# Patient Record
Sex: Male | Born: 1960 | Race: Black or African American | Hispanic: No | Marital: Single | Smoking: Former smoker
Health system: Southern US, Community
[De-identification: ages and names within clinical notes are randomized; demographics above are authoritative.]

## PROBLEM LIST (undated history)

## (undated) DIAGNOSIS — F1110 Opioid abuse, uncomplicated: Secondary | ICD-10-CM

## (undated) DIAGNOSIS — M549 Dorsalgia, unspecified: Secondary | ICD-10-CM

## (undated) DIAGNOSIS — Z720 Tobacco use: Secondary | ICD-10-CM

## (undated) DIAGNOSIS — G8929 Other chronic pain: Secondary | ICD-10-CM

## (undated) DIAGNOSIS — I10 Essential (primary) hypertension: Secondary | ICD-10-CM

## (undated) DIAGNOSIS — F1120 Opioid dependence, uncomplicated: Secondary | ICD-10-CM

## (undated) DIAGNOSIS — F1210 Cannabis abuse, uncomplicated: Secondary | ICD-10-CM

## (undated) DIAGNOSIS — K469 Unspecified abdominal hernia without obstruction or gangrene: Secondary | ICD-10-CM

## (undated) HISTORY — PX: HERNIA REPAIR: SHX51

## (undated) HISTORY — DX: Essential (primary) hypertension: I10

---

## 2008-06-23 ENCOUNTER — Emergency Department (HOSPITAL_COMMUNITY): Admission: EM | Admit: 2008-06-23 | Discharge: 2008-06-24 | Payer: Self-pay | Admitting: Emergency Medicine

## 2010-04-14 ENCOUNTER — Emergency Department (HOSPITAL_COMMUNITY)
Admission: EM | Admit: 2010-04-14 | Discharge: 2010-04-14 | Disposition: A | Discharge status: Home / Self Care | Payer: Self-pay | Attending: Emergency Medicine | Admitting: Emergency Medicine

## 2010-04-14 DIAGNOSIS — M545 Low back pain, unspecified: Secondary | ICD-10-CM | POA: Insufficient documentation

## 2010-04-14 DIAGNOSIS — G8929 Other chronic pain: Secondary | ICD-10-CM | POA: Insufficient documentation

## 2010-04-14 DIAGNOSIS — M542 Cervicalgia: Secondary | ICD-10-CM | POA: Insufficient documentation

## 2010-04-14 DIAGNOSIS — M5430 Sciatica, unspecified side: Secondary | ICD-10-CM | POA: Insufficient documentation

## 2010-04-14 DIAGNOSIS — M25559 Pain in unspecified hip: Secondary | ICD-10-CM | POA: Insufficient documentation

## 2010-05-26 ENCOUNTER — Emergency Department (HOSPITAL_COMMUNITY)
Admission: EM | Admit: 2010-05-26 | Discharge: 2010-05-26 | Disposition: A | Discharge status: Home / Self Care | Payer: Self-pay | Attending: Emergency Medicine | Admitting: Emergency Medicine

## 2010-05-26 DIAGNOSIS — M5430 Sciatica, unspecified side: Secondary | ICD-10-CM | POA: Insufficient documentation

## 2010-05-26 DIAGNOSIS — G8929 Other chronic pain: Secondary | ICD-10-CM | POA: Insufficient documentation

## 2010-05-26 DIAGNOSIS — IMO0002 Reserved for concepts with insufficient information to code with codable children: Secondary | ICD-10-CM | POA: Insufficient documentation

## 2010-05-26 DIAGNOSIS — M546 Pain in thoracic spine: Secondary | ICD-10-CM | POA: Insufficient documentation

## 2010-05-26 DIAGNOSIS — M545 Low back pain, unspecified: Secondary | ICD-10-CM | POA: Insufficient documentation

## 2010-05-26 DIAGNOSIS — M2550 Pain in unspecified joint: Secondary | ICD-10-CM | POA: Insufficient documentation

## 2010-05-29 LAB — URINALYSIS, ROUTINE W REFLEX MICROSCOPIC
Glucose, UA: NEGATIVE mg/dL
Hgb urine dipstick: NEGATIVE
Ketones, ur: NEGATIVE mg/dL
Specific Gravity, Urine: 1.038 — ABNORMAL HIGH (ref 1.005–1.030)
pH: 6 (ref 5.0–8.0)

## 2010-05-29 LAB — DIFFERENTIAL
Eosinophils Absolute: 0.2 10*3/uL (ref 0.0–0.7)
Eosinophils Relative: 3 % (ref 0–5)
Lymphs Abs: 2 10*3/uL (ref 0.7–4.0)
Monocytes Absolute: 0.5 10*3/uL (ref 0.1–1.0)
Monocytes Relative: 6 % (ref 3–12)

## 2010-05-29 LAB — COMPREHENSIVE METABOLIC PANEL
ALT: 23 U/L (ref 0–53)
AST: 21 U/L (ref 0–37)
Albumin: 4.1 g/dL (ref 3.5–5.2)
CO2: 27 mEq/L (ref 19–32)
Calcium: 8.8 mg/dL (ref 8.4–10.5)
GFR calc Af Amer: >60 mL/min (ref >60)
GFR calc non Af Amer: >60 mL/min (ref >60)
Sodium: 140 mEq/L (ref 135–145)

## 2010-05-29 LAB — CBC
MCHC: 33.6 g/dL (ref 30.0–36.0)
Platelets: 269 10*3/uL (ref 150–400)
RBC: 5.01 MIL/uL (ref 4.22–5.81)
WBC: 7.6 10*3/uL (ref 4.0–10.5)

## 2010-05-29 LAB — LIPASE, BLOOD: Lipase: 18 U/L (ref 11–59)

## 2010-07-09 ENCOUNTER — Emergency Department (HOSPITAL_COMMUNITY)
Admission: EM | Admit: 2010-07-09 | Discharge: 2010-07-09 | Disposition: A | Discharge status: Home / Self Care | Payer: Self-pay | Attending: Emergency Medicine | Admitting: Emergency Medicine

## 2010-07-09 DIAGNOSIS — M79609 Pain in unspecified limb: Secondary | ICD-10-CM | POA: Insufficient documentation

## 2010-07-09 DIAGNOSIS — IMO0001 Reserved for inherently not codable concepts without codable children: Secondary | ICD-10-CM | POA: Insufficient documentation

## 2010-07-09 DIAGNOSIS — M549 Dorsalgia, unspecified: Secondary | ICD-10-CM | POA: Insufficient documentation

## 2010-11-28 ENCOUNTER — Emergency Department (HOSPITAL_COMMUNITY)
Admission: EM | Admit: 2010-11-28 | Discharge: 2010-11-28 | Disposition: A | Discharge status: Home / Self Care | Payer: Self-pay | Attending: Emergency Medicine | Admitting: Emergency Medicine

## 2010-11-28 DIAGNOSIS — M545 Low back pain, unspecified: Secondary | ICD-10-CM | POA: Insufficient documentation

## 2010-11-28 DIAGNOSIS — G8929 Other chronic pain: Secondary | ICD-10-CM | POA: Insufficient documentation

## 2010-11-28 DIAGNOSIS — R29898 Other symptoms and signs involving the musculoskeletal system: Secondary | ICD-10-CM | POA: Insufficient documentation

## 2010-11-28 DIAGNOSIS — M533 Sacrococcygeal disorders, not elsewhere classified: Secondary | ICD-10-CM | POA: Insufficient documentation

## 2010-11-28 DIAGNOSIS — M79609 Pain in unspecified limb: Secondary | ICD-10-CM | POA: Insufficient documentation

## 2011-03-01 ENCOUNTER — Encounter (HOSPITAL_COMMUNITY): Payer: Self-pay | Admitting: *Deleted

## 2011-03-01 ENCOUNTER — Emergency Department (HOSPITAL_COMMUNITY)
Admission: EM | Admit: 2011-03-01 | Discharge: 2011-03-01 | Disposition: A | Discharge status: Home / Self Care | Payer: Self-pay | Attending: Emergency Medicine | Admitting: Emergency Medicine

## 2011-03-01 DIAGNOSIS — F17200 Nicotine dependence, unspecified, uncomplicated: Secondary | ICD-10-CM | POA: Insufficient documentation

## 2011-03-01 DIAGNOSIS — M545 Low back pain, unspecified: Secondary | ICD-10-CM | POA: Insufficient documentation

## 2011-03-01 DIAGNOSIS — M79609 Pain in unspecified limb: Secondary | ICD-10-CM | POA: Insufficient documentation

## 2011-03-01 DIAGNOSIS — M549 Dorsalgia, unspecified: Secondary | ICD-10-CM

## 2011-03-01 HISTORY — DX: Other chronic pain: G89.29

## 2011-03-01 HISTORY — DX: Dorsalgia, unspecified: M54.9

## 2011-03-01 MED ORDER — DIAZEPAM 5 MG PO TABS: 5.0000 mg | ORAL_TABLET | Freq: Every day | ORAL | Status: AC

## 2011-03-01 MED ORDER — IBUPROFEN 800 MG PO TABS: 800.0000 mg | ORAL_TABLET | Freq: Three times a day (TID) | ORAL | Status: AC

## 2011-03-01 MED ORDER — IBUPROFEN 800 MG PO TABS
800.0000 mg | ORAL_TABLET | Freq: Once | ORAL | Status: AC
  Administered 2011-03-01: 800 mg via ORAL
  Filled 2011-03-01: qty 1

## 2011-03-01 MED ORDER — HYDROCODONE-ACETAMINOPHEN 5-325 MG PO TABS: 1.0000 | ORAL_TABLET | Freq: Four times a day (QID) | ORAL | Status: AC | PRN

## 2011-03-01 NOTE — ED Notes (Signed)
C/o lower back pain that he has had since 2007 when he fell off a truck

## 2011-03-01 NOTE — ED Provider Notes (Signed)
History     CSN: [id number]  Arrival date & time 03/01/11  2159   First MD Initiated Contact with Patient 03/01/11 2300      Chief Complaint  Patient presents with  . Back Pain    (Consider location/radiation/quality/duration/timing/severity/associated sxs/prior treatment) HPI Patient notes that he has back pain "always" and that today, ~6hr pta, the pain became insidiously worse w increasing distribution.  The pain is focally about the left lower back w radiation inferiorly down the posterior of the ipsilateral leg.  Today the radiation seemed to be more inferior than in the past, and the "burning, cramping" pain was worse.  No attempts at analgesia, and the pain was worse w motion.  No inability to ambulate, no bowel or bladder changes, no distal (new) dysesthesia, No f/c, no abd pain/n/v/d. Past Medical History  Diagnosis Date  . Back pain, chronic     History reviewed. No pertinent past surgical history.  History reviewed. No pertinent family history.  History  Substance Use Topics  . Smoking status: Current Everyday Smoker  . Smokeless tobacco: Not on file  . Alcohol Use: No      Review of Systems  All other systems reviewed and are negative.    Allergies  Review of patient's allergies indicates no known allergies.  Home Medications   Current Outpatient Rx  Name Route Sig Dispense Refill  . NAPROXEN SODIUM 220 MG PO TABS Oral Take 220 mg by mouth 2 (two) times daily with a meal.    . DIAZEPAM 5 MG PO TABS Oral Take 1 tablet (5 mg total) by mouth at bedtime. 5 tablet 0  . HYDROCODONE-ACETAMINOPHEN 5-325 MG PO TABS Oral Take 1 tablet by mouth every 6 (six) hours as needed for pain. 12 tablet 0  . IBUPROFEN 800 MG PO TABS Oral Take 1 tablet (800 mg total) by mouth every 8 (eight) hours. 12 tablet 0    BP 146/85  Pulse 63  Temp(Src) 97.8 F (36.6 C) (Oral)  Resp 20  Ht 6' (1.829 m)  Wt 237 lb (107.502 kg)  BMI 32.14 kg/m2  SpO2 98%  Physical Exam    Nursing note and vitals reviewed. Constitutional: He is oriented to person, place, and time. He appears well-developed. No distress.  HENT:  Head: Normocephalic and atraumatic.  Eyes: Conjunctivae and EOM are normal.  Cardiovascular: Normal rate and regular rhythm.   Pulmonary/Chest: Effort normal. No stridor. No respiratory distress.  Abdominal: He exhibits no distension.  Musculoskeletal: He exhibits no edema.       Back:       ROM and strength in each LE is sym and appropriate, though there is pain elicited w hip flex>ext on L.  Neurological: He is alert and oriented to person, place, and time.  Skin: Skin is warm and dry.  Psychiatric: He has a normal mood and affect.    ED Course  Procedures (including critical care time)  Labs Reviewed - No data to display No results found.   1. Back pain       MDM  This patient with chronic low back pain now p/w one day of worsening complaints.  On exam the patient is in no distress, w appropriate ROM and strength of his LE.  Given the denial of neuro complaints or findings, the patient's complaint is most likely acute on chronic musculoskeletal low back pain.  Long term options for relief were discussed at length with the patient (PT, ortho eval, exercises, analgesics) and  he was d/c w a short course of medications and PT referral.         Gerhard Munch, MD 03/01/11 [phone]

## 2011-03-01 NOTE — ED Notes (Signed)
Pt reports having chronic lower back pain "since 2000". Pt MAE x 4, INAD, states "pain was just worst today".

## 2011-06-02 ENCOUNTER — Encounter (HOSPITAL_COMMUNITY): Payer: Self-pay | Admitting: Emergency Medicine

## 2011-06-02 ENCOUNTER — Emergency Department (HOSPITAL_COMMUNITY)
Admission: EM | Admit: 2011-06-02 | Discharge: 2011-06-02 | Disposition: A | Discharge status: Home / Self Care | Payer: Medicaid Other | Attending: Emergency Medicine | Admitting: Emergency Medicine

## 2011-06-02 DIAGNOSIS — G8929 Other chronic pain: Secondary | ICD-10-CM | POA: Insufficient documentation

## 2011-06-02 DIAGNOSIS — M545 Low back pain, unspecified: Secondary | ICD-10-CM | POA: Insufficient documentation

## 2011-06-02 DIAGNOSIS — M549 Dorsalgia, unspecified: Secondary | ICD-10-CM

## 2011-06-02 DIAGNOSIS — M79609 Pain in unspecified limb: Secondary | ICD-10-CM | POA: Insufficient documentation

## 2011-06-02 DIAGNOSIS — F17200 Nicotine dependence, unspecified, uncomplicated: Secondary | ICD-10-CM | POA: Insufficient documentation

## 2011-06-02 MED ORDER — OXYCODONE-ACETAMINOPHEN 5-325 MG PO TABS: 1.0000 | ORAL_TABLET | Freq: Four times a day (QID) | ORAL | Status: AC | PRN

## 2011-06-02 MED ORDER — PREDNISONE 20 MG PO TABS: 40.0000 mg | ORAL_TABLET | Freq: Every day | ORAL | Status: AC

## 2011-06-02 MED ORDER — METHOCARBAMOL 500 MG PO TABS: 1000.0000 mg | ORAL_TABLET | Freq: Four times a day (QID) | ORAL | Status: AC

## 2011-06-02 NOTE — ED Notes (Signed)
C/o chronic lower back pain since 2007.  States pain radiates to posterior L leg. Pain worse over past couple of days.

## 2011-06-02 NOTE — ED Provider Notes (Signed)
Medical screening examination/treatment/procedure(s) were performed by non-physician practitioner and as supervising physician I was immediately available for consultation/collaboration.  Karan Inclan R. Aliciana Ricciardi, MD 06/02/11 1956 

## 2011-06-02 NOTE — ED Provider Notes (Signed)
History     CSN: [id number]  Arrival date & time 06/02/11  1735   First MD Initiated Contact with Patient 06/02/11 1814      Chief Complaint  Patient presents with  . Back Pain    (Consider location/radiation/quality/duration/timing/severity/associated sxs/prior treatment) HPI Comments: Patient with history of chronic back pain presents with flare of his typical symptoms with radiation into his left leg to left calf. Pain was worse since this morning. No new injuries. Patient denies red flag signs and symptoms of low back pain. Patient sometimes takes Aleve but this is not helping. No other treatments. Patient states he is looking for a primary care physician now that he has disability.   Patient is a 51 y.o. male presenting with back pain. The history is provided by the patient.  Back Pain  This is a chronic problem. The current episode started 6 to 12 hours ago. The problem occurs constantly. The problem has not changed since onset.The pain is associated with no known injury. The pain is present in the lumbar spine. The quality of the pain is described as shooting. The pain radiates to the left thigh and left knee. The symptoms are aggravated by certain positions and bending (palpation). Pertinent negatives include no fever, no numbness, no weight loss, no bowel incontinence, no perianal numbness, no bladder incontinence, no dysuria, no paresthesias and no weakness. He has tried NSAIDs for the symptoms.    Past Medical History  Diagnosis Date  . Back pain, chronic     History reviewed. No pertinent past surgical history.  No family history on file.  History  Substance Use Topics  . Smoking status: Current Everyday Smoker  . Smokeless tobacco: Not on file  . Alcohol Use: No      Review of Systems  Constitutional: Negative for fever, weight loss and unexpected weight change.  Gastrointestinal: Negative for constipation and bowel incontinence.       Neg for fecal  incontinence  Genitourinary: Negative for bladder incontinence, dysuria, hematuria, flank pain and difficulty urinating.       Negative for urinary incontinence or retention  Musculoskeletal: Positive for back pain.  Neurological: Negative for weakness, numbness and paresthesias.       Negative for saddle paresthesias     Allergies  Review of patient's allergies indicates no known allergies.  Home Medications   Current Outpatient Rx  Name Route Sig Dispense Refill  . NAPROXEN SODIUM 220 MG PO TABS Oral Take 220 mg by mouth 2 (two) times daily with a meal.      BP 167/82  Pulse 65  Temp(Src) 98.1 F (36.7 C) (Oral)  Resp 18  SpO2 99%  Physical Exam  Nursing note and vitals reviewed. Constitutional: He is oriented to person, place, and time. He appears well-developed and well-nourished.  HENT:  Head: Normocephalic and atraumatic.  Eyes: Conjunctivae are normal.  Neck: Normal range of motion.  Abdominal: Soft. There is no tenderness. There is no CVA tenderness.  Musculoskeletal: Normal range of motion. He exhibits no tenderness.       There is no tenderness to palpation over cervical/thoracic/lumbar/sacral spine. Tenderness to palpation over L lumbar paraspinal muscles. No step-off noted with palpation of spine.   Neurological: He is alert and oriented to person, place, and time. He has normal reflexes. No sensory deficit. He exhibits normal muscle tone.       5/5 strength in entire lower extremities bilaterally. No sensation deficit.   Skin: Skin is warm and  dry.  Psychiatric: He has a normal mood and affect.    ED Course  Procedures (including critical care time)  Labs Reviewed - No data to display No results found.   1. Back pain     7:06 PM Patient seen and examined. Patient refuses pain medication because he is driving home.  Vital signs reviewed and are as follows: Filed Vitals:   06/02/11 1738  BP: 167/82  Pulse: 65  Temp: 98.1 F (36.7 C)  Resp: 18    7:06 PM Patient was seen and examined. No red flag s/s of low back pain. Patient was counseled on back pain precautions and told to do activity as tolerated but do not lift, push, or pull heavy objects more than 10 pounds for the next week.  Patient counseled to use ice or heat on back for no longer than 15 minutes every hour.   Patient prescribed muscle relaxer and counseled on proper use of muscle relaxant medication.    Patient prescribed narcotic pain medicine and counseled on proper use of narcotic pain medications. Counseled not to combine this medication with others containing tylenol.   Urged patient not to drink alcohol, drive, or perform any other activities that requires focus while taking either of these medications.  Prescribed prednisone.   Patient urged to follow-up with PCP if pain does not improve with treatment and rest or if pain becomes recurrent. Urged to return with worsening severe pain, loss of bowel or bladder control, trouble walking.   Orthopedic and neurosurg referrals given.   The patient verbalizes understanding and agrees with the plan.   MDM  Patient with back pain. No neurological deficits. Patient is ambulatory. No warning symptoms of back pain including: loss of bowel or bladder control, night sweats, waking from sleep with back pain, unexplained fevers or weight loss, h/o cancer, IVDU, recent trauma. No concern for cauda equina, epidural abscess, or other serious cause of back pain. Conservative measures such as rest, ice/heat and pain medicine indicated with PCP follow-up if no improvement with conservative management.          Renne Crigler, [place] 06/02/11 1907

## 2011-06-02 NOTE — Discharge Instructions (Signed)
Please read and follow all provided instructions.  Your diagnoses today include:  1. Back pain     Tests performed today include:  Vital signs - see below for your results today  Medications prescribed:   Percocet (oxycodone/acetaminophen) - narcotic pain medication   You have been prescribed narcotic pain medication such as Vicodin or Percocet: DO NOT drive or perform any activities that require you to be awake and alert because this medicine can make you drowsy. BE VERY CAREFUL not to take multiple medicines containing Tylenol (also called acetaminophen). Doing so can lead to an overdose which can damage your liver and cause liver failure and possibly death.    Robaxin (methocarbamol) - muscle relaxer medication  You have been prescribed a muscle relaxer medication such as Robaxin, Flexeril, or Valium: DO NOT drive or perform any activities that require you to be awake and alert because this medicine can make you drowsy.    Prednisone - anti-inflammation medication   Naproxen - anti-inflammatory pain medication  Do not exceed 500mg  naproxen every 12 hours  You have been prescribed an anti-inflammatory medication or NSAID. Take with food. Take smallest effective dose for the shortest duration needed for your pain. Stop taking if you experience stomach pain or vomiting.   Take any prescribed medications only as directed.  Home care instructions:   Follow any educational materials contained in this packet  Please rest, use ice or heat on your back for the next several days  Do not lift, push, pull anything more than 10 pounds for the next week  Follow-up instructions: Please follow-up with your primary care provider in the next 1 week for further evaluation of your symptoms. If you do not have a primary care doctor -- see below for referral information.   Return instructions:  SEEK IMMEDIATE MEDICAL ATTENTION IF YOU HAVE:  New numbness, tingling, weakness, or problem  with the use of your arms or legs  Severe back pain not relieved with medications  Loss control of your bowels or bladder  Increasing pain in any areas of the body (such as chest or abdominal pain)  Shortness of breath, dizziness, or fainting.   Worsening nausea (feeling sick to your stomach), vomiting, fever, or sweats  Any other emergent concerns regarding your health   Additional Information:  Your vital signs today were: BP 167/82  Pulse 65  Temp(Src) 98.1 F (36.7 C) (Oral)  Resp 18  SpO2 99% If your blood pressure (BP) was elevated above 135/85 this visit, please have this repeated by your doctor within one month. -------------- No Primary Care Doctor Call [facility]  [phone] Other agencies that provide inexpensive medical care    [facility]  [phone]    [facility]  [phone]    [facility]  [phone]    [facility]  [phone]    [facility]  [phone]    [facility]  [phone] -------------- RESOURCE GUIDE:  Dental Problems  Patients with Medicaid: [facility] Dental [phone]                                            [phone] [address] [employer] [address] Phone:  [phone]  Phone:  [phone]  If unable to pay or uninsured, contact:  [facility] or [facility] to become qualified for the adult dental clinic.  Chronic Pain Problems Contact [facility]  [phone] Patients need to be referred by their primary care doctor.  Insufficient Money for Medicine Contact [employer]:  call "211" or [facility] [phone].  Psychological Services [facility] Behavioral Health  [phone] [facility]  [phone] [facility] Mental Health   [phone] (emergency services [phone])  Substance Abuse Resources [facility]  8575834357 [facility] [phone] [facility] [place] [phone] Floydene Flock [phone] Residential & Outpatient Substance Abuse Program  [phone]  Abuse/Neglect [facility] Child Abuse Hotline [phone] [facility] Child Abuse Hotline 484-631-9619 (After Hours)  Emergency Shelter [facility] [phone]  Maternity Homes Room at the [place] [facility] [phone] [place] [facility] [phone]  [facility] Resources  [facility]     [employer]                          [facility] Dept. [address] [place]                       [address]      [address]  Blondell Reveal Phone:  [phone]                                   Phone:  [phone]                 Phone:  [phone]  [facility] Mental Health Phone:  [phone]  [facility] Child Abuse Hotline 8207238909 [phone] (After Hours)

## 2011-07-02 ENCOUNTER — Emergency Department (HOSPITAL_COMMUNITY)
Admission: EM | Admit: 2011-07-02 | Discharge: 2011-07-02 | Disposition: A | Discharge status: Home / Self Care | Payer: Medicaid Other | Attending: Emergency Medicine | Admitting: Emergency Medicine

## 2011-07-02 ENCOUNTER — Encounter (HOSPITAL_COMMUNITY): Payer: Self-pay | Admitting: *Deleted

## 2011-07-02 DIAGNOSIS — R209 Unspecified disturbances of skin sensation: Secondary | ICD-10-CM | POA: Insufficient documentation

## 2011-07-02 DIAGNOSIS — M545 Low back pain, unspecified: Secondary | ICD-10-CM | POA: Insufficient documentation

## 2011-07-02 DIAGNOSIS — G8929 Other chronic pain: Secondary | ICD-10-CM | POA: Insufficient documentation

## 2011-07-02 DIAGNOSIS — Z79899 Other long term (current) drug therapy: Secondary | ICD-10-CM | POA: Insufficient documentation

## 2011-07-02 DIAGNOSIS — R29898 Other symptoms and signs involving the musculoskeletal system: Secondary | ICD-10-CM | POA: Insufficient documentation

## 2011-07-02 DIAGNOSIS — M549 Dorsalgia, unspecified: Secondary | ICD-10-CM

## 2011-07-02 DIAGNOSIS — M546 Pain in thoracic spine: Secondary | ICD-10-CM | POA: Insufficient documentation

## 2011-07-02 HISTORY — DX: Unspecified abdominal hernia without obstruction or gangrene: K46.9

## 2011-07-02 MED ORDER — PREDNISONE 20 MG PO TABS: 60.0000 mg | ORAL_TABLET | Freq: Every day | ORAL | Status: AC

## 2011-07-02 MED ORDER — MELOXICAM 7.5 MG PO TABS: 7.5000 mg | ORAL_TABLET | Freq: Two times a day (BID) | ORAL | Status: DC

## 2011-07-02 MED ORDER — TRAMADOL HCL 50 MG PO TABS
50.0000 mg | ORAL_TABLET | Freq: Once | ORAL | Status: AC
  Administered 2011-07-02: 50 mg via ORAL
  Filled 2011-07-02: qty 1

## 2011-07-02 MED ORDER — PREDNISONE 20 MG PO TABS
60.0000 mg | ORAL_TABLET | Freq: Once | ORAL | Status: AC
  Administered 2011-07-02: 60 mg via ORAL
  Filled 2011-07-02: qty 3

## 2011-07-02 NOTE — ED Provider Notes (Signed)
History     CSN: [id number]  Arrival date & time 07/02/11  1757   None     Chief Complaint  Patient presents with  . Back Pain    (Consider location/radiation/quality/duration/timing/severity/associated sxs/prior treatment) HPI Comments: Patient with a history of chronic back pains since fallen off the back of a truck in 2007 was initially followed by workers comp but was discharged from that plan.  He has since subsequently been seen by 2 local physicians, but currently does not have a physician at this time.  He's been seen 3 times since January in this emergency room.  Has not followed through with any of his referrals he states none of the medicine helps him.  He does take Neurontin at this time.  300 mg twice a day without any relief.  There is no change in the nature of his pain is good days and bad days.  It is in his low thoracic and lumbar region, radiating to his left leg.  He has not lost continence or ability foran erection  Patient is a 51 y.o. male presenting with back pain.  Back Pain  This is a chronic problem. The problem occurs constantly. The problem has not changed since onset.The pain is present in the lumbar spine and thoracic spine. The pain is at a severity of 7/10. The pain is moderate. The symptoms are aggravated by certain positions. Associated symptoms include numbness and weakness. Pertinent negatives include no fever.    Past Medical History  Diagnosis Date  . Back pain, chronic   . Hernia     Rt. Groin    History reviewed. No pertinent past surgical history.  No family history on file.  History  Substance Use Topics  . Smoking status: Current Everyday Smoker  . Smokeless tobacco: Not on file  . Alcohol Use: No      Review of Systems  Constitutional: Negative for fever.  Musculoskeletal: Positive for back pain. Negative for joint swelling.  Skin: Negative for rash.  Neurological: Positive for weakness and numbness. Negative for dizziness.     Allergies  Review of patient's allergies indicates no known allergies.  Home Medications   Current Outpatient Rx  Name Route Sig Dispense Refill  . GABAPENTIN 300 MG PO CAPS Oral Take 300 mg by mouth 2 (two) times daily.    . MELOXICAM 7.5 MG PO TABS Oral Take 1 tablet (7.5 mg total) by mouth 2 (two) times daily. 60 tablet 2  . PREDNISONE 20 MG PO TABS Oral Take 3 tablets (60 mg total) by mouth daily. 18 tablet 0    BP 154/73  Pulse 66  Temp(Src) 97.9 F (36.6 C) (Oral)  Resp 20  SpO2 100%  Physical Exam  Constitutional: He is oriented to person, place, and time. He appears well-developed and well-nourished.  HENT:  Head: Normocephalic.  Eyes: Pupils are equal, round, and reactive to light.  Neck: Normal range of motion.  Cardiovascular: Normal rate.   Pulmonary/Chest: Effort normal.  Musculoskeletal:       Patient is slightly weaker on his left leg, but is able to ambulate and drive a vehicle  Neurological: He is alert and oriented to person, place, and time.  Skin: Skin is warm.    ED Course  Procedures (including critical care time)  Labs Reviewed - No data to display No results found.   1. Chronic back pain greater than 3 months duration       MDM  Chronic back pain  Arman Filter, NP 07/02/11 4782  Arman Filter, NP 07/02/11 2049

## 2011-07-02 NOTE — ED Notes (Signed)
NURSE EXPLAINED DELAY / PROCESS.  

## 2011-07-02 NOTE — ED Notes (Signed)
Intermittent, chronic back pain. Pain radiating down rt. Leg.

## 2011-07-02 NOTE — Discharge Instructions (Signed)
As discussed, is very important that you followup with an orthopedic doctor, who can get you into a physical therapy program and further evaluate.  Your back pain is is being several years, since you've been seen by a specialist.  He been referred to Dr. Shon Baton, who is an orthopedic physician with a specialty in the back surgery

## 2011-07-03 NOTE — ED Provider Notes (Signed)
Medical screening examination/treatment/procedure(s) were performed by non-physician practitioner and as supervising physician I was immediately available for consultation/collaboration.   Forbes Cellar, MD 07/03/11 [phone]

## 2012-04-27 ENCOUNTER — Emergency Department (HOSPITAL_COMMUNITY)
Admission: EM | Admit: 2012-04-27 | Discharge: 2012-04-27 | Disposition: A | Discharge status: Home / Self Care | Payer: Self-pay | Attending: Emergency Medicine | Admitting: Emergency Medicine

## 2012-04-27 ENCOUNTER — Encounter (HOSPITAL_COMMUNITY): Payer: Self-pay

## 2012-04-27 DIAGNOSIS — M549 Dorsalgia, unspecified: Secondary | ICD-10-CM | POA: Insufficient documentation

## 2012-04-27 DIAGNOSIS — R51 Headache: Secondary | ICD-10-CM | POA: Insufficient documentation

## 2012-04-27 DIAGNOSIS — Z79899 Other long term (current) drug therapy: Secondary | ICD-10-CM | POA: Insufficient documentation

## 2012-04-27 DIAGNOSIS — Z8719 Personal history of other diseases of the digestive system: Secondary | ICD-10-CM | POA: Insufficient documentation

## 2012-04-27 DIAGNOSIS — G8929 Other chronic pain: Secondary | ICD-10-CM | POA: Insufficient documentation

## 2012-04-27 DIAGNOSIS — I10 Essential (primary) hypertension: Secondary | ICD-10-CM | POA: Insufficient documentation

## 2012-04-27 DIAGNOSIS — F17200 Nicotine dependence, unspecified, uncomplicated: Secondary | ICD-10-CM | POA: Insufficient documentation

## 2012-04-27 MED ORDER — HYDROCHLOROTHIAZIDE 25 MG PO TABS: 25.0000 mg | ORAL_TABLET | Freq: Every day | ORAL | Status: DC

## 2012-04-27 MED ORDER — LISINOPRIL 20 MG PO TABS: 20.0000 mg | ORAL_TABLET | Freq: Every day | ORAL | Status: DC

## 2012-04-27 NOTE — ED Provider Notes (Addendum)
History     CSN: [id number]  Arrival date & time 04/27/12  2841   None     Chief Complaint  Patient presents with  . Headache    (Consider location/radiation/quality/duration/timing/severity/associated sxs/prior treatment) Patient is a 52 y.o. male presenting with headaches.  Headache Associated symptoms: back pain    Type of left-sided headache gradual onset 2 days ago. Pain is nonradiating he's been treating himself with ibuprofen with relief. Presently discomfort is minimal. pain is intermittent lasting for one hour at a time. no other associated symptoms no fever no injury no visual changes. No chest pain no weakness Nothing makes symptoms better or worse. Patient also requests medication refill for his blood pressure medicine, lisinopril-HCTZ which he ran out of a week ago, stating that his physician will no longer write for it Past Medical History  Diagnosis Date  . Back pain, chronic   . Hernia     Rt. Groin    History reviewed. No pertinent past surgical history.  No family history on file.  History  Substance Use Topics  . Smoking status: Current Every Day Smoker  . Smokeless tobacco: Not on file  . Alcohol Use: No      Review of Systems  Constitutional: Negative.   Respiratory: Negative.   Cardiovascular: Negative.   Gastrointestinal: Negative.   Musculoskeletal: Positive for back pain and gait problem.       Walks with cane and chronic back pain for the past 7 years since injury  Skin: Negative.   Neurological: Positive for headaches.  Psychiatric/Behavioral: Negative.   All other systems reviewed and are negative.    Allergies  Review of patient's allergies indicates no known allergies.  Home Medications   Current Outpatient Rx  Name  Route  Sig  Dispense  Refill  . gabapentin (NEURONTIN) 300 MG capsule   Oral   Take 300 mg by mouth 2 (two) times daily.         . meloxicam (MOBIC) 7.5 MG tablet   Oral   Take 1 tablet (7.5 mg total) by  mouth 2 (two) times daily.   60 tablet   2     BP 192/89  Temp(Src) 97.4 F (36.3 C) (Oral)  Resp 16  SpO2 99%  Physical Exam  Nursing note and vitals reviewed. Constitutional: He appears well-developed and well-nourished.  HENT:  Head: Normocephalic and atraumatic.  Eyes: Conjunctivae are normal. Pupils are equal, round, and reactive to light.  Neck: Neck supple. No tracheal deviation present. No thyromegaly present.  Cardiovascular: Regular rhythm.   No murmur heard. Mildly bradycardic  Pulmonary/Chest: Effort normal and breath sounds normal.  Abdominal: Soft. Bowel sounds are normal. He exhibits no distension. There is no tenderness.  Musculoskeletal: Normal range of motion. He exhibits no edema and no tenderness.  Neurological: He is alert. Coordination normal.  Walks with slight limp favoring right leg. Romberg normal prior drift normal motor strength 5 over 5 overall  Skin: Skin is warm and dry. No rash noted.  Psychiatric: He has a normal mood and affect.    ED Course  Procedures (including critical care time)  Labs Reviewed - No data to display No results found.   No diagnosis found.   Declines pain medicine MDM  No signs of hypertensive encephalopathy. Bradycardia is asymptomatic. Strongly doubt subarachnoid hemorrhage given atypical symptoms. Plan Tylenol for pain patient advised to stop smoking. Spent 5 minutes counseling patient on smoking cessation Referral resource guide Prescription lisinopril, HCTZ Diagnosis #1  nonspecific headache #2 tobacco abuse #3 hypertension        Doug Sou, MD 04/27/12 1610  Doug Sou, MD 04/27/12 9604  Doug Sou, MD 04/27/12 [phone]

## 2012-04-27 NOTE — ED Notes (Signed)
Pt. Reports right sided HA x2 days. States hasn't taken BP medication in x1 week. Denies N/V, blurred vision. Reports slight dizziness. Alert and oriented x4.

## 2012-05-20 ENCOUNTER — Encounter: Payer: Self-pay | Admitting: Family Medicine

## 2012-05-20 ENCOUNTER — Ambulatory Visit (INDEPENDENT_AMBULATORY_CARE_PROVIDER_SITE_OTHER): Payer: Self-pay | Admitting: Family Medicine

## 2012-05-20 VITALS — BP 122/72 | HR 60 | Temp 98.2°F | Ht 72.0 in | Wt 231.0 lb

## 2012-05-20 DIAGNOSIS — Z7189 Other specified counseling: Secondary | ICD-10-CM

## 2012-05-20 DIAGNOSIS — Z9181 History of falling: Secondary | ICD-10-CM | POA: Insufficient documentation

## 2012-05-20 DIAGNOSIS — M549 Dorsalgia, unspecified: Secondary | ICD-10-CM

## 2012-05-20 DIAGNOSIS — G8929 Other chronic pain: Secondary | ICD-10-CM

## 2012-05-20 DIAGNOSIS — Z716 Tobacco abuse counseling: Secondary | ICD-10-CM | POA: Insufficient documentation

## 2012-05-20 DIAGNOSIS — F17200 Nicotine dependence, unspecified, uncomplicated: Secondary | ICD-10-CM

## 2012-05-20 DIAGNOSIS — I10 Essential (primary) hypertension: Secondary | ICD-10-CM

## 2012-05-20 MED ORDER — LISINOPRIL-HYDROCHLOROTHIAZIDE 20-25 MG PO TABS: 1.0000 | ORAL_TABLET | Freq: Every day | ORAL | Status: DC

## 2012-05-20 NOTE — Assessment & Plan Note (Signed)
Managed by pain clinic under pain contract. Patient is aware that we do not refill narcotic pain medication. Otherwise, doing well.

## 2012-05-20 NOTE — Assessment & Plan Note (Signed)
Given 1-800-QUITNOW number and discussed smoking with Arlys John, health coach. Pt appears to be ready to quit.

## 2012-05-20 NOTE — Patient Instructions (Signed)
It was nice to meet you today! Please let your name at the front desk to discuss the [employer] with Britta Mccreedy.   Come back to see me in 3 months to follow up on your blood pressure and check your labwork.  Let me know if you need anything before then!  Take care! Chinenye Katzenberger M. Belem Hintze, M.D.

## 2012-05-20 NOTE — Assessment & Plan Note (Signed)
BP recheck at goal. Will refill Lisinopril/HCTZ. Pt does not have health insurance at this time. WIll leave number for orange card, but anticipated to get Medicare in August. Will need Bmet and CBC soon for evaluation of chronic medication use.  Also, pt has bradycardia and states he had echo in the past at his previous clinic. Will sign ROI today to get these records.

## 2012-05-20 NOTE — Assessment & Plan Note (Signed)
Pt has had multiple falls due to leg weakness. Will have him assessed by health coach for [facility].

## 2012-05-20 NOTE — Progress Notes (Signed)
Patient ID: Stephen Lambert, male   DOB: [birth year], 52 y.o.   MRN: [id number]  [facility] Kamyiah Colantonio M. Cicily Bonano, MD Phone: [phone]   Subjective: HPI: Patient is a 52 y.o. male presenting to clinic today for new patient appointment. Concerns today include: medication refills  1. Hypertension Blood pressure at home: high at home when he checks due to symptoms Blood pressure today: 140/80 Taking Meds: Lisinopril and HCTZ daily Side effects: None, been on medications for one year ROS: Denies headache, visual changes, nausea, vomiting, chest pain, abdominal pain or shortness of breath.  2. Smoking Smokes 5 cigarettes per day. Smoked for total of 30 years, quit for a few years about 10 years ago. Interested in quitting, but never tried anything.  3. Chronic pain Lower and middle back pain, radiates to left hip and left leg. Managed by [facility]. Treated with Oxycodone 15mg  (under pain contract.) Doing home exercises and massaging. States he has good and bad days but overall has a pretty good control over pain. This pain causes weakness in legs and he has fallen 3 times in the last year due to this. He has fallen in the shower, down the stairs and an additional time at pain. Walks with a cane at times, but does not always use it at home.  History Reviewed: Current everyday smoker. Non drinker, no drug use.  Health Maintenance:  Flu shot- Always declines Labs- 2013 at [facility] Colonoscopy- Never had Pneumonia vaccine- Never had  ROS:  Denies HA, visual changes, congestion, chest pain, SOB, abd pain, dysuria, N/V/D, rash Endorses back and leg pain with numbness/tingling.  Objective: Office vital signs reviewed. There were no vitals taken for this visit.  Physical Examination:  General: Awake, alert. NAD. Wife at bedside. HEENT: Atraumatic, normocephalic. MMM. No pharyngeal edema or erythema. PERRA Neck: No masses palpated. No  LAD Pulm: CTAB, no wheezes. Good effort Cardio: RRR, no murmurs appreciated Abdomen:+BS, soft, nontender, nondistended.  Extremities: No edema. 5/5 strength upper extremities. 4/5 strength lower extremities. Unable to perform straight leg raise due to pain. Neuro: Grossly intact, but not formally tested.  Assessment: 52 y.o. male new patient appointment  Plan: See Problem List and After Visit Summary

## 2013-01-17 ENCOUNTER — Other Ambulatory Visit: Payer: Self-pay | Admitting: Family Medicine

## 2013-10-04 ENCOUNTER — Other Ambulatory Visit: Payer: Self-pay | Admitting: *Deleted

## 2013-10-05 MED ORDER — LISINOPRIL-HYDROCHLOROTHIAZIDE 20-25 MG PO TABS: ORAL_TABLET | ORAL | Status: DC

## 2013-11-10 ENCOUNTER — Other Ambulatory Visit: Payer: Self-pay | Admitting: Family Medicine

## 2013-11-15 ENCOUNTER — Encounter: Payer: Self-pay | Admitting: Family Medicine

## 2013-11-15 ENCOUNTER — Ambulatory Visit (INDEPENDENT_AMBULATORY_CARE_PROVIDER_SITE_OTHER): Payer: Medicare Other | Admitting: Family Medicine

## 2013-11-15 VITALS — BP 121/77 | HR 56 | Temp 98.3°F | Wt 228.0 lb

## 2013-11-15 DIAGNOSIS — Z7189 Other specified counseling: Secondary | ICD-10-CM

## 2013-11-15 DIAGNOSIS — Z716 Tobacco abuse counseling: Secondary | ICD-10-CM

## 2013-11-15 DIAGNOSIS — F17200 Nicotine dependence, unspecified, uncomplicated: Secondary | ICD-10-CM

## 2013-11-15 DIAGNOSIS — I10 Essential (primary) hypertension: Secondary | ICD-10-CM

## 2013-11-15 DIAGNOSIS — Z23 Encounter for immunization: Secondary | ICD-10-CM

## 2013-11-15 MED ORDER — LISINOPRIL-HYDROCHLOROTHIAZIDE 20-25 MG PO TABS: ORAL_TABLET | ORAL | Status: DC

## 2013-11-15 NOTE — Progress Notes (Signed)
Patient ID: Stephen Lambert, male   DOB: [birth year], 53 y.o.   MRN: [id number] Subjective:   CC: Med refill for BP  HPI:   This is a 53 y.o. male with h/o HTN on lisinopril-HCTZ. Checks BP at drug store twice monthly and getting 120s/70-80s. Taking lisinopril-HCTZ daily in the morning. No SE. Before started medication 3-4 years ago was having high BP but no symptoms. Denies chest pain, shortness of breath, leg swelling, dizziness, fainting, or blurred vision. Normal voiding. Tries to watch what he eats, vegetables (2 per day (salad, stir fry), fried chicken, mostly water, occasionally dr pepper 10 cal, lipton tea, sometimes). Does not get much exercise. Walks with cane. Walks up sidewalk and back down (120 feet, ~10 minutes a day, four times weekly.)   Review of Systems - Per HPI. Additionally, has occasional back pain if weather bad.  PMH: HTN, tobacco abuse, chronic back pain, high risk for falls SH: lives with girlfriend Smoking status: Smokes 5 cig/day. Smoked since 2001, stopped for about 3 years, altogether smoked 25 years total. Wants to quit.     Objective:  Physical Exam BP 121/77  Pulse 56  Temp(Src) 98.3 F (36.8 C) (Oral)  Wt 228 lb (103.42 kg)  SpO2 98% GEN: NAD CV: RRR, no m/r/g, 2+ bilateral radial pulses PULM: CTAB, normal effort EXTR: No LE edema HEENT: EOMI  Assessment:     Stephen Lambert is a 53 y.o. male with h/o HTN here for follow up and medication refill.    Plan:     # See problem list and after visit summary for problem-specific plans.   # Health Maintenance: Not discussed  Follow-up: Follow up in 6 months or sooner PRN for f/u of HTN.    Leona Singleton, MD [facility]

## 2013-11-15 NOTE — Patient Instructions (Signed)
Great to meet you!  Your BP looks great. Keep checking it a few times a month and remembering these values. Also check if you have dizziness or feel faint. Work on making sure your diet is healthy. Increase exercise by 1-2 more days weekly. Staying active helps with back pain and with your general health. If you have chest pain, trouble breathing, or any other concerns, seek immediate care. We are checking labs today and I will call if any are NOT normal. Follow up with me in 6 months or sooner if needed.  Make an appointment with pharmacy clinic when available to discuss smoking cessation.  DASH Eating Plan DASH stands for "Dietary Approaches to Stop Hypertension." The DASH eating plan is a healthy eating plan that has been shown to reduce high blood pressure (hypertension). Additional health benefits may include reducing the risk of type 2 diabetes mellitus, heart disease, and stroke. The DASH eating plan may also help with weight loss. WHAT DO I NEED TO KNOW ABOUT THE DASH EATING PLAN? For the DASH eating plan, you will follow these general guidelines:  Choose foods with a percent daily value for sodium of less than 5% (as listed on the food label).  Use salt-free seasonings or herbs instead of table salt or sea salt.  Check with your health care provider or pharmacist before using salt substitutes.  Eat lower-sodium products, often labeled as "lower sodium" or "no salt added."  Eat fresh foods.  Eat more vegetables, fruits, and low-fat dairy products.  Choose whole grains. Look for the word "whole" as the first word in the ingredient list.  Choose fish and skinless chicken or [place] more often than red meat. Limit fish, poultry, and meat to 6 oz (170 g) each day.  Limit sweets, desserts, sugars, and sugary drinks.  Choose heart-healthy fats.  Limit cheese to 1 oz (28 g) per day.  Eat more home-cooked food and less restaurant, buffet, and fast food.  Limit fried  foods.  Cook foods using methods other than frying.  Limit canned vegetables. If you do use them, rinse them well to decrease the sodium.  When eating at a restaurant, ask that your food be prepared with less salt, or no salt if possible. WHAT FOODS CAN I EAT? Seek help from a dietitian for individual calorie needs. Grains Whole grain or whole wheat bread. Brown rice. Whole grain or whole wheat pasta. Quinoa, bulgur, and whole grain cereals. Low-sodium cereals. Corn or whole wheat flour tortillas. Whole grain cornbread. Whole grain crackers. Low-sodium crackers. Vegetables Fresh or frozen vegetables (raw, steamed, roasted, or grilled). Low-sodium or reduced-sodium tomato and vegetable juices. Low-sodium or reduced-sodium tomato sauce and paste. Low-sodium or reduced-sodium canned vegetables.  Fruits All fresh, canned (in natural juice), or frozen fruits. Meat and Other Protein Products Ground beef (85% or leaner), grass-fed beef, or beef trimmed of fat. Skinless chicken or [place]. Ground chicken or [place]. Pork trimmed of fat. All fish and seafood. Eggs. Dried beans, peas, or lentils. Unsalted nuts and seeds. Unsalted canned beans. Dairy Low-fat dairy products, such as skim or 1% milk, 2% or reduced-fat cheeses, low-fat ricotta or cottage cheese, or plain low-fat yogurt. Low-sodium or reduced-sodium cheeses. Fats and Oils Tub margarines without trans fats. Light or reduced-fat mayonnaise and salad dressings (reduced sodium). Avocado. Safflower, olive, or canola oils. Natural peanut or almond butter. Other Unsalted popcorn and pretzels. The items listed above may not be a complete list of recommended foods or beverages. Contact your dietitian  for more options. WHAT FOODS ARE NOT RECOMMENDED? Grains White bread. White pasta. White rice. Refined cornbread. Bagels and croissants. Crackers that contain trans fat. Vegetables Creamed or fried vegetables. Vegetables in a cheese sauce. Regular  canned vegetables. Regular canned tomato sauce and paste. Regular tomato and vegetable juices. Fruits Dried fruits. Canned fruit in light or heavy syrup. Fruit juice. Meat and Other Protein Products Fatty cuts of meat. Ribs, chicken wings, bacon, sausage, bologna, salami, chitterlings, fatback, hot dogs, bratwurst, and packaged luncheon meats. Salted nuts and seeds. Canned beans with salt. Dairy Whole or 2% milk, cream, half-and-half, and cream cheese. Whole-fat or sweetened yogurt. Full-fat cheeses or blue cheese. Nondairy creamers and whipped toppings. Processed cheese, cheese spreads, or cheese curds. Condiments Onion and garlic salt, seasoned salt, table salt, and sea salt. Canned and packaged gravies. Worcestershire sauce. Tartar sauce. Barbecue sauce. Teriyaki sauce. Soy sauce, including reduced sodium. Steak sauce. Fish sauce. Oyster sauce. Cocktail sauce. Horseradish. Ketchup and mustard. Meat flavorings and tenderizers. Bouillon cubes. Hot sauce. Tabasco sauce. Marinades. Taco seasonings. Relishes. Fats and Oils Butter, stick margarine, lard, shortening, ghee, and bacon fat. Coconut, palm kernel, or palm oils. Regular salad dressings. Other Pickles and olives. Salted popcorn and pretzels. The items listed above may not be a complete list of foods and beverages to avoid. Contact your dietitian for more information. WHERE CAN I FIND MORE INFORMATION? National Heart, Lung, and Blood Institute: [employer] Document Released: 01/24/2011 Document Revised: 06/21/2013 Document Reviewed: 12/09/2012 [facility] Patient Information 2015 [place], [place]. This information is not intended to replace advice given to you by your health care provider. Make sure you discuss any questions you have with your health care provider.  Best,  Leona Singleton, MD

## 2013-11-16 LAB — BASIC METABOLIC PANEL
BUN: 13 mg/dL (ref 6–23)
CALCIUM: 9.6 mg/dL (ref 8.4–10.5)
CO2: 31 mEq/L (ref 19–32)
Chloride: 101 mEq/L (ref 96–112)
Creat: 1.09 mg/dL (ref 0.50–1.35)
Glucose, Bld: 87 mg/dL (ref 70–99)
Potassium: 3.8 mEq/L (ref 3.5–5.3)
SODIUM: 140 meq/L (ref 135–145)

## 2013-11-16 NOTE — Assessment & Plan Note (Signed)
Well-controlled. AFVSS with normal BP in clinic and at home. - BMET - Increase exercise, hard right now with back pain. Increase to 5-6 times weekly walking 10 minutes each time. - DASH diet printout - Quitline information provided. - Make appt with pharmacy clinic to help with smoking cessation. - refill lisin-HCTZ 6 mo and f/u 6 mo

## 2013-11-16 NOTE — Assessment & Plan Note (Signed)
5 cig/day, contemplational. Briefly discussed medication options including nicotine patch. - QUITline information provided. - Pt would like to set up appt with Pharmacy clinic to help come up with plan and present medication options prior to deciding.

## 2014-05-12 ENCOUNTER — Other Ambulatory Visit: Payer: Self-pay | Admitting: *Deleted

## 2014-05-12 DIAGNOSIS — I10 Essential (primary) hypertension: Secondary | ICD-10-CM

## 2014-05-12 MED ORDER — LISINOPRIL-HYDROCHLOROTHIAZIDE 20-25 MG PO TABS: ORAL_TABLET | ORAL | Status: DC

## 2014-06-11 ENCOUNTER — Other Ambulatory Visit: Payer: Self-pay | Admitting: Family Medicine

## 2014-08-04 ENCOUNTER — Other Ambulatory Visit: Payer: Self-pay | Admitting: Family Medicine

## 2014-08-09 ENCOUNTER — Encounter (HOSPITAL_COMMUNITY): Payer: Self-pay | Admitting: Nurse Practitioner

## 2014-08-09 ENCOUNTER — Emergency Department (HOSPITAL_COMMUNITY)
Admission: EM | Admit: 2014-08-09 | Discharge: 2014-08-09 | Disposition: A | Discharge status: Home / Self Care | Payer: Medicare Other | Attending: Emergency Medicine | Admitting: Emergency Medicine

## 2014-08-09 ENCOUNTER — Emergency Department (HOSPITAL_COMMUNITY): Payer: Medicare Other

## 2014-08-09 DIAGNOSIS — Z720 Tobacco use: Secondary | ICD-10-CM | POA: Insufficient documentation

## 2014-08-09 DIAGNOSIS — Z8719 Personal history of other diseases of the digestive system: Secondary | ICD-10-CM | POA: Diagnosis not present

## 2014-08-09 DIAGNOSIS — W01198A Fall on same level from slipping, tripping and stumbling with subsequent striking against other object, initial encounter: Secondary | ICD-10-CM | POA: Insufficient documentation

## 2014-08-09 DIAGNOSIS — R0602 Shortness of breath: Secondary | ICD-10-CM | POA: Diagnosis not present

## 2014-08-09 DIAGNOSIS — R0789 Other chest pain: Secondary | ICD-10-CM

## 2014-08-09 DIAGNOSIS — I10 Essential (primary) hypertension: Secondary | ICD-10-CM | POA: Diagnosis not present

## 2014-08-09 DIAGNOSIS — S2231XA Fracture of one rib, right side, initial encounter for closed fracture: Secondary | ICD-10-CM

## 2014-08-09 DIAGNOSIS — Y998 Other external cause status: Secondary | ICD-10-CM | POA: Diagnosis not present

## 2014-08-09 DIAGNOSIS — G8929 Other chronic pain: Secondary | ICD-10-CM | POA: Insufficient documentation

## 2014-08-09 DIAGNOSIS — Z79899 Other long term (current) drug therapy: Secondary | ICD-10-CM | POA: Insufficient documentation

## 2014-08-09 DIAGNOSIS — S2241XA Multiple fractures of ribs, right side, initial encounter for closed fracture: Secondary | ICD-10-CM | POA: Insufficient documentation

## 2014-08-09 DIAGNOSIS — Y9389 Activity, other specified: Secondary | ICD-10-CM | POA: Insufficient documentation

## 2014-08-09 DIAGNOSIS — Y9289 Other specified places as the place of occurrence of the external cause: Secondary | ICD-10-CM | POA: Diagnosis not present

## 2014-08-09 DIAGNOSIS — S299XXA Unspecified injury of thorax, initial encounter: Secondary | ICD-10-CM | POA: Diagnosis present

## 2014-08-09 NOTE — Discharge Instructions (Signed)
Chest Pain (Nonspecific) °It is often hard to give a specific diagnosis for the cause of chest pain. There is always a chance that your pain could be related to something serious, such as a heart attack or a blood clot in the lungs. You need to follow up with your health care provider for further evaluation. °CAUSES  °· Heartburn. °· Pneumonia or bronchitis. °· Anxiety or stress. °· Inflammation around your heart (pericarditis) or lung (pleuritis or pleurisy). °· A blood clot in the lung. °· A collapsed lung (pneumothorax). It can develop suddenly on its own (spontaneous pneumothorax) or from trauma to the chest. °· Shingles infection (herpes zoster virus). °The chest wall is composed of bones, muscles, and cartilage. Any of these can be the source of the pain. °· The bones can be bruised by injury. °· The muscles or cartilage can be strained by coughing or overwork. °· The cartilage can be affected by inflammation and become sore (costochondritis). °DIAGNOSIS  °Lab tests or other studies may be needed to find the cause of your pain. Your health care provider may have you take a test called an ambulatory electrocardiogram (ECG). An ECG records your heartbeat patterns over a 24-hour period. You may also have other tests, such as: °· Transthoracic echocardiogram (TTE). During echocardiography, sound waves are used to evaluate how blood flows through your heart. °· Transesophageal echocardiogram (TEE). °· Cardiac monitoring. This allows your health care provider to monitor your heart rate and rhythm in real time. °· Holter monitor. This is a portable device that records your heartbeat and can help diagnose heart arrhythmias. It allows your health care provider to track your heart activity for several days, if needed. °· Stress tests by exercise or by giving medicine that makes the heart beat faster. °TREATMENT  °· Treatment depends on what may be causing your chest pain. Treatment may include: °· Acid blockers for  heartburn. °· Anti-inflammatory medicine. °· Pain medicine for inflammatory conditions. °· Antibiotics if an infection is present. °· You may be advised to change lifestyle habits. This includes stopping smoking and avoiding alcohol, caffeine, and chocolate. °· You may be advised to keep your head raised (elevated) when sleeping. This reduces the chance of acid going backward from your stomach into your esophagus. °Most of the time, nonspecific chest pain will improve within 2-3 days with rest and mild pain medicine.  °HOME CARE INSTRUCTIONS  °· If antibiotics were prescribed, take them as directed. Finish them even if you start to feel better. °· For the next few days, avoid physical activities that bring on chest pain. Continue physical activities as directed. °· Do not use any tobacco products, including cigarettes, chewing tobacco, or electronic cigarettes. °· Avoid drinking alcohol. °· Only take medicine as directed by your health care provider. °· Follow your health care provider's suggestions for further testing if your chest pain does not go away. °· Keep any follow-up appointments you made. If you do not go to an appointment, you could develop lasting (chronic) problems with pain. If there is any problem keeping an appointment, call to reschedule. °SEEK MEDICAL CARE IF:  °· Your chest pain does not go away, even after treatment. °· You have a rash with blisters on your chest. °· You have a fever. °SEEK IMMEDIATE MEDICAL CARE IF:  °· You have increased chest pain or pain that spreads to your arm, neck, jaw, back, or abdomen. °· You have shortness of breath. °· You have an increasing cough, or you cough   up blood.  You have severe back or abdominal pain.  You feel nauseous or vomit.  You have severe weakness.  You faint.  You have chills. This is an emergency. Do not wait to see if the pain will go away. Get medical help at once. Call your local emergency services (911 in [place] Do not drive  yourself to the hospital. MAKE SURE YOU:   Understand these instructions.  Will watch your condition.  Will get help right away if you are not doing well or get worse. Document Released: 11/14/2004 Document Revised: 02/09/2013 Document Reviewed: 09/10/2007 [facility] Patient Information 2015 [place], [place]. This information is not intended to replace advice given to you by your health care provider. Make sure you discuss any questions you have with your health care provider.  Rib Fracture A rib fracture is a break or crack in one of the bones of the ribs. The ribs are a group of long, curved bones that wrap around your chest and attach to your spine. They protect your lungs and other organs in the chest cavity. A broken or cracked rib is often painful, but most do not cause other problems. Most rib fractures heal on their own over time. However, rib fractures can be more serious if multiple ribs are broken or if broken ribs move out of place and push against other structures. CAUSES   A direct blow to the chest. For example, this could happen during contact sports, a car accident, or a fall against a hard object.  Repetitive movements with high force, such as pitching a baseball or having severe coughing spells. SYMPTOMS   Pain when you breathe in or cough.  Pain when someone presses on the injured area. DIAGNOSIS  Your caregiver will perform a physical exam. Various imaging tests may be ordered to confirm the diagnosis and to look for related injuries. These tests may include a chest X-ray, computed tomography (CT), magnetic resonance imaging (MRI), or a bone scan. TREATMENT  Rib fractures usually heal on their own in 1-3 months. The longer healing period is often associated with a continued cough or other aggravating activities. During the healing period, pain control is very important. Medication is usually given to control pain. Hospitalization or surgery may be needed for more severe  injuries, such as those in which multiple ribs are broken or the ribs have moved out of place.  HOME CARE INSTRUCTIONS   Avoid strenuous activity and any activities or movements that cause pain. Be careful during activities and avoid bumping the injured rib.  Gradually increase activity as directed by your caregiver.  Only take over-the-counter or prescription medications as directed by your caregiver. Do not take other medications without asking your caregiver first.  Apply ice to the injured area for the first 1-2 days after you have been treated or as directed by your caregiver. Applying ice helps to reduce inflammation and pain.  Put ice in a plastic bag.  Place a towel between your skin and the bag.   Leave the ice on for 15-20 minutes at a time, every 2 hours while you are awake.  Perform deep breathing as directed by your caregiver. This will help prevent pneumonia, which is a common complication of a broken rib. Your caregiver may instruct you to:  Take deep breaths several times a day.  Try to cough several times a day, holding a pillow against the injured area.  Use a device called an incentive spirometer to practice deep breathing several  times a day.  Drink enough fluids to keep your urine clear or pale yellow. This will help you avoid constipation.   Do not wear a rib belt or binder. These restrict breathing, which can lead to pneumonia.  SEEK IMMEDIATE MEDICAL CARE IF:   You have a fever.   You have difficulty breathing or shortness of breath.   You develop a continual cough, or you cough up thick or bloody sputum.  You feel sick to your stomach (nausea), throw up (vomit), or have abdominal pain.   You have worsening pain not controlled with medications.  MAKE SURE YOU:  Understand these instructions.  Will watch your condition.  Will get help right away if you are not doing well or get worse. Document Released: 02/04/2005 Document Revised:  10/07/2012 Document Reviewed: 04/08/2012 [facility] Patient Information 2015 [place], [place]. This information is not intended to replace advice given to you by your health care provider. Make sure you discuss any questions you have with your health care provider.

## 2014-08-09 NOTE — ED Notes (Signed)
He woke this am and got out bed without his cane, lost his balance and fell onto his R ribcage. Hes had pain in the R ribcage since that hurts worse when breathing. He is A&Ox4, breathing easily, denies other complaints

## 2014-08-09 NOTE — ED Provider Notes (Signed)
CSN: [id number]     Arrival date & time 08/09/14  1146 History   First MD Initiated Contact with Patient 08/09/14 1358     Chief Complaint  Patient presents with  . Rib Injury     (Consider location/radiation/quality/duration/timing/severity/associated sxs/prior Treatment) Patient is a 54 y.o. male presenting with chest pain. The history is provided by the patient.  Chest Pain Pain location:  R lateral chest Pain quality: sharp   Pain radiates to:  Does not radiate Pain radiates to the back: no   Pain severity:  Moderate Onset quality:  Sudden Timing:  Constant Progression:  Unchanged Chronicity:  New Context: trauma (R side of chest hit on bed frame after he fell)   Associated symptoms: shortness of breath   Associated symptoms: no abdominal pain, no cough, no fever and not vomiting     Past Medical History  Diagnosis Date  . Back pain, chronic     2007 fell off truck  . Hernia     Rt. Groin - repaired 1997  . HTN (hypertension)    Past Surgical History  Procedure Laterality Date  . Hernia repair      1997   Family History  Problem Relation Age of Onset  . Cancer Mother     lung cancer 86  . Hypertension Father   . Diabetes Father    History  Substance Use Topics  . Smoking status: Current Every Day Smoker -- 0.25 packs/day for 30 years    Types: Cigarettes  . Smokeless tobacco: Not on file     Comment: quit from 2002-2007  . Alcohol Use: No    Review of Systems  Constitutional: Negative for fever.  Respiratory: Positive for shortness of breath. Negative for cough.   Cardiovascular: Positive for chest pain (with deep inspiration).  Gastrointestinal: Negative for vomiting and abdominal pain.  All other systems reviewed and are negative.     Allergies  Review of patient's allergies indicates no known allergies.  Home Medications   Prior to Admission medications   Medication Sig Start Date End Date Taking? Authorizing Provider   lisinopril-hydrochlorothiazide (PRINZIDE,ZESTORETIC) 20-25 MG per tablet TAKE 1 TABLET BY MOUTH DAILY. DUE FOR FOLLOW UP WITH PRIMARY MD 08/06/14   Leona Singleton, MD  oxyCODONE (ROXICODONE) 15 MG immediate release tablet Take 15 mg by mouth every 4 (four) hours as needed for pain. Rx by [facility] pain clinic    Historical Provider, MD   BP 126/70 mmHg  Pulse 47  Temp(Src) 98.4 F (36.9 C) (Oral)  Resp 20  Ht 6' (1.829 m)  Wt 222 lb (100.699 kg)  BMI 30.10 kg/m2  SpO2 99% Physical Exam  Constitutional: He is oriented to person, place, and time. He appears well-developed and well-nourished. No distress.  HENT:  Head: Normocephalic and atraumatic.  Mouth/Throat: No oropharyngeal exudate.  Eyes: EOM are normal. Pupils are equal, round, and reactive to light.  Neck: Normal range of motion. Neck supple.  Cardiovascular: Normal rate and regular rhythm.  Exam reveals no friction rub.   No murmur heard. Pulmonary/Chest: Effort normal and breath sounds normal. No respiratory distress. He has no wheezes. He has no rales. He exhibits tenderness (R lateral, around 5th-6th ribs, R anterior below nipple line).  Abdominal: Soft. He exhibits no distension. There is no tenderness. There is no rebound.  Musculoskeletal: Normal range of motion. He exhibits no edema.  Neurological: He is alert and oriented to person, place, and time.  Skin: He is not diaphoretic.  Nursing note and vitals reviewed.   ED Course  Procedures (including critical care time) Labs Review Labs Reviewed - No data to display  Imaging Review Dg Ribs Unilateral W/chest Right  08/09/2014   CLINICAL DATA:  54 year old male who fell this morning on his right side. Right lower anterior rib pain. Initial encounter.  EXAM: RIGHT RIBS AND CHEST - 3+ VIEW  COMPARISON:  CT Abdomen and Pelvis 06/24/2008.  FINDINGS: Normal lung volumes. Normal cardiac size and mediastinal contours. Visualized tracheal air column is within normal limits.  The lungs are clear. No pneumothorax or effusion.  Bone mineralization is within normal limits. Rib marker is at the anterior right tenth rib level. No displaced right rib fracture identified. No acute osseous abnormality identified.  IMPRESSION: 1.  No displaced right rib fracture identified. 2.  No acute cardiopulmonary abnormality.   Electronically Signed   By: Odessa Fleming M.D.   On: 08/09/2014 12:47     EKG Interpretation None      MDM   Final diagnoses:  Chest wall pain  Rib fracture, right, closed, initial encounter    55 year old male here with right-sided chest pain after follows morning. Fell onto a bed frame. Went back to sleep but then woke up with pain. Is having pain with deep inspiration. Lungs are clear. He is in right lateral and anterior chest tenderness around the fifth and sixth ribs. No rib fracture on x-ray. Counseled him that x-rays are not always great at picking up rib fractures and that he likely has one. Will treat with pain medicine, but patient refuses he is under pain contract with the pain management doctor. He refused pain meds here as he is driving home. Lungs are clear, no concern for pneumothorax. Stable for discharge.    Elwin Mocha, MD 08/09/14 [phone]

## 2014-09-02 ENCOUNTER — Other Ambulatory Visit: Payer: Self-pay | Admitting: Family Medicine

## 2014-09-06 NOTE — Telephone Encounter (Signed)
Pt checking status of lisinopril refill, scheduled an appt for 8/3 but will be out before then, pt goes to [facility]

## 2014-09-06 NOTE — Telephone Encounter (Signed)
Pt informed of medication refill. Appt 09/21/14.  Clovis PuMartin, Kailin Leu L, RN

## 2014-09-06 NOTE — Telephone Encounter (Signed)
Please have patient schedule an appointment. He has not been seen in a while. Limited amount of BP medication given to get him until next office visit. Thanks

## 2014-09-21 ENCOUNTER — Ambulatory Visit (INDEPENDENT_AMBULATORY_CARE_PROVIDER_SITE_OTHER): Payer: Medicare Other | Admitting: Obstetrics and Gynecology

## 2014-09-21 ENCOUNTER — Encounter: Payer: Self-pay | Admitting: Obstetrics and Gynecology

## 2014-09-21 VITALS — BP 126/67 | HR 62 | Ht 72.0 in | Wt 220.0 lb

## 2014-09-21 DIAGNOSIS — Z716 Tobacco abuse counseling: Secondary | ICD-10-CM

## 2014-09-21 DIAGNOSIS — I10 Essential (primary) hypertension: Secondary | ICD-10-CM

## 2014-09-21 LAB — LIPID PANEL
CHOLESTEROL: 184 mg/dL (ref 125–200)
HDL: 58 mg/dL (ref >=40)
LDL Cholesterol: 106 mg/dL (ref <130)
Total CHOL/HDL Ratio: 3.2 Ratio (ref <=5.0)
Triglycerides: 102 mg/dL (ref <150)
VLDL: 20 mg/dL (ref <30)

## 2014-09-21 LAB — BASIC METABOLIC PANEL
BUN: 18 mg/dL (ref 7–25)
CALCIUM: 9.7 mg/dL (ref 8.6–10.3)
CHLORIDE: 103 mmol/L (ref 98–110)
CO2: 27 mmol/L (ref 20–31)
CREATININE: 1.14 mg/dL (ref 0.70–1.33)
GLUCOSE: 95 mg/dL (ref 65–99)
POTASSIUM: 3.9 mmol/L (ref 3.5–5.3)
Sodium: 142 mmol/L (ref 135–146)

## 2014-09-21 MED ORDER — NICOTINE 7 MG/24HR TD PT24: 7.0000 mg | MEDICATED_PATCH | Freq: Every day | TRANSDERMAL | Status: DC

## 2014-09-21 MED ORDER — LISINOPRIL-HYDROCHLOROTHIAZIDE 20-25 MG PO TABS: 1.0000 | ORAL_TABLET | Freq: Every day | ORAL | Status: DC

## 2014-09-21 NOTE — Progress Notes (Signed)
  Subjective:    Patient here for follow-up of elevated blood pressure.  He is not exercising and is adherent to a low-salt diet.  Blood pressure is well controlled at home. Cardiac symptoms: none. Patient denies: chest pain, dyspnea and palpitations. Cardiovascular risk factors: hypertension, male gender, sedentary lifestyle and smoking/ tobacco exposure. Use of agents associated with hypertension: none. History of target organ damage: none. Needs refills on BP medications.   The following portions of the patient's history were reviewed and updated as appropriate: allergies, current medications, past family history, past medical history, past social history, past surgical history and problem list.  Current every day smoker. Wants to quit smoking. Desires a nicotine patch. States that one pack of cigs last about 3 days; has been weaning down. He has quit before back in 2002.   Review of Systems Pertinent items are noted in HPI.   Objective:  BP 126/67 mmHg  Pulse 62  Ht 6' (1.829 m)  Wt 220 lb (99.791 kg)  BMI 29.83 kg/m2    Physical Exam  Constitutional: He appears well-developed and well-nourished. No distress.  HENT:  Head: Normocephalic and atraumatic.  Eyes: EOM are normal.  Cardiovascular: Normal rate, regular rhythm, normal heart sounds and intact distal pulses.   Pulmonary/Chest: Effort normal and breath sounds normal.  Skin: Skin is warm and dry.    Assessment:    Hypertension, normal blood pressure. Well-controlled on medications. Evidence of target organ damage: none.    Plan:    Medication: no change and refill given. Screening labs for initial evaluation: basic metabolic panel and lipid panel. Screening for causes of secondary hypertension: none indicated. Dietary sodium restriction. Regular aerobic exercise. Check blood pressures daily and record. Follow up: 6 months and as needed.    Caryl Ada, DO 09/21/2014, 10:32 AM PGY-2, [facility]

## 2014-09-21 NOTE — Assessment & Plan Note (Addendum)
Patient desires to quit smoking. One pack lasts about 3 days.  -Discussed need for quit date -Nicotine patches prescribed -QUITline number given

## 2014-09-21 NOTE — Assessment & Plan Note (Signed)
Refill given today. Continue current therapy no changes. See office note for additional details.

## 2014-09-21 NOTE — Patient Instructions (Signed)
Managing Your High Blood Pressure Blood pressure is a measurement of how forceful your blood is pressing against the walls of the arteries. Arteries are muscular tubes within the circulatory system. Blood pressure does not stay the same. Blood pressure rises when you are active, excited, or nervous; and it lowers during sleep and relaxation. If the numbers measuring your blood pressure stay above normal most of the time, you are at risk for health problems. High blood pressure (hypertension) is a long-term (chronic) condition in which blood pressure is elevated. A blood pressure reading is recorded as two numbers, such as 120 over 80 (or 120/80). The first, higher number is called the systolic pressure. It is a measure of the pressure in your arteries as the heart beats. The second, lower number is called the diastolic pressure. It is a measure of the pressure in your arteries as the heart relaxes between beats.  Keeping your blood pressure in a normal range is important to your overall health and prevention of health problems, such as heart disease and stroke. When your blood pressure is uncontrolled, your heart has to work harder than normal. High blood pressure is a very common condition in adults because blood pressure tends to rise with age. Men and women are equally likely to have hypertension but at different times in life. Before age 45, men are more likely to have hypertension. After 54 years of age, women are more likely to have it. Hypertension is especially common in African Americans. This condition often has no signs or symptoms. The cause of the condition is usually not known. Your caregiver can help you come up with a plan to keep your blood pressure in a normal, healthy range. BLOOD PRESSURE STAGES Blood pressure is classified into four stages: normal, prehypertension, stage 1, and stage 2. Your blood pressure reading will be used to determine what type of treatment, if any, is necessary.  Appropriate treatment options are tied to these four stages:  Normal  Systolic pressure (mm Hg): below 120.  Diastolic pressure (mm Hg): below 80. Prehypertension  Systolic pressure (mm Hg): 120 to 139.  Diastolic pressure (mm Hg): 80 to 89. Stage1  Systolic pressure (mm Hg): 140 to 159.  Diastolic pressure (mm Hg): 90 to 99. Stage2  Systolic pressure (mm Hg): 160 or above.  Diastolic pressure (mm Hg): 100 or above. RISKS RELATED TO HIGH BLOOD PRESSURE Managing your blood pressure is an important responsibility. Uncontrolled high blood pressure can lead to:  A heart attack.  A stroke.  A weakened blood vessel (aneurysm).  Heart failure.  Kidney damage.  Eye damage.  Metabolic syndrome.  Memory and concentration problems. HOW TO MANAGE YOUR BLOOD PRESSURE Blood pressure can be managed effectively with lifestyle changes and medicines (if needed). Your caregiver will help you come up with a plan to bring your blood pressure within a normal range. Your plan should include the following: Education  Read all information provided by your caregivers about how to control blood pressure.  Educate yourself on the latest guidelines and treatment recommendations. New research is always being done to further define the risks and treatments for high blood pressure. Lifestylechanges  Control your weight.  Avoid smoking.  Stay physically active.  Reduce the amount of salt in your diet.  Reduce stress.  Control any chronic conditions, such as high cholesterol or diabetes.  Reduce your alcohol intake. Medicines  Several medicines (antihypertensive medicines) are available, if needed, to bring blood pressure within a normal range.   Communication  Review all the medicines you take with your caregiver because there may be side effects or interactions.  Talk with your caregiver about your diet, exercise habits, and other lifestyle factors that may be contributing to  high blood pressure.  See your caregiver regularly. Your caregiver can help you create and adjust your plan for managing high blood pressure. RECOMMENDATIONS FOR TREATMENT AND FOLLOW-UP  The following recommendations are based on current guidelines for managing high blood pressure in nonpregnant adults. Use these recommendations to identify the proper follow-up period or treatment option based on your blood pressure reading. You can discuss these options with your caregiver.  Systolic pressure of 120 to 139 or diastolic pressure of 80 to 89: Follow up with your caregiver as directed.  Systolic pressure of 140 to 160 or diastolic pressure of 90 to 100: Follow up with your caregiver within 2 months.  Systolic pressure above 160 or diastolic pressure above 100: Follow up with your caregiver within 1 month.  Systolic pressure above 180 or diastolic pressure above 110: Consider antihypertensive therapy; follow up with your caregiver within 1 week.  Systolic pressure above 200 or diastolic pressure above 120: Begin antihypertensive therapy; follow up with your caregiver within 1 week. Document Released: 10/30/2011 Document Reviewed: 10/30/2011 [employer] Patient Information 2015 [employer]. This information is not intended to replace advice given to you by your health care provider. Make sure you discuss any questions you have with your health care provider.  

## 2014-09-22 ENCOUNTER — Encounter: Payer: Self-pay | Admitting: Obstetrics and Gynecology

## 2014-10-08 ENCOUNTER — Other Ambulatory Visit: Payer: Self-pay | Admitting: Obstetrics and Gynecology

## 2014-11-01 ENCOUNTER — Emergency Department (HOSPITAL_COMMUNITY): Payer: Medicare Other

## 2014-11-01 ENCOUNTER — Emergency Department (HOSPITAL_COMMUNITY)
Admission: EM | Admit: 2014-11-01 | Discharge: 2014-11-01 | Disposition: A | Discharge status: Home / Self Care | Payer: Medicare Other | Attending: Emergency Medicine | Admitting: Emergency Medicine

## 2014-11-01 ENCOUNTER — Encounter (HOSPITAL_COMMUNITY): Payer: Self-pay | Admitting: Emergency Medicine

## 2014-11-01 DIAGNOSIS — S299XXA Unspecified injury of thorax, initial encounter: Secondary | ICD-10-CM | POA: Diagnosis present

## 2014-11-01 DIAGNOSIS — G8929 Other chronic pain: Secondary | ICD-10-CM | POA: Insufficient documentation

## 2014-11-01 DIAGNOSIS — W1839XA Other fall on same level, initial encounter: Secondary | ICD-10-CM | POA: Insufficient documentation

## 2014-11-01 DIAGNOSIS — I10 Essential (primary) hypertension: Secondary | ICD-10-CM | POA: Insufficient documentation

## 2014-11-01 DIAGNOSIS — Y9289 Other specified places as the place of occurrence of the external cause: Secondary | ICD-10-CM | POA: Diagnosis not present

## 2014-11-01 DIAGNOSIS — Y9389 Activity, other specified: Secondary | ICD-10-CM | POA: Diagnosis not present

## 2014-11-01 DIAGNOSIS — Z79899 Other long term (current) drug therapy: Secondary | ICD-10-CM | POA: Diagnosis not present

## 2014-11-01 DIAGNOSIS — Z720 Tobacco use: Secondary | ICD-10-CM | POA: Diagnosis not present

## 2014-11-01 DIAGNOSIS — S20212A Contusion of left front wall of thorax, initial encounter: Secondary | ICD-10-CM | POA: Diagnosis not present

## 2014-11-01 DIAGNOSIS — Y998 Other external cause status: Secondary | ICD-10-CM | POA: Insufficient documentation

## 2014-11-01 DIAGNOSIS — W19XXXA Unspecified fall, initial encounter: Secondary | ICD-10-CM

## 2014-11-01 NOTE — ED Provider Notes (Signed)
CSN: [id number]     Arrival date & time 11/01/14  4098 History  This chart was scribed for non-physician practitioner, Allen Derry, PA-C working with Donnetta Hutching, MD, by Jarvis Morgan, ED Scribe. This patient was seen in room TR10C/TR10C and the patient's care was started at 10:55 AM.     Chief Complaint  Patient presents with  . Fall    Patient is a 54 y.o. male presenting with fall. The history is provided by the patient. No language interpreter was used.  Fall This is a new problem. The current episode started 1 to 2 hours ago. The problem occurs rarely. The problem has not changed since onset.Pertinent negatives include no chest pain, no abdominal pain, no headaches and no shortness of breath. Exacerbated by: movement and deep breathing. Nothing relieves the symptoms. Treatments tried: narcotics. The treatment provided no relief.    HPI Comments: Stephen Lambert is a 54 y.o. male who presents to the Emergency Department complaining of constant, 9/10, aching and sharp, left sided rib pain s/p fall onset 2 hours ago. He states he fell onto slick floor from a standing position. He states the pain is exacerbated with deep inspiration and movement. He has not had any meds PTA but takes chronic pain meds. Pt endorses he hit his head in the fall but denies any HA or pain in head. He denies any LOC. Pt is ambulatory with a cane. Pt takes chronic pain medicine on a regular basis and his last dose was 4 hours ago, pt takes his medication every 8 hours. He denies any blurred vision, fever, chills, other chest pain, SOB, abdominal pain, nausea or vomiting, diarrhea, constipation, numbness, tingling, or weakness. No bruising or abrasions.   Past Medical History  Diagnosis Date  . Back pain, chronic     2007 fell off truck  . Hernia     Rt. Groin - repaired 1997  . HTN (hypertension)    Past Surgical History  Procedure Laterality Date  . Hernia repair      1997   Family History   Problem Relation Age of Onset  . Cancer Mother     lung cancer 36  . Hypertension Father   . Diabetes Father    Social History  Substance Use Topics  . Smoking status: Current Every Day Smoker -- 0.25 packs/day for 30 years    Types: Cigarettes  . Smokeless tobacco: None     Comment: quit from 2002-2007  . Alcohol Use: No    Review of Systems  Constitutional: Negative for fever and chills.  Eyes: Negative for visual disturbance.  Respiratory: Negative for shortness of breath.   Cardiovascular: Negative for chest pain.  Gastrointestinal: Negative for nausea, vomiting, abdominal pain, diarrhea and constipation.  Genitourinary: Negative for dysuria, hematuria and difficulty urinating.  Musculoskeletal: Positive for arthralgias (left rib pain). Negative for myalgias.  Skin: Negative for color change.  Allergic/Immunologic: Negative for immunocompromised state.  Neurological: Negative for syncope, weakness, numbness and headaches.  Psychiatric/Behavioral: Negative for confusion.  10 Systems reviewed and all are negative for acute change except as noted in the HPI.      Allergies  Review of patient's allergies indicates no known allergies.  Home Medications   Prior to Admission medications   Medication Sig Start Date End Date Taking? Authorizing Provider  lisinopril-hydrochlorothiazide (PRINZIDE,ZESTORETIC) 20-25 MG per tablet Take 1 tablet by mouth daily. 09/21/14   Pincus Large, DO  lisinopril-hydrochlorothiazide (PRINZIDE,ZESTORETIC) 20-25 MG per tablet TAKE 1  TABLET BY MOUTH DAILY. DUE FOR FOLLOW UP WITH PRIMARY MD 10/10/14   Pincus Large, DO  nicotine (NICODERM CQ - DOSED IN MG/24 HR) 7 mg/24hr patch Place 1 patch (7 mg total) onto the skin daily. 09/21/14   Pincus Large, DO  oxyCODONE (ROXICODONE) 15 MG immediate release tablet Take 15 mg by mouth every 4 (four) hours as needed for pain. Rx by [facility] pain clinic    Historical Provider, MD   Triage Vitals: BP 126/76  mmHg  Pulse 57  Temp(Src) 98 F (36.7 C) (Oral)  Resp 16  SpO2 97%  Physical Exam  Constitutional: He is oriented to person, place, and time. Vital signs are normal. He appears well-developed and well-nourished.  Non-toxic appearance. No distress.  Afebrile, nontoxic, NAD  HENT:  Head: Normocephalic and atraumatic. Head is without abrasion and without contusion.  Mouth/Throat: Mucous membranes are normal.  [place]/AT, with no scalp crepitus or tenderness, no depression. No abrasions or contusions  Eyes: Conjunctivae and EOM are normal. Right eye exhibits no discharge. Left eye exhibits no discharge.  Neck: Normal range of motion. Neck supple. No spinous process tenderness and no muscular tenderness present. No rigidity. Normal range of motion present.  Cardiovascular: Normal rate.   Pulmonary/Chest: Effort normal. No respiratory distress. He exhibits tenderness. He exhibits no crepitus, no deformity and no retraction.    Mild left lateral chest wall TTP with no crepitus, retraction, or deformity. No bruising  Abdominal: Normal appearance. He exhibits no distension.  Musculoskeletal: Normal range of motion.  Neurological: He is alert and oriented to person, place, and time. He has normal strength. No sensory deficit.  Skin: Skin is warm, dry and intact. No rash noted.  Psychiatric: He has a normal mood and affect.  Nursing note and vitals reviewed.   ED Course  Procedures (including critical care time)  DIAGNOSTIC STUDIES: Oxygen Saturation is 97% on RA, normal by my interpretation.    COORDINATION OF CARE:    Labs Review Labs Reviewed - No data to display  Imaging Review Dg Ribs Unilateral W/chest Left  11/01/2014   CLINICAL DATA:  Anterior lower LEFT rib pain for 3 hours after falling, walks with a cane, cane slipped on a slippery floor resulting in fall, hypertension  EXAM: LEFT RIBS AND CHEST - 3+ VIEW  COMPARISON:  08/09/2014  FINDINGS: Normal heart size, mediastinal  contours, and pulmonary vascularity.  Lungs clear.  No pleural effusion or pneumothorax.  BB placed at site of symptoms lower LEFT chest.  Osseous mineralization normal.  No rib fracture or bone destruction identified.  IMPRESSION: No radiographic evidence acute injury.   Electronically Signed   By: Ulyses Southward M.D.   On: 11/01/2014 11:04   I have personally reviewed and evaluated these images as part of my medical decision-making.   EKG Interpretation None      MDM   Final diagnoses:  Rib contusion, left, initial encounter  Fall, initial encounter    54 y.o. male here with mechanical fall and L rib contusion. +head impact but no focal neuro deficits and no HA or LOC. No scalp tenderness, doubt need for imaging. L ribs with tenderness. Will obtain xray. Pt takes chronic pain meds, declines meds here. Will reassess shortly.   11:31 AM Xray neg. Discussed keeping lungs open with turning/coughing q2hrs, and splinting when performing this. Discussed home pain meds, and ice for pain. F/up with PCP in 1 wk. I explained the diagnosis and have given explicit precautions  to return to the ER including for any other new or worsening symptoms. The patient understands and accepts the medical plan as it's been dictated and I have answered their questions. Discharge instructions concerning home care and prescriptions have been given. The patient is STABLE and is discharged to home in good condition.   I personally performed the services described in this documentation, which was scribed in my presence. The recorded information has been reviewed and is accurate.  BP 126/76 mmHg  Pulse 57  Temp(Src) 98 F (36.7 C) (Oral)  Resp 16  SpO2 97%  No orders of the defined types were placed in this encounter.       France Ravens Camprubi-Soms, PA-C 11/01/14 1133  Donnetta Hutching, MD 11/01/14 1239

## 2014-11-01 NOTE — ED Notes (Signed)
Pt fell onto slick floor from standing position at [employer] at 0830 this morning. Left rib pain. Tender to touch and deep breath. Did not pass out or hit head.

## 2014-11-01 NOTE — Discharge Instructions (Signed)
Use your home pain medications as directed as needed for pain. Use ice to the affected area to help with additional pain relief. Follow up with your regular doctor in 1 week for recheck of symptoms. Return to the ER for changes or worsening symptoms.   Chest Contusion A chest contusion is a deep bruise on your chest area. Contusions are the result of an injury that caused bleeding under the skin. A chest contusion may involve bruising of the skin, muscles, or ribs. The contusion may turn blue, purple, or yellow. Minor injuries will give you a painless contusion, but more severe contusions may stay painful and swollen for a few weeks. CAUSES  A contusion is usually caused by a blow, trauma, or direct force to an area of the body. SYMPTOMS   Swelling and redness of the injured area.  Discoloration of the injured area.  Tenderness and soreness of the injured area.  Pain. DIAGNOSIS  The diagnosis can be made by taking a history and performing a physical exam. An X-ray, CT scan, or MRI may be needed to determine if there were any associated injuries, such as broken bones (fractures) or internal injuries. TREATMENT  Often, the best treatment for a chest contusion is resting, icing, and applying cold compresses to the injured area. Deep breathing exercises may be recommended to reduce the risk of pneumonia. Over-the-counter medicines may also be recommended for pain control. HOME CARE INSTRUCTIONS   Put ice on the injured area.  Put ice in a plastic bag.  Place a towel between your skin and the bag.  Leave the ice on for 15-20 minutes, 03-04 times a day.  Only take over-the-counter or prescription medicines as directed by your caregiver. Your caregiver may recommend avoiding anti-inflammatory medicines (aspirin, ibuprofen, and naproxen) for 48 hours because these medicines may increase bruising.  Rest the injured area.  Perform deep-breathing exercises as directed by your  caregiver.  Stop smoking if you smoke.  Do not lift objects over 5 pounds (2.3 kg) for 3 days or longer if recommended by your caregiver. SEEK IMMEDIATE MEDICAL CARE IF:   You have increased bruising or swelling.  You have pain that is getting worse.  You have difficulty breathing.  You have dizziness, weakness, or fainting.  You have blood in your urine or stool.  You cough up or vomit blood.  Your swelling or pain is not relieved with medicines. MAKE SURE YOU:   Understand these instructions.  Will watch your condition.  Will get help right away if you are not doing well or get worse. Document Released: 10/30/2000 Document Revised: 10/30/2011 Document Reviewed: 07/29/2011 [facility] Patient Information 2015 [place], [place]. This information is not intended to replace advice given to you by your health care provider. Make sure you discuss any questions you have with your health care provider.  Cryotherapy Cryotherapy means treatment with cold. Ice or gel packs can be used to reduce both pain and swelling. Ice is the most helpful within the first 24 to 48 hours after an injury or flare-up from overusing a muscle or joint. Sprains, strains, spasms, burning pain, shooting pain, and aches can all be eased with ice. Ice can also be used when recovering from surgery. Ice is effective, has very few side effects, and is safe for most people to use. PRECAUTIONS  Ice is not a safe treatment option for people with:  Raynaud phenomenon. This is a condition affecting small blood vessels in the extremities. Exposure to cold may  cause your problems to return.  Cold hypersensitivity. There are many forms of cold hypersensitivity, including:  Cold urticaria. Red, itchy hives appear on the skin when the tissues begin to warm after being iced.  Cold erythema. This is a red, itchy rash caused by exposure to cold.  Cold hemoglobinuria. Red blood cells break down when the tissues begin to warm  after being iced. The hemoglobin that carry oxygen are passed into the urine because they cannot combine with blood proteins fast enough.  Numbness or altered sensitivity in the area being iced. If you have any of the following conditions, do not use ice until you have discussed cryotherapy with your caregiver:  Heart conditions, such as arrhythmia, angina, or chronic heart disease.  High blood pressure.  Healing wounds or open skin in the area being iced.  Current infections.  Rheumatoid arthritis.  Poor circulation.  Diabetes. Ice slows the blood flow in the region it is applied. This is beneficial when trying to stop inflamed tissues from spreading irritating chemicals to surrounding tissues. However, if you expose your skin to cold temperatures for too long or without the proper protection, you can damage your skin or nerves. Watch for signs of skin damage due to cold. HOME CARE INSTRUCTIONS Follow these tips to use ice and cold packs safely.  Place a dry or damp towel between the ice and skin. A damp towel will cool the skin more quickly, so you may need to shorten the time that the ice is used.  For a more rapid response, add gentle compression to the ice.  Ice for no more than 10 to 20 minutes at a time. The bonier the area you are icing, the less time it will take to get the benefits of ice.  Check your skin after 5 minutes to make sure there are no signs of a poor response to cold or skin damage.  Rest 20 minutes or more between uses.  Once your skin is numb, you can end your treatment. You can test numbness by very lightly touching your skin. The touch should be so light that you do not see the skin dimple from the pressure of your fingertip. When using ice, most people will feel these normal sensations in this order: cold, burning, aching, and numbness.  Do not use ice on someone who cannot communicate their responses to pain, such as small children or people with  dementia. HOW TO MAKE AN ICE PACK Ice packs are the most common way to use ice therapy. Other methods include ice massage, ice baths, and cryosprays. Muscle creams that cause a cold, tingly feeling do not offer the same benefits that ice offers and should not be used as a substitute unless recommended by your caregiver. To make an ice pack, do one of the following:  Place crushed ice or a bag of frozen vegetables in a sealable plastic bag. Squeeze out the excess air. Place this bag inside another plastic bag. Slide the bag into a pillowcase or place a damp towel between your skin and the bag.  Mix 3 parts water with 1 part rubbing alcohol. Freeze the mixture in a sealable plastic bag. When you remove the mixture from the freezer, it will be slushy. Squeeze out the excess air. Place this bag inside another plastic bag. Slide the bag into a pillowcase or place a damp towel between your skin and the bag. SEEK MEDICAL CARE IF:  You develop white spots on your skin. This may  give the skin a blotchy (mottled) appearance.  Your skin turns blue or pale.  Your skin becomes waxy or hard.  Your swelling gets worse. MAKE SURE YOU:   Understand these instructions.  Will watch your condition.  Will get help right away if you are not doing well or get worse. Document Released: 10/01/2010 Document Revised: 06/21/2013 Document Reviewed: 10/01/2010 [facility] Patient Information 2015 [place], [place]. This information is not intended to replace advice given to you by your health care provider. Make sure you discuss any questions you have with your health care provider.

## 2015-03-10 ENCOUNTER — Ambulatory Visit (INDEPENDENT_AMBULATORY_CARE_PROVIDER_SITE_OTHER): Payer: Medicare Other | Admitting: Family Medicine

## 2015-03-10 ENCOUNTER — Encounter: Payer: Self-pay | Admitting: Family Medicine

## 2015-03-10 ENCOUNTER — Ambulatory Visit: Payer: Medicare Other | Admitting: Obstetrics and Gynecology

## 2015-03-10 VITALS — BP 128/59 | HR 55 | Temp 98.5°F | Resp 18 | Ht 72.0 in | Wt 238.2 lb

## 2015-03-10 DIAGNOSIS — I10 Essential (primary) hypertension: Secondary | ICD-10-CM | POA: Diagnosis present

## 2015-03-10 MED ORDER — LISINOPRIL-HYDROCHLOROTHIAZIDE 20-25 MG PO TABS: 1.0000 | ORAL_TABLET | Freq: Every day | ORAL | Status: DC

## 2015-03-10 NOTE — Progress Notes (Signed)
Patient ID: DARCY CORDNER, male   DOB: 11/16/1960, 55 y.o.   MRN: [id number]    Subjective: CC: f/u BPs HPI: Patient is a 55 y.o. male with a past medical history of chronic pain and HTN presenting to clinic today for a f/u HTN.  Hypertension Blood pressure at home: hasn't taken in a while Blood pressure today: 129/53  Meds: Compliant with lisinopril-HCTZ Side effects: None ROS: Denies headache, dizziness, visual changes, nausea, vomiting, chest pain, abdominal pain or shortness of breath.  Tobacco use: Patient didn't want to buy  Social History:  Cutting back, down to 5 cigs/day.   Health Maintenance: Patient would like to wait until next appt in 6 months to get hep C, HIV, TDaP done so he can mentally prepare. Would also like to continue to think about colonoscopy. Has had flu vaccine at [facility] in Oct, will bring those records.  ROS: All other systems reviewed and are negative.  Past Medical History Patient Active Problem List   Diagnosis Date Noted  . HTN (hypertension) 05/20/2012  . Tobacco abuse counseling 05/20/2012  . Chronic back pain 05/20/2012  . At high risk for falls 05/20/2012    Medications- reviewed and updated Current Outpatient Prescriptions  Medication Sig Dispense Refill  . lisinopril-hydrochlorothiazide (PRINZIDE,ZESTORETIC) 20-25 MG tablet Take 1 tablet by mouth daily. 90 tablet 3  . oxyCODONE (ROXICODONE) 15 MG immediate release tablet Take 15 mg by mouth every 4 (four) hours as needed for pain. Rx by [facility] pain clinic    . nicotine (NICODERM CQ - DOSED IN MG/24 HR) 7 mg/24hr patch Place 1 patch (7 mg total) onto the skin daily. (Patient not taking: Reported on 03/10/2015) 28 patch 0   No current facility-administered medications for this visit.    Objective: Office vital signs reviewed. BP 128/59 mmHg  Pulse 55  Temp(Src) 98.5 F (36.9 C) (Oral)  Resp 18  Ht 6' (1.829 m)  Wt 238 lb 3.2 oz (108.047 kg)  BMI 32.30 kg/m2  SpO2 98%   Physical  Examination:  General: Awake, alert, well- nourished, NAD Eyes: Conjunctiva non-injected. PERRL.  Cardio: RRR, no m/r/g noted.  No LE edema. PMI is not displaced.  Pulm: No increased WOB.  CTAB, without wheezes, rhonchi or crackles noted.  MSK: Ambulates with a cane.  Skin: dry, intact, no rashes or lesions  Assessment/Plan: HTN (hypertension) BPs currently at goal. No side effects with medications.  Medication: no change and refill given. Basic metabolic panel at next visit given he's been on this medication for 3.5 years.  Dietary sodium restriction discussed.  Regular aerobic exercise stressed.  Follow up: 6 months and as needed.       No orders of the defined types were placed in this encounter.    Meds ordered this encounter  Medications  . lisinopril-hydrochlorothiazide (PRINZIDE,ZESTORETIC) 20-25 MG tablet    Sig: Take 1 tablet by mouth daily.    Dispense:  90 tablet    Refill:  3    Joanna Puff PGY-2, [facility]

## 2015-03-10 NOTE — Patient Instructions (Signed)
It was nice to meet you. I will write in my note to Dr Doroteo Glassman that next time you'll consider blood work, vaccines, and a colonoscopy. Your blood pressure looks great. Continue your current medication- I have refilled it. Try to get some form of aerobic exercise at least 4 times a week. Let us know if you need help quitting smoking.

## 2015-03-10 NOTE — Assessment & Plan Note (Addendum)
BPs currently at goal. No side effects with medications.  Medication: no change and refill given. Basic metabolic panel at next visit given he's been on this medication for 3.5 years.  Dietary sodium restriction discussed.  Regular aerobic exercise stressed.  Follow up: 6 months and as needed.

## 2015-03-10 NOTE — Progress Notes (Signed)
Patient here for office visit.  Patient complains of chronic pain in mid-lower back scaled currently at a 6. Described as a constant aching and throbbing. Intermittent sharp pains when doing house work.  Patient had flu shot [facility] October 2016

## 2015-11-29 ENCOUNTER — Emergency Department (HOSPITAL_COMMUNITY): Payer: Medicare Other

## 2015-11-29 ENCOUNTER — Emergency Department (HOSPITAL_COMMUNITY): Payer: Medicare Other | Admitting: Certified Registered"

## 2015-11-29 ENCOUNTER — Observation Stay (HOSPITAL_COMMUNITY)
Admission: EM | Admit: 2015-11-29 | Discharge: 2015-11-30 | Disposition: A | Discharge status: Home / Self Care | Payer: Medicare Other | Attending: General Surgery | Admitting: General Surgery

## 2015-11-29 ENCOUNTER — Encounter (HOSPITAL_COMMUNITY): Payer: Self-pay | Admitting: *Deleted

## 2015-11-29 ENCOUNTER — Encounter (HOSPITAL_COMMUNITY)
Admission: EM | Disposition: A | Discharge status: Home / Self Care | Payer: Self-pay | Source: Home / Self Care | Attending: Emergency Medicine

## 2015-11-29 DIAGNOSIS — G8929 Other chronic pain: Secondary | ICD-10-CM | POA: Insufficient documentation

## 2015-11-29 DIAGNOSIS — Z801 Family history of malignant neoplasm of trachea, bronchus and lung: Secondary | ICD-10-CM | POA: Diagnosis not present

## 2015-11-29 DIAGNOSIS — K353 Acute appendicitis with localized peritonitis, without perforation or gangrene: Secondary | ICD-10-CM

## 2015-11-29 DIAGNOSIS — E669 Obesity, unspecified: Secondary | ICD-10-CM | POA: Diagnosis not present

## 2015-11-29 DIAGNOSIS — M549 Dorsalgia, unspecified: Secondary | ICD-10-CM | POA: Insufficient documentation

## 2015-11-29 DIAGNOSIS — Z6830 Body mass index (BMI) 30.0-30.9, adult: Secondary | ICD-10-CM | POA: Insufficient documentation

## 2015-11-29 DIAGNOSIS — F17210 Nicotine dependence, cigarettes, uncomplicated: Secondary | ICD-10-CM | POA: Insufficient documentation

## 2015-11-29 DIAGNOSIS — I10 Essential (primary) hypertension: Secondary | ICD-10-CM | POA: Insufficient documentation

## 2015-11-29 DIAGNOSIS — Z8249 Family history of ischemic heart disease and other diseases of the circulatory system: Secondary | ICD-10-CM | POA: Insufficient documentation

## 2015-11-29 DIAGNOSIS — K352 Acute appendicitis with generalized peritonitis: Secondary | ICD-10-CM | POA: Diagnosis not present

## 2015-11-29 DIAGNOSIS — Z833 Family history of diabetes mellitus: Secondary | ICD-10-CM | POA: Diagnosis not present

## 2015-11-29 DIAGNOSIS — K3580 Unspecified acute appendicitis: Secondary | ICD-10-CM

## 2015-11-29 HISTORY — PX: LAPAROSCOPIC APPENDECTOMY: SHX408

## 2015-11-29 LAB — CBC
HCT: 39.7 % (ref 39.0–52.0)
HEMOGLOBIN: 13.2 g/dL (ref 13.0–17.0)
MCH: 26.7 pg (ref 26.0–34.0)
MCHC: 33.2 g/dL (ref 30.0–36.0)
MCV: 80.2 fL (ref 78.0–100.0)
Platelets: 251 10*3/uL (ref 150–400)
RBC: 4.95 MIL/uL (ref 4.22–5.81)
RDW: 13.8 % (ref 11.5–15.5)
WBC: 16.2 10*3/uL — AB (ref 4.0–10.5)

## 2015-11-29 LAB — BASIC METABOLIC PANEL
ANION GAP: 10 (ref 5–15)
BUN: 16 mg/dL (ref 6–20)
CALCIUM: 9.8 mg/dL (ref 8.9–10.3)
CHLORIDE: 100 mmol/L — AB (ref 101–111)
CO2: 30 mmol/L (ref 22–32)
Creatinine, Ser: 1.22 mg/dL (ref 0.61–1.24)
GFR calc non Af Amer: >60 mL/min (ref >60)
Glucose, Bld: 133 mg/dL — ABNORMAL HIGH (ref 65–99)
Potassium: 3.7 mmol/L (ref 3.5–5.1)
SODIUM: 140 mmol/L (ref 135–145)

## 2015-11-29 LAB — LIPASE, BLOOD: LIPASE: 15 U/L (ref 11–51)

## 2015-11-29 LAB — URINALYSIS, ROUTINE W REFLEX MICROSCOPIC
BILIRUBIN URINE: NEGATIVE
Glucose, UA: NEGATIVE mg/dL
Hgb urine dipstick: NEGATIVE
KETONES UR: NEGATIVE mg/dL
Leukocytes, UA: NEGATIVE
NITRITE: NEGATIVE
PROTEIN: NEGATIVE mg/dL
SPECIFIC GRAVITY, URINE: 1.024 (ref 1.005–1.030)
pH: 6 (ref 5.0–8.0)

## 2015-11-29 LAB — I-STAT TROPONIN, ED: TROPONIN I, POC: 0 ng/mL (ref 0.00–0.08)

## 2015-11-29 SURGERY — APPENDECTOMY, LAPAROSCOPIC
Anesthesia: General | Site: Abdomen

## 2015-11-29 MED ORDER — IOPAMIDOL (ISOVUE-300) INJECTION 61%
INTRAVENOUS | Status: AC
  Filled 2015-11-29: qty 100

## 2015-11-29 MED ORDER — SODIUM CHLORIDE 0.9 % IR SOLN
Status: DC | PRN
  Administered 2015-11-29: 1000 mL

## 2015-11-29 MED ORDER — FENTANYL CITRATE (PF) 100 MCG/2ML IJ SOLN
INTRAMUSCULAR | Status: AC
  Filled 2015-11-29: qty 2

## 2015-11-29 MED ORDER — OXYCODONE HCL 5 MG PO TABS
5.0000 mg | ORAL_TABLET | ORAL | Status: DC | PRN
  Administered 2015-11-29: 5 mg via ORAL
  Administered 2015-11-29 – 2015-11-30 (×3): 10 mg via ORAL
  Filled 2015-11-29: qty 2
  Filled 2015-11-29: qty 1
  Filled 2015-11-29 (×2): qty 2

## 2015-11-29 MED ORDER — ONDANSETRON HCL 4 MG/2ML IJ SOLN
INTRAMUSCULAR | Status: DC | PRN
  Administered 2015-11-29: 4 mg via INTRAVENOUS

## 2015-11-29 MED ORDER — SUGAMMADEX SODIUM 200 MG/2ML IV SOLN
INTRAVENOUS | Status: DC | PRN
  Administered 2015-11-29: 210 mg via INTRAVENOUS

## 2015-11-29 MED ORDER — MIDAZOLAM HCL 2 MG/2ML IJ SOLN
INTRAMUSCULAR | Status: AC
  Filled 2015-11-29: qty 2

## 2015-11-29 MED ORDER — LIDOCAINE HCL (CARDIAC) 20 MG/ML IV SOLN
INTRAVENOUS | Status: DC | PRN
  Administered 2015-11-29: 100 mg via INTRAVENOUS

## 2015-11-29 MED ORDER — DIPHENHYDRAMINE HCL 12.5 MG/5ML PO ELIX: 12.5000 mg | ORAL_SOLUTION | Freq: Four times a day (QID) | ORAL | Status: DC | PRN

## 2015-11-29 MED ORDER — ONDANSETRON HCL 4 MG/2ML IJ SOLN
4.0000 mg | Freq: Once | INTRAMUSCULAR | Status: AC
  Administered 2015-11-29: 4 mg via INTRAVENOUS
  Filled 2015-11-29: qty 2

## 2015-11-29 MED ORDER — EPHEDRINE SULFATE 50 MG/ML IJ SOLN
INTRAMUSCULAR | Status: DC | PRN
  Administered 2015-11-29 (×2): 10 mg via INTRAVENOUS

## 2015-11-29 MED ORDER — FENTANYL CITRATE (PF) 100 MCG/2ML IJ SOLN
100.0000 ug | Freq: Once | INTRAMUSCULAR | Status: AC
  Administered 2015-11-29: 100 ug via INTRAVENOUS
  Filled 2015-11-29: qty 2

## 2015-11-29 MED ORDER — HYDROMORPHONE HCL 1 MG/ML IJ SOLN
1.0000 mg | INTRAMUSCULAR | Status: DC | PRN
  Administered 2015-11-29 – 2015-11-30 (×2): 1 mg via INTRAVENOUS
  Filled 2015-11-29 (×2): qty 1

## 2015-11-29 MED ORDER — ONDANSETRON 4 MG PO TBDP
ORAL_TABLET | ORAL | Status: AC
  Filled 2015-11-29: qty 1

## 2015-11-29 MED ORDER — FENTANYL CITRATE (PF) 100 MCG/2ML IJ SOLN
25.0000 ug | INTRAMUSCULAR | Status: DC | PRN
  Administered 2015-11-29 (×2): 50 ug via INTRAVENOUS

## 2015-11-29 MED ORDER — FENTANYL CITRATE (PF) 100 MCG/2ML IJ SOLN
INTRAMUSCULAR | Status: AC
  Filled 2015-11-29: qty 4

## 2015-11-29 MED ORDER — LIDOCAINE 2% (20 MG/ML) 5 ML SYRINGE
INTRAMUSCULAR | Status: AC
  Filled 2015-11-29: qty 5

## 2015-11-29 MED ORDER — IOPAMIDOL (ISOVUE-300) INJECTION 61%
100.0000 mL | Freq: Once | INTRAVENOUS | Status: AC | PRN
  Administered 2015-11-29: 100 mL via INTRAVENOUS

## 2015-11-29 MED ORDER — BUPIVACAINE-EPINEPHRINE 0.25% -1:200000 IJ SOLN
INTRAMUSCULAR | Status: AC
  Filled 2015-11-29: qty 1

## 2015-11-29 MED ORDER — HYDROMORPHONE HCL 1 MG/ML IJ SOLN
1.0000 mg | Freq: Once | INTRAMUSCULAR | Status: AC
  Administered 2015-11-29: 1 mg via INTRAVENOUS
  Filled 2015-11-29: qty 1

## 2015-11-29 MED ORDER — SODIUM CHLORIDE 0.9 % IV SOLN
Freq: Once | INTRAVENOUS | Status: AC
Start: 2015-11-29 — End: 2015-11-29
  Administered 2015-11-29: 23:00:00 via INTRAVENOUS

## 2015-11-29 MED ORDER — PROPOFOL 10 MG/ML IV BOLUS
INTRAVENOUS | Status: AC
  Filled 2015-11-29: qty 20

## 2015-11-29 MED ORDER — PROPOFOL 10 MG/ML IV BOLUS
INTRAVENOUS | Status: DC | PRN
  Administered 2015-11-29: 170 mg via INTRAVENOUS

## 2015-11-29 MED ORDER — SUCCINYLCHOLINE CHLORIDE 20 MG/ML IJ SOLN
INTRAMUSCULAR | Status: DC | PRN
  Administered 2015-11-29: 140 mg via INTRAVENOUS

## 2015-11-29 MED ORDER — PANTOPRAZOLE SODIUM 40 MG PO TBEC
40.0000 mg | DELAYED_RELEASE_TABLET | Freq: Every day | ORAL | Status: DC
  Administered 2015-11-29 – 2015-11-30 (×2): 40 mg via ORAL
  Filled 2015-11-29 (×2): qty 1

## 2015-11-29 MED ORDER — PROMETHAZINE HCL 25 MG/ML IJ SOLN: 6.2500 mg | INTRAMUSCULAR | Status: DC | PRN

## 2015-11-29 MED ORDER — PIPERACILLIN-TAZOBACTAM 3.375 G IVPB
3.3750 g | Freq: Three times a day (TID) | INTRAVENOUS | Status: AC
  Administered 2015-11-29 – 2015-11-30 (×3): 3.375 g via INTRAVENOUS
  Filled 2015-11-29 (×3): qty 50

## 2015-11-29 MED ORDER — ACETAMINOPHEN 500 MG PO TABS
ORAL_TABLET | ORAL | Status: AC
Start: 2015-11-29 — End: 2015-11-29
  Filled 2015-11-29: qty 2

## 2015-11-29 MED ORDER — ONDANSETRON HCL 4 MG/2ML IJ SOLN: 4.0000 mg | Freq: Four times a day (QID) | INTRAMUSCULAR | Status: DC | PRN

## 2015-11-29 MED ORDER — ROCURONIUM BROMIDE 10 MG/ML (PF) SYRINGE
PREFILLED_SYRINGE | INTRAVENOUS | Status: AC
  Filled 2015-11-29: qty 10

## 2015-11-29 MED ORDER — 0.9 % SODIUM CHLORIDE (POUR BTL) OPTIME
TOPICAL | Status: DC | PRN
  Administered 2015-11-29: 1000 mL

## 2015-11-29 MED ORDER — SUGAMMADEX SODIUM 200 MG/2ML IV SOLN
INTRAVENOUS | Status: AC
  Filled 2015-11-29: qty 2

## 2015-11-29 MED ORDER — ACETAMINOPHEN 500 MG PO TABS
1000.0000 mg | ORAL_TABLET | Freq: Four times a day (QID) | ORAL | Status: DC
  Administered 2015-11-29 – 2015-11-30 (×4): 1000 mg via ORAL
  Filled 2015-11-29 (×5): qty 2

## 2015-11-29 MED ORDER — ACETAMINOPHEN 325 MG PO TABS
ORAL_TABLET | ORAL | Status: DC | PRN
  Administered 2015-11-29: 1000 mg via ORAL

## 2015-11-29 MED ORDER — ONDANSETRON 4 MG PO TBDP: 4.0000 mg | ORAL_TABLET | Freq: Four times a day (QID) | ORAL | Status: DC | PRN

## 2015-11-29 MED ORDER — OXYCODONE HCL 5 MG PO TABS
ORAL_TABLET | ORAL | Status: AC
  Filled 2015-11-29: qty 1

## 2015-11-29 MED ORDER — HYDROMORPHONE HCL 1 MG/ML IJ SOLN
1.0000 mg | INTRAMUSCULAR | Status: DC | PRN
  Administered 2015-11-29: 1 mg via INTRAVENOUS
  Filled 2015-11-29: qty 1

## 2015-11-29 MED ORDER — SODIUM CHLORIDE 0.9 % IV BOLUS (SEPSIS)
1000.0000 mL | Freq: Once | INTRAVENOUS | Status: AC
Start: 2015-11-29 — End: 2015-11-29
  Administered 2015-11-29: 1000 mL via INTRAVENOUS

## 2015-11-29 MED ORDER — DIPHENHYDRAMINE HCL 50 MG/ML IJ SOLN: 12.5000 mg | Freq: Four times a day (QID) | INTRAMUSCULAR | Status: DC | PRN

## 2015-11-29 MED ORDER — BUPIVACAINE-EPINEPHRINE 0.25% -1:200000 IJ SOLN
INTRAMUSCULAR | Status: DC | PRN
  Administered 2015-11-29: 19 mL

## 2015-11-29 MED ORDER — ENOXAPARIN SODIUM 40 MG/0.4ML ~~LOC~~ SOLN
40.0000 mg | SUBCUTANEOUS | Status: DC
  Administered 2015-11-30: 40 mg via SUBCUTANEOUS
  Filled 2015-11-29: qty 0.4

## 2015-11-29 MED ORDER — ONDANSETRON 4 MG PO TBDP
4.0000 mg | ORAL_TABLET | Freq: Once | ORAL | Status: AC | PRN
  Administered 2015-11-29: 4 mg via ORAL

## 2015-11-29 MED ORDER — LACTATED RINGERS IV SOLN
INTRAVENOUS | Status: DC
  Administered 2015-11-29: 12:00:00 via INTRAVENOUS
  Administered 2015-11-29: 1000 mL via INTRAVENOUS

## 2015-11-29 MED ORDER — ONDANSETRON HCL 4 MG/2ML IJ SOLN
INTRAMUSCULAR | Status: AC
  Filled 2015-11-29: qty 2

## 2015-11-29 MED ORDER — FENTANYL CITRATE (PF) 100 MCG/2ML IJ SOLN
INTRAMUSCULAR | Status: DC | PRN
  Administered 2015-11-29: 100 ug via INTRAVENOUS
  Administered 2015-11-29 (×2): 50 ug via INTRAVENOUS

## 2015-11-29 MED ORDER — PROMETHAZINE HCL 25 MG/ML IJ SOLN
12.5000 mg | Freq: Four times a day (QID) | INTRAMUSCULAR | Status: DC | PRN
Start: 2015-11-29 — End: 2015-11-30

## 2015-11-29 MED ORDER — MIDAZOLAM HCL 5 MG/5ML IJ SOLN
INTRAMUSCULAR | Status: DC | PRN
  Administered 2015-11-29: 2 mg via INTRAVENOUS

## 2015-11-29 MED ORDER — PIPERACILLIN-TAZOBACTAM 3.375 G IVPB
3.3750 g | Freq: Three times a day (TID) | INTRAVENOUS | Status: DC
  Administered 2015-11-29: 3.375 g via INTRAVENOUS
  Filled 2015-11-29: qty 50

## 2015-11-29 MED ORDER — ROCURONIUM BROMIDE 100 MG/10ML IV SOLN
INTRAVENOUS | Status: DC | PRN
  Administered 2015-11-29: 50 mg via INTRAVENOUS

## 2015-11-29 SURGICAL SUPPLY — 60 items
ADH SKN CLS APL DERMABOND .7 (GAUZE/BANDAGES/DRESSINGS)
APL SKNCLS STERI-STRIP NONHPOA (GAUZE/BANDAGES/DRESSINGS)
APPLIER CLIP ROT 10 11.4 M/L (STAPLE)
APR CLP MED LRG 11.4X10 (STAPLE)
BAG SPEC RTRVL 10 TROC 200 (ENDOMECHANICALS) ×1
BANDAGE ADH SHEER 1  50/CT (GAUZE/BANDAGES/DRESSINGS) IMPLANT
BENZOIN TINCTURE PRP APPL 2/3 (GAUZE/BANDAGES/DRESSINGS) IMPLANT
BLADE SURG ROTATE 9660 (MISCELLANEOUS) IMPLANT
CANISTER SUCTION 2500CC (MISCELLANEOUS) ×3 IMPLANT
CHLORAPREP W/TINT 26ML (MISCELLANEOUS) ×3 IMPLANT
CLIP APPLIE ROT 10 11.4 M/L (STAPLE) IMPLANT
CLOSURE WOUND 1/2 X4 (GAUZE/BANDAGES/DRESSINGS)
COVER SURGICAL LIGHT HANDLE (MISCELLANEOUS) ×3 IMPLANT
CUTTER FLEX LINEAR 45M (STAPLE) ×3 IMPLANT
DERMABOND ADVANCED (GAUZE/BANDAGES/DRESSINGS)
DERMABOND ADVANCED .7 DNX12 (GAUZE/BANDAGES/DRESSINGS) IMPLANT
DRSG TEGADERM 4X4.75 (GAUZE/BANDAGES/DRESSINGS) IMPLANT
ELECT REM PT RETURN 9FT ADLT (ELECTROSURGICAL) ×3
ELECTRODE REM PT RTRN 9FT ADLT (ELECTROSURGICAL) ×1 IMPLANT
ENDOLOOP SUT PDS II  0 18 (SUTURE)
ENDOLOOP SUT PDS II 0 18 (SUTURE) IMPLANT
GAUZE SPONGE 2X2 8PLY STRL LF (GAUZE/BANDAGES/DRESSINGS) IMPLANT
GLOVE BIO SURGEON STRL SZ8 (GLOVE) ×2 IMPLANT
GLOVE BIOGEL M STRL SZ7.5 (GLOVE) ×3 IMPLANT
GLOVE BIOGEL PI IND STRL 7.0 (GLOVE) IMPLANT
GLOVE BIOGEL PI IND STRL 8 (GLOVE) ×1 IMPLANT
GLOVE BIOGEL PI IND STRL 8.5 (GLOVE) IMPLANT
GLOVE BIOGEL PI INDICATOR 7.0 (GLOVE) ×2
GLOVE BIOGEL PI INDICATOR 8 (GLOVE) ×2
GLOVE BIOGEL PI INDICATOR 8.5 (GLOVE) ×2
GLOVE SURG SS PI 7.0 STRL IVOR (GLOVE) ×2 IMPLANT
GOWN STRL REUS W/ TWL LRG LVL3 (GOWN DISPOSABLE) ×2 IMPLANT
GOWN STRL REUS W/TWL 2XL LVL3 (GOWN DISPOSABLE) ×3 IMPLANT
GOWN STRL REUS W/TWL LRG LVL3 (GOWN DISPOSABLE) ×6
GRASPER SUT TROCAR 14GX15 (MISCELLANEOUS) ×2 IMPLANT
KIT BASIN OR (CUSTOM PROCEDURE TRAY) ×3 IMPLANT
KIT ROOM TURNOVER OR (KITS) ×3 IMPLANT
NS IRRIG 1000ML POUR BTL (IV SOLUTION) ×3 IMPLANT
PAD ARMBOARD 7.5X6 YLW CONV (MISCELLANEOUS) ×6 IMPLANT
POUCH RETRIEVAL ECOSAC 10 (ENDOMECHANICALS) ×1 IMPLANT
POUCH RETRIEVAL ECOSAC 10MM (ENDOMECHANICALS) ×2
RELOAD 45 VASCULAR/THIN (ENDOMECHANICALS) IMPLANT
RELOAD STAPLE 45 2.5 WHT GRN (ENDOMECHANICALS) IMPLANT
RELOAD STAPLE 45 3.5 BLU ETS (ENDOMECHANICALS) IMPLANT
RELOAD STAPLE TA45 3.5 REG BLU (ENDOMECHANICALS) IMPLANT
SCALPEL HARMONIC ACE (MISCELLANEOUS) ×3 IMPLANT
SCISSORS LAP 5X35 DISP (ENDOMECHANICALS) IMPLANT
SET IRRIG TUBING LAPAROSCOPIC (IRRIGATION / IRRIGATOR) ×3 IMPLANT
SPECIMEN JAR SMALL (MISCELLANEOUS) ×3 IMPLANT
SPONGE GAUZE 2X2 STER 10/PKG (GAUZE/BANDAGES/DRESSINGS)
STRIP CLOSURE SKIN 1/2X4 (GAUZE/BANDAGES/DRESSINGS) IMPLANT
SUT MNCRL AB 4-0 PS2 18 (SUTURE) ×3 IMPLANT
SUT VICRYL 0 UR6 27IN ABS (SUTURE) ×2 IMPLANT
TOWEL OR 17X24 6PK STRL BLUE (TOWEL DISPOSABLE) ×3 IMPLANT
TOWEL OR 17X26 10 PK STRL BLUE (TOWEL DISPOSABLE) ×3 IMPLANT
TRAY FOLEY CATH 16FR SILVER (SET/KITS/TRAYS/PACK) ×3 IMPLANT
TRAY LAPAROSCOPIC MC (CUSTOM PROCEDURE TRAY) ×3 IMPLANT
TROCAR XCEL BLADELESS 5X75MML (TROCAR) ×6 IMPLANT
TROCAR XCEL BLUNT TIP 100MML (ENDOMECHANICALS) ×3 IMPLANT
TUBING INSUFFLATION (TUBING) ×3 IMPLANT

## 2015-11-29 NOTE — Anesthesia Postprocedure Evaluation (Signed)
Anesthesia Post Note  Patient: Stephen Lambert  Procedure(s) Performed: Procedure(s) (LRB): APPENDECTOMY LAPAROSCOPIC (N/A)  Patient location during evaluation: PACU Anesthesia Type: General Level of consciousness: awake and alert Pain management: pain level controlled Vital Signs Assessment: post-procedure vital signs reviewed and stable Respiratory status: spontaneous breathing, nonlabored ventilation, respiratory function stable and patient connected to nasal cannula oxygen Cardiovascular status: blood pressure returned to baseline and stable Postop Assessment: no signs of nausea or vomiting Anesthetic complications: no    Last Vitals:  Vitals:   11/29/15 1430 11/29/15 1445  BP: (!) 111/50   Pulse: 62 62  Resp: 14 13  Temp:      Last Pain:  Vitals:   11/29/15 1445  TempSrc:   PainSc: Asleep                 Aibhlinn Kalmar J

## 2015-11-29 NOTE — Op Note (Signed)
Stephen Lambert [id number] 10/31/1960 11/29/2015  Appendectomy, Lap, Procedure Note  Indications: The patient presented with a history of right-sided abdominal pain. A CT revealed findings consistent with acute appendicitis.  Pre-operative Diagnosis: acute appendicitis  Post-operative Diagnosis: suppurative appendicitis  Surgeon: Atilano InaWILSON,Jamone Garrido M   Assistants: none  Anesthesia: General endotracheal anesthesia  Procedure Details  The patient was seen again in the Holding Room. The risks, benefits, complications, treatment options, and expected outcomes were discussed with the patient and/or family. The possibilities of perforation of viscus, bleeding, recurrent infection, the need for additional procedures, failure to diagnose a condition, and creating a complication requiring transfusion or operation were discussed. There was concurrence with the proposed plan and informed consent was obtained. The site of surgery was properly noted. The patient was taken to Operating Room, identified as Stephen Lambert and the procedure verified as Appendectomy. A Time Out was held and the above information confirmed.  The patient was placed in the supine position and general anesthesia was induced, along with placement of orogastric tube, SCDs, and a Foley catheter. The abdomen was prepped and draped in a sterile fashion. A 1.5 centimeter infraumbilical incision was made.  The umbilical stalk was elevated, and the midline fascia was incised with a #11 blade.  A Kelly clamp was used to confirm entrance into the peritoneal cavity.  A pursestring suture was passed around the incision with a 0 Vicryl.  A 12mm Hasson was introduced into the abdomen and the tails of the suture were used to hold the Hasson in place.   The pneumoperitoneum was then established to steady pressure of 15 mmHg.  Additional 5 mm cannulas then placed in the left lower quadrant of the abdomen and the suprapubic region under direct  visualization. A careful evaluation of the entire abdomen was carried out. The patient was placed in Trendelenburg and left lateral decubitus position. The small intestines were retracted in the cephalad and left lateral direction away from the pelvis and right lower quadrant. The patient was found to have an inflammed appendix that was extending into the pelvis. There was no evidence of perforation however there was green tinged fluid in the pelvis  The appendix was carefully dissected. The appendix was was skeletonized with the harmonic scalpel.   The appendix was divided at its base using an endo-GIA stapler with a white load. No appendiceal stump was left in place. The appendix was removed from the abdomen with an Ecco bag through the umbilical port.  There was no evidence of bleeding, leakage, or complication after division of the appendix. Irrigation was also performed and irrigate suctioned from the abdomen as well.  The umbilical port site was closed with the purse string suture. The closure was viewed laparoscopically. There was a small air leak so an additional interrupted 0 vicryl suture was placed using the PMI suture passer with laparoscopic assistance. There was no residual palpable fascial defect.  The trocar site skin wounds were closed with 4-0 Monocryl. Benzoin, steri-strips, and bandages were applied to the skin incisions.  Instrument, sponge, and needle counts were correct at the conclusion of the case.   Findings: The appendix was found to be inflamed. There were not signs of necrosis.  There was not perforation. There was not abscess formation. There was green fluid in the pelvis  Estimated Blood Loss:  Minimal         Drains: none         Specimens: appendix  Complications:  None; patient tolerated the procedure well.         Disposition: PACU - hemodynamically stable.         Condition: stable  Leighton Ruff. Redmond Pulling, MD, FACS General, Bariatric, & Minimally Invasive  Surgery [facility], [place]

## 2015-11-29 NOTE — Anesthesia Preprocedure Evaluation (Signed)
Anesthesia Evaluation  Patient identified by MRN, date of birth, ID band Patient awake    Reviewed: Allergy & Precautions, NPO status , Patient's Chart, lab work & pertinent test results  Airway Mallampati: II  TM Distance: >3 FB Neck ROM: Full    Dental no notable dental hx.    Pulmonary Current Smoker,    Pulmonary exam normal breath sounds clear to auscultation       Cardiovascular hypertension, Normal cardiovascular exam Rhythm:Regular Rate:Normal     Neuro/Psych negative neurological ROS  negative psych ROS   GI/Hepatic negative GI ROS, Neg liver ROS,   Endo/Other  negative endocrine ROS  Renal/GU negative Renal ROS  negative genitourinary   Musculoskeletal negative musculoskeletal ROS (+)   Abdominal (+) + obese,   Peds negative pediatric ROS (+)  Hematology negative hematology ROS (+)   Anesthesia Other Findings   Reproductive/Obstetrics negative OB ROS                             Anesthesia Physical Anesthesia Plan  ASA: II  Anesthesia Plan: General   Post-op Pain Management:    Induction: Intravenous  Airway Management Planned: Oral ETT  Additional Equipment:   Intra-op Plan:   Post-operative Plan: Extubation in OR  Informed Consent: I have reviewed the patients History and Physical, chart, labs and discussed the procedure including the risks, benefits and alternatives for the proposed anesthesia with the patient or authorized representative who has indicated his/her understanding and acceptance.   Dental advisory given  Plan Discussed with: CRNA  Anesthesia Plan Comments:         Anesthesia Quick Evaluation

## 2015-11-29 NOTE — ED Triage Notes (Signed)
Patient with reported onset of upper abd pain and now lower abd pain.  States he has pain that shoots up into his chest and at times into his rectum.  He reports belching and gas.  Patient with no vomitting but he has had nausea.  Patient is alert.  Skin warm and dry.  He reports normal bm yesterday.  Denies any blood in stools.  Patient denies trauma.  He thinks he may have eaten something bad.  He denies any back pain or sob

## 2015-11-29 NOTE — ED Provider Notes (Signed)
[facility]-EMERGENCY DEPT Provider Note   CSN: [id number] Arrival date & time: 11/29/15  0413     History   Chief Complaint Chief Complaint  Patient presents with  . Abdominal Pain  . Chest Pain  . Nausea    HPI Stephen Lambert is a 55 y.o. male.  The history is provided by the patient.  Abdominal Pain   This is a new problem. The current episode started 3 to 5 hours ago. The problem occurs constantly. The problem has been gradually worsening. The pain is located in the generalized abdominal region. The pain is severe. Associated symptoms include nausea. Pertinent negatives include fever, diarrhea, hematochezia, melena and dysuria. The symptoms are aggravated by palpation. Nothing relieves the symptoms.  Patient reports acute onset of diffuse abdominal pain about 5 hrs ago that radiates throughout abdomen and into rectum It is worsening It was worse while driving in the car and with movement in the bed No fever but reports chills He reports pain will radiate throughout abdomen and into chest  He does not recall having this pain previously  Surg hx - previous hernia repair Past Medical History:  Diagnosis Date  . Back pain, chronic    2007 fell off truck  . Hernia    Rt. Groin - repaired 1997  . HTN (hypertension)     Patient Active Problem List   Diagnosis Date Noted  . HTN (hypertension) 05/20/2012  . Tobacco abuse counseling 05/20/2012  . Chronic back pain 05/20/2012  . At high risk for falls 05/20/2012    Past Surgical History:  Procedure Laterality Date  . HERNIA REPAIR     1997       Home Medications    Prior to Admission medications   Medication Sig Start Date End Date Taking? Authorizing Provider  lisinopril-hydrochlorothiazide (PRINZIDE,ZESTORETIC) 20-25 MG tablet Take 1 tablet by mouth daily. 03/10/15  Yes Joanna Puff, MD  oxyCODONE (OXY IR/ROXICODONE) 5 MG immediate release tablet Take 5 mg by mouth 3 (three) times daily. 11/06/15  Yes Historical  Provider, MD  nicotine (NICODERM CQ - DOSED IN MG/24 HR) 7 mg/24hr patch Place 1 patch (7 mg total) onto the skin daily. Patient not taking: Reported on 11/29/2015 09/21/14   Pincus Large, DO    Family History Family History  Problem Relation Age of Onset  . Cancer Mother     lung cancer 76  . Hypertension Father   . Diabetes Father     Social History Social History  Substance Use Topics  . Smoking status: Current Every Day Smoker    Packs/day: 0.25    Years: 30.00    Types: Cigarettes  . Smokeless tobacco: Never Used     Comment: quit from 2002-2007  . Alcohol use No     Allergies   Review of patient's allergies indicates no known allergies.   Review of Systems Review of Systems  Constitutional: Positive for chills. Negative for fever.  Respiratory: Negative for shortness of breath.   Gastrointestinal: Positive for nausea. Negative for blood in stool, diarrhea, hematochezia and melena.  Genitourinary: Negative for dysuria.  All other systems reviewed and are negative.    Physical Exam Updated Vital Signs BP 130/64   Pulse 64   Temp 98.2 F (36.8 C) (Oral)   Resp 24   Wt 103 kg   SpO2 97%   BMI 30.80 kg/m   Physical Exam CONSTITUTIONAL: Well developed/well nourished HEAD: Normocephalic/atraumatic EYES: EOMI/PERRL, no icterus ENMT: Mucous membranes moist  NECK: supple no meningeal signs CV: S1/S2 noted, no murmurs/rubs/gallops noted LUNGS: Lungs are clear to auscultation bilaterally, no apparent distress ABDOMEN: soft, significant/severe tenderness to LLQ and RLQ with guarding noted.   GU:no cva tenderness, no hernia noted, no scrotal tenderness, significant other at bedside per patient request NEURO: Pt is awake/alert/appropriate, moves all extremitiesx4.  No facial droop.   EXTREMITIES: pulses normal/equal, full ROM SKIN: warm, color normal PSYCH: no abnormalities of mood noted, alert and oriented to situation   ED Treatments / Results   Labs (all labs ordered are listed, but only abnormal results are displayed) Labs Reviewed  BASIC METABOLIC PANEL - Abnormal; Notable for the following:       Result Value   Chloride 100 (*)    Glucose, Bld 133 (*)    All other components within normal limits  CBC - Abnormal; Notable for the following:    WBC 16.2 (*)    All other components within normal limits  LIPASE, BLOOD  URINALYSIS, ROUTINE W REFLEX MICROSCOPIC (NOT AT [facility])  Rosezena SensorI-STAT TROPOININ, ED    EKG  EKG Interpretation  Date/Time:  Wednesday November 29 2015 04:21:25 EDT Ventricular Rate:  59 PR Interval:  166 QRS Duration: 96 QT Interval:  384 QTC Calculation: 380 R Axis:   79 Text Interpretation:  Sinus bradycardia Otherwise normal ECG No previous ECGs available Confirmed by Bebe ShaggyWICKLINE  MD, Zina Pitzer ([id number]) on 11/29/2015 4:33:54 AM       Radiology Dg Chest 2 View  Result Date: 11/29/2015 CLINICAL DATA:  55 y/o  M; eft and abdomen pain. EXAM: CHEST  2 VIEW COMPARISON:  08/09/2014 chest radiograph. FINDINGS: The heart size and mediastinal contours are within normal limits and stable. Both lungs are clear. The visualized skeletal structures are unremarkable. IMPRESSION: No active cardiopulmonary disease. Electronically Signed   By: Mitzi HansenLance  Furusawa-Stratton M.D.   On: 11/29/2015 05:32    Procedures Procedures (including critical care time)  Medications Ordered in ED Medications  ondansetron (ZOFRAN-ODT) 4 MG disintegrating tablet (not administered)  iopamidol (ISOVUE-300) 61 % injection (not administered)  HYDROmorphone (DILAUDID) injection 1 mg (not administered)  ondansetron (ZOFRAN-ODT) disintegrating tablet 4 mg (4 mg Oral Given 11/29/15 0429)  fentaNYL (SUBLIMAZE) injection 100 mcg (100 mcg Intravenous Given 11/29/15 0559)  ondansetron (ZOFRAN) injection 4 mg (4 mg Intravenous Given 11/29/15 0559)  sodium chloride 0.9 % bolus 1,000 mL (1,000 mLs Intravenous New Bag/Given 11/29/15 0559)  iopamidol  (ISOVUE-300) 61 % injection 100 mL (100 mLs Intravenous Contrast Given 11/29/15 0640)     Initial Impression / Assessment and Plan / ED Course  I have reviewed the triage vital signs and the nursing notes.  Pertinent labs & imaging results that were available during my care of the patient were reviewed by me and considered in my medical decision making (see chart for details).  Clinical Course    Pt with significant LLQ/RLQ that was sudden in onset I am concern for potential acute abdominal pathology Will proceed with CT imaging 7:08 AM At signout to dr Erma Heritageisaacs, f/u on Ct imaging Pt still reporting abd pain   Final Clinical Impressions(s) / ED Diagnoses   Final diagnoses:  None    New Prescriptions New Prescriptions   No medications on file     Zadie Rhineonald Keston Seever, MD 11/29/15 0710

## 2015-11-29 NOTE — Progress Notes (Signed)
Patient's temp 101.2 on arrival to short stay.  Notified Dr. Council Mechanicenenny.

## 2015-11-29 NOTE — ED Provider Notes (Signed)
Assumed care from Dr. Bebe ShaggyWickline at 7 Am. Briefly, the patient is a 55 yo M with no significant PMHx here with generalized abdominal pain radiating to rectum. HDS but with leukocytosis fo 16.2k. CT pending. Plan to f/u CT, dispo accordingly. Pain meds, IVF given..   Labs Reviewed  BASIC METABOLIC PANEL - Abnormal; Notable for the following:       Result Value   Chloride 100 (*)    Glucose, Bld 133 (*)    All other components within normal limits  CBC - Abnormal; Notable for the following:    WBC 16.2 (*)    All other components within normal limits  LIPASE, BLOOD  URINALYSIS, ROUTINE W REFLEX MICROSCOPIC (NOT AT [facility])  I-STAT TROPOININ, ED    Course of Care: -CT scan shows acute uncomplicated appendicitis. Discussed with Dr. Andrey CampanileWilson of surgery and will take to the OR for appendectomy. Patient nothing by mouth. He is in agreement with this plan.  Clinical Impression: 1. Acute appendicitis with localized peritonitis     Disposition: OR      Shaune Pollackameron Ameliana Brashear, MD 11/29/15 [phone]

## 2015-11-29 NOTE — Anesthesia Procedure Notes (Signed)
Procedure Name: Intubation Date/Time: 11/29/2015 11:39 AM Performed by: Glo HerringLEE, Rafay Dahan B Pre-anesthesia Checklist: Patient identified, Emergency Drugs available, Suction available, Patient being monitored and Timeout performed Patient Re-evaluated:Patient Re-evaluated prior to inductionOxygen Delivery Method: Circle system utilized Preoxygenation: Pre-oxygenation with 100% oxygen Intubation Type: IV induction, Rapid sequence and Cricoid Pressure applied Laryngoscope Size: Mac and 4 Grade View: Grade II Tube type: Oral Tube size: 7.5 mm Number of attempts: 1 Airway Equipment and Method: Stylet Placement Confirmation: ETT inserted through vocal cords under direct vision,  positive ETCO2,  CO2 detector and breath sounds checked- equal and bilateral Secured at: 24 cm Tube secured with: Tape Dental Injury: Teeth and Oropharynx as per pre-operative assessment

## 2015-11-29 NOTE — H&P (Signed)
[facility] Admission Note  Stephen Lambert 1960-03-16  [id number].    Requesting MD: Ellender Hose, MD Chief Complaint/Reason for Consult: acute appendicitis  HPI:  55 year old male with a PMH of hypertension, tobacco abuse, and chronic back pain who presented to [facility] with abdominal pain that started last night at 11 PM. Pain described as sharp, lower abdominal pain, previously radiating to the rectum, but now limited to bilateral lower quadrants. Rates pain 8/10. Associated symptoms include nausea. Denies any aggravating or alleviating factors. Denies sick contacts, fever, chills, CP, SOB, vomiting, diarrhea, melena, hematochezia, and urinary symptoms. Past surgeries include right inguinal hernia repair (1997). Denies use of blood thinning medications. NKDA. Smokes 1/2 ppd. Denies alcohol use.  ED workup significant for WBC 16.2, fever 102, and CT abd/pelvis with a dilated (14 cm) appendix without perforation.  ROS: All systems reviewed and otherwise negative except for as above  Family History  Problem Relation Age of Onset  . Cancer Mother     lung cancer 51  . Hypertension Father   . Diabetes Father     Past Medical History:  Diagnosis Date  . Back pain, chronic    2007 fell off truck  . Hernia    Rt. Groin - repaired 1997  . HTN (hypertension)     Past Surgical History:  Procedure Laterality Date  . HERNIA REPAIR     1997    Social History:  reports that he has been smoking Cigarettes.  He has a 7.50 pack-year smoking history. He has never used smokeless tobacco. He reports that he does not drink alcohol or use drugs.  Allergies: No Known Allergies   (Not in a hospital admission)  Blood pressure 151/73, pulse 78, temperature (S) 102.3 F (39.1 C), temperature source Oral, resp. rate 16, weight 227 lb 1 oz (103 kg), SpO2 97 %. Physical Exam: General: pleasant, AA male who is laying in bed in NAD HEENT: head is normocephalic, atraumatic.  Mouth is pink  and moist Heart: regular, rate, and rhythm.  No obvious murmurs, gallops, or rubs noted.  Palpable pedal pulses bilaterally Lungs: CTAB, no wheezes, rhonchi, or rales noted.  Respiratory effort nonlabored Abd: soft, TTP RLQ, LLQ, and suprapubic region, ND, +BS, no masses, hernias, or organomegaly; no guarding or peritonitis MS: all 4 extremities are symmetrical with no cyanosis, clubbing, or edema. Skin: warm and dry with no masses, lesions, or rashes Psych: appropriate affect. Neuro: A&ox3, normal speech  Results for orders placed or performed during the hospital encounter of 11/29/15 (from the past 48 hour(s))  Basic metabolic panel     Status: Abnormal   Collection Time: 11/29/15  4:31 AM  Result Value Ref Range   Sodium 140 135 - 145 mmol/L   Potassium 3.7 3.5 - 5.1 mmol/L   Chloride 100 (L) 101 - 111 mmol/L   CO2 30 22 - 32 mmol/L   Glucose, Bld 133 (H) 65 - 99 mg/dL   BUN 16 6 - 20 mg/dL   Creatinine, Ser 1.22 0.61 - 1.24 mg/dL   Calcium 9.8 8.9 - 10.3 mg/dL   GFR calc non Af Amer >60 >60 mL/min   GFR calc Af Amer >60 >60 mL/min    Comment: (NOTE) The eGFR has been calculated using the CKD EPI equation. This calculation has not been validated in all clinical situations. eGFR's persistently <60 mL/min signify possible Chronic Kidney Disease.    Anion gap 10 5 - 15  CBC  Status: Abnormal   Collection Time: 11/29/15  4:31 AM  Result Value Ref Range   WBC 16.2 (H) 4.0 - 10.5 K/uL   RBC 4.95 4.22 - 5.81 MIL/uL   Hemoglobin 13.2 13.0 - 17.0 g/dL   HCT 39.7 39.0 - 52.0 %   MCV 80.2 78.0 - 100.0 fL   MCH 26.7 26.0 - 34.0 pg   MCHC 33.2 30.0 - 36.0 g/dL   RDW 13.8 11.5 - 15.5 %   Platelets 251 150 - 400 K/uL  Lipase, blood     Status: None   Collection Time: 11/29/15  4:31 AM  Result Value Ref Range   Lipase 15 11 - 51 U/L  Urinalysis, Routine w reflex microscopic     Status: None   Collection Time: 11/29/15  4:33 AM  Result Value Ref Range   Color, Urine YELLOW  YELLOW   APPearance CLEAR CLEAR   Specific Gravity, Urine 1.024 1.005 - 1.030   pH 6.0 5.0 - 8.0   Glucose, UA NEGATIVE NEGATIVE mg/dL   Hgb urine dipstick NEGATIVE NEGATIVE   Bilirubin Urine NEGATIVE NEGATIVE   Ketones, ur NEGATIVE NEGATIVE mg/dL   Protein, ur NEGATIVE NEGATIVE mg/dL   Nitrite NEGATIVE NEGATIVE   Leukocytes, UA NEGATIVE NEGATIVE    Comment: MICROSCOPIC NOT DONE ON URINES WITH NEGATIVE PROTEIN, BLOOD, LEUKOCYTES, NITRITE, OR GLUCOSE <1000 mg/dL.  I-stat troponin, ED     Status: None   Collection Time: 11/29/15  4:40 AM  Result Value Ref Range   Troponin i, poc 0.00 0.00 - 0.08 ng/mL   Comment 3            Comment: Due to the release kinetics of cTnI, a negative result within the first hours of the onset of symptoms does not rule out myocardial infarction with certainty. If myocardial infarction is still suspected, repeat the test at appropriate intervals.    Dg Chest 2 View  Result Date: 11/29/2015 CLINICAL DATA:  55 y/o  M; eft and abdomen pain. EXAM: CHEST  2 VIEW COMPARISON:  08/09/2014 chest radiograph. FINDINGS: The heart size and mediastinal contours are within normal limits and stable. Both lungs are clear. The visualized skeletal structures are unremarkable. IMPRESSION: No active cardiopulmonary disease. Electronically Signed   By: Kristine Garbe M.D.   On: 11/29/2015 05:32   Ct Abdomen Pelvis W Contrast  Result Date: 11/29/2015 CLINICAL DATA:  55 year old male with diffuse abdominal pain since yesterday evening, worsening today. Nausea. EXAM: CT ABDOMEN AND PELVIS WITH CONTRAST TECHNIQUE: Multidetector CT imaging of the abdomen and pelvis was performed using the standard protocol following bolus administration of intravenous contrast. CONTRAST:  163m ISOVUE-300 IOPAMIDOL (ISOVUE-300) INJECTION 61% COMPARISON:  CT the abdomen pelvis 06/24/2008. FINDINGS: Lower chest: Unremarkable. Hepatobiliary: No cystic or solid hepatic lesions. No intra or  extrahepatic biliary ductal dilatation. Gallbladder is normal in appearance. Pancreas: No pancreatic mass. No pancreatic ductal dilatation. No pancreatic or peripancreatic fluid or inflammatory changes. Spleen: Unremarkable. Adrenals/Urinary Tract: There are several well-defined low-attenuation lesions which do not enhance in the kidneys bilaterally, compatible with simple cysts, measuring up to 3.6 cm in the posterior aspect of the interpolar region of the right kidney. Other sub cm low-attenuation lesions in the kidneys bilaterally too small to definitively characterize, but are favored to represent cysts. No hydroureteronephrosis. Urinary bladder is normal in appearance. Right adrenal gland is normal in appearance. There are small nodules in the left adrenal gland measuring up to 1.4 cm in the medial limb, similar to  prior study 06/24/2008; although these are incompletely characterized on today's contrast enhanced examination, these are statistically likely to represent small adenomas. Stomach/Bowel: The appearance of the stomach is normal. There is no pathologic dilatation of small bowel or colon. The appendix is dilated measuring up to 14 mm in diameter, and is surrounded by inflammatory changes. The appendix is located in the central aspect of the anatomic pelvis, best appreciated on axial image 61 of series 201 and coronal image 70 of series 203. Trace amount of adjacent free fluid is presumably reactive. No overt signs to suggest frank perforation are noted at this time. 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 No aneurysm or dissection identified in the abdominal or pelvic vasculature. No lymphadenopathy noted in the abdomen or pelvis. Reproductive: Prostate gland and seminal vesicles are unremarkable in appearance. Other: Trace volume of free fluid in the low anatomic pelvis, presumably reactive to appendiceal inflammation. No pneumoperitoneum. Musculoskeletal: There are no aggressive  appearing lytic or blastic lesions noted in the visualized portions of the skeleton. IMPRESSION: 1. 1. Findings are compatible with an acute appendicitis, as discussed above. Small amount of adjacent reactive free fluid and surrounding inflammatory changes are noted associated with the inflamed appendix. No overt signs to suggest frank perforation are noted at this time. 2. Additional incidental findings, as above. Critical Value/emergent results were called by telephone at the time of interpretation on 11/29/2015 at 7:26 am to Dr. Ellender Hose, who verbally acknowledged these results. Electronically Signed   By: Vinnie Langton M.D.   On: 11/29/2015 07:27   Assessment/Plan Acute appendicitis without perforation Leukocytosis - 16.2 - NPO, IVF  - IV Zosyn - antiemetics, analgesics - OR for laparoscopic appendectomy by Dr. Greer Pickerel   Hypertension Chronic back pain  Jill Alexanders, [facility] 11/29/2015, 8:22 AM Pager: [phone] Consults: [phone] Mon-Fri 7:00 am-4:30 pm Sat-Sun 7:00 am-11:30 am

## 2015-11-29 NOTE — Transfer of Care (Signed)
Immediate Anesthesia Transfer of Care Note  Patient: Leota Jacobsenerry J Nicole  Procedure(s) Performed: Procedure(s): APPENDECTOMY LAPAROSCOPIC (N/A)  Patient Location: PACU  Anesthesia Type:General  Level of Consciousness: patient cooperative  Airway & Oxygen Therapy: Patient Spontanous Breathing and Patient connected to face mask oxygen  Post-op Assessment: Report given to RN and Post -op Vital signs reviewed and stable  Post vital signs: Reviewed and stable  Last Vitals:  Vitals:   11/29/15 1000 11/29/15 1027  BP: 124/77   Pulse: 75   Resp: 17   Temp:  (!) 38.4 C    Last Pain:  Vitals:   11/29/15 1027  TempSrc: Oral  PainSc:          Complications: No apparent anesthesia complications

## 2015-11-30 ENCOUNTER — Encounter (HOSPITAL_COMMUNITY): Payer: Self-pay | Admitting: General Surgery

## 2015-11-30 DIAGNOSIS — K3580 Unspecified acute appendicitis: Secondary | ICD-10-CM

## 2015-11-30 LAB — CBC
HEMATOCRIT: 33 % — AB (ref 39.0–52.0)
HEMOGLOBIN: 10.7 g/dL — AB (ref 13.0–17.0)
MCH: 26.2 pg (ref 26.0–34.0)
MCHC: 32.4 g/dL (ref 30.0–36.0)
MCV: 80.7 fL (ref 78.0–100.0)
Platelets: 209 10*3/uL (ref 150–400)
RBC: 4.09 MIL/uL — ABNORMAL LOW (ref 4.22–5.81)
RDW: 14.2 % (ref 11.5–15.5)
WBC: 12.4 10*3/uL — AB (ref 4.0–10.5)

## 2015-11-30 MED ORDER — PNEUMOCOCCAL VAC POLYVALENT 25 MCG/0.5ML IJ INJ: 0.5000 mL | INJECTION | INTRAMUSCULAR | Status: DC

## 2015-11-30 MED ORDER — OXYCODONE HCL 5 MG PO TABS: 5.0000 mg | ORAL_TABLET | ORAL | 0 refills | Status: DC | PRN

## 2015-11-30 MED ORDER — AMOXICILLIN-POT CLAVULANATE 875-125 MG PO TABS
1.0000 | ORAL_TABLET | Freq: Two times a day (BID) | ORAL | 1 refills | Status: DC
Start: 2015-11-30 — End: 2017-07-02

## 2015-11-30 NOTE — Progress Notes (Signed)
Pt discharged home with wife in stable condition. Discharge education provided with no concerns expressed. AVS and discharge scripts given before leaving unit

## 2015-11-30 NOTE — Discharge Summary (Signed)
[facility] Discharge Summary   Patient ID: Stephen Lambert MRN: [id number] DOB/AGE: 09/22/1960 55 y.o.  Admit date: 11/29/2015 Discharge date: 11/30/2015  Admitting Diagnosis: Acute appendicitis   Discharge Diagnosis Patient Active Problem List   Diagnosis Date Noted  . Acute appendicitis 11/30/2015  . HTN (hypertension) 05/20/2012  . Tobacco abuse counseling 05/20/2012  . Chronic back pain 05/20/2012  . At high risk for falls 05/20/2012    Consultants None   Imaging: Dg Chest 2 View  Result Date: 11/29/2015 CLINICAL DATA:  55 y/o  M; eft and abdomen pain. EXAM: CHEST  2 VIEW COMPARISON:  08/09/2014 chest radiograph. FINDINGS: The heart size and mediastinal contours are within normal limits and stable. Both lungs are clear. The visualized skeletal structures are unremarkable. IMPRESSION: No active cardiopulmonary disease. Electronically Signed   By: Mitzi Hansen M.D.   On: 11/29/2015 05:32   Ct Abdomen Pelvis W Contrast  Result Date: 11/29/2015 CLINICAL DATA:  55 year old male with diffuse abdominal pain since yesterday evening, worsening today. Nausea. EXAM: CT ABDOMEN AND PELVIS WITH CONTRAST TECHNIQUE: Multidetector CT imaging of the abdomen and pelvis was performed using the standard protocol following bolus administration of intravenous contrast. CONTRAST:  ISOVUE-300 IOPAMIDOL (ISOVUE-300) INJECTION 61% COMPARISON:  CT the abdomen pelvis 06/24/2008. FINDINGS: Lower chest: Unremarkable. Hepatobiliary: No cystic or solid hepatic lesions. No intra or extrahepatic biliary ductal dilatation. Gallbladder is normal in appearance. Pancreas: No pancreatic mass. No pancreatic ductal dilatation. No pancreatic or peripancreatic fluid or inflammatory changes. Spleen: Unremarkable. Adrenals/Urinary Tract: There are several well-defined low-attenuation lesions which do not enhance in the kidneys bilaterally, compatible with simple cysts, measuring up to 3.6 cm  in the posterior aspect of the interpolar region of the right kidney. Other sub cm low-attenuation lesions in the kidneys bilaterally too small to definitively characterize, but are favored to represent cysts. No hydroureteronephrosis. Urinary bladder is normal in appearance. Right adrenal gland is normal in appearance. There are small nodules in the left adrenal gland measuring up to 1.4 cm in the medial limb, similar to prior study 06/24/2008; although these are incompletely characterized on today's contrast enhanced examination, these are statistically likely to represent small adenomas. Stomach/Bowel: The appearance of the stomach is normal. There is no pathologic dilatation of small bowel or colon. The appendix is dilated measuring up to 14 mm in diameter, and is surrounded by inflammatory changes. The appendix is located in the central aspect of the anatomic pelvis, best appreciated on axial image 61 of series 201 and coronal image 70 of series 203. Trace amount of adjacent free fluid is presumably reactive. No overt signs to suggest frank perforation are noted at this time. 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 No aneurysm or dissection identified in the abdominal or pelvic vasculature. No lymphadenopathy noted in the abdomen or pelvis. Reproductive: Prostate gland and seminal vesicles are unremarkable in appearance. Other: Trace volume of free fluid in the low anatomic pelvis, presumably reactive to appendiceal inflammation. No pneumoperitoneum. Musculoskeletal: There are no aggressive appearing lytic or blastic lesions noted in the visualized portions of the skeleton. IMPRESSION: 1. 1. Findings are compatible with an acute appendicitis, as discussed above. Small amount of adjacent reactive free fluid and surrounding inflammatory changes are noted associated with the inflamed appendix. No overt signs to suggest frank perforation are noted at this time. 2. Additional incidental  findings, as above. Critical Value/emergent results were called by telephone at the time of interpretation on 11/29/2015 at 7:26 am to Dr.  Isaacs, who verbally acknowledged these results. Electronically Signed   By: Trudie Reedaniel  Entrikin M.D.   On: 11/29/2015 07:27    Procedures Dr. Gaynelle AduEric Wilson (11/29/15) - Laparoscopic Appendectomy  Hospital Course:  55 y.o. Male with a PMH HTN, tobacco abuse, and chronic back pain who [facility] with lower abdominal pain.  CT scan significant for signs of acute appendicitis without perforation. The patient underwent the procedure above, during when time he was found to have suppurative appendicitis with a small amount of fluid leakage from the appendix.  Tolerated procedure well and was transferred to the floor. IV antibiotics were continued during the patients hospital stay.  Diet was advanced as tolerated.  On POD#1, the patient was voiding well, tolerating diet, ambulating well, pain well controlled, vital signs stable, incisions c/d/i and felt stable for discharge home.  Patient will follow up in our office in 2 weeks and knows to call with questions or concerns.   Physical Exam: General:  Alert, NAD, pleasant, comfortable Abd:  Soft, ND, mild tenderness, incisions C/D/I  Follow-up Information    Atilano InaWILSON,ERIC M, MD Follow up in 2 week(s).   Specialty:  General Surgery Contact information: [address] [place] [phone]           Signed: Hosie SpangleElizabeth Simaan, [facility] 11/30/2015, 3:27 PM Pager: [phone] Consults: [phone] Mon-Fri 7:00 am-4:30 pm Sat-Sun 7:00 am-11:30 am

## 2015-11-30 NOTE — Progress Notes (Signed)
Ambulated with pt on unit, was able to do a fifth of a lap. C/o pain and says that he thinks it might be a good idea to stay overnight. MD text paged

## 2015-11-30 NOTE — Discharge Instructions (Signed)

## 2015-11-30 NOTE — Progress Notes (Signed)
1 Day Post-Op  Subjective: Having some soreness but feels ok  Objective: Vital signs in last 24 hours: Temp:  [98.7 F (37.1 C)-101.2 F (38.4 C)] 98.7 F (37.1 C) (10/12 0509) Pulse Rate:  [56-85] 56 (10/12 0509) Resp:  [11-18] 16 (10/12 0509) BP: (107-140)/(50-77) 140/71 (10/12 0509) SpO2:  [89 %-99 %] 99 % (10/12 0509) Weight:  [103 kg (227 lb)] 103 kg (227 lb) (10/11 1600) Last BM Date: 11/29/15  Intake/Output from previous day: 10/11 0701 - 10/12 0700 In: 2940 [P.O.:240; I.V.:1600; IV Piggyback:1100] Out: 420 [Urine:400; Blood:20] Intake/Output this shift: No intake/output data recorded.  Resp: clear to auscultation bilaterally Cardio: regular rate and rhythm GI: soft, minimal tenderness. incisions ok  Lab Results:   Recent Labs  11/29/15 0431 11/30/15 0459  WBC 16.2* 12.4*  HGB 13.2 10.7*  HCT 39.7 33.0*  PLT 251 209   BMET  Recent Labs  11/29/15 0431  NA 140  K 3.7  CL 100*  CO2 30  GLUCOSE 133*  BUN 16  CREATININE 1.22  CALCIUM 9.8   PT/INR No results for input(s): LABPROT, INR in the last 72 hours. ABG No results for input(s): PHART, HCO3 in the last 72 hours.  Invalid input(s): PCO2, PO2  Studies/Results: Dg Chest 2 View  Result Date: 11/29/2015 CLINICAL DATA:  55 y/o  M; eft and abdomen pain. EXAM: CHEST  2 VIEW COMPARISON:  08/09/2014 chest radiograph. FINDINGS: The heart size and mediastinal contours are within normal limits and stable. Both lungs are clear. The visualized skeletal structures are unremarkable. IMPRESSION: No active cardiopulmonary disease. Electronically Signed   By: Mitzi Hansen M.D.   On: 11/29/2015 05:32   Ct Abdomen Pelvis W Contrast  Result Date: 11/29/2015 CLINICAL DATA:  55 year old male with diffuse abdominal pain since yesterday evening, worsening today. Nausea. EXAM: CT ABDOMEN AND PELVIS WITH CONTRAST TECHNIQUE: Multidetector CT imaging of the abdomen and pelvis was performed using the standard  protocol following bolus administration of intravenous contrast. CONTRAST:  ISOVUE-300 IOPAMIDOL (ISOVUE-300) INJECTION 61% COMPARISON:  CT the abdomen pelvis 06/24/2008. FINDINGS: Lower chest: Unremarkable. Hepatobiliary: No cystic or solid hepatic lesions. No intra or extrahepatic biliary ductal dilatation. Gallbladder is normal in appearance. Pancreas: No pancreatic mass. No pancreatic ductal dilatation. No pancreatic or peripancreatic fluid or inflammatory changes. Spleen: Unremarkable. Adrenals/Urinary Tract: There are several well-defined low-attenuation lesions which do not enhance in the kidneys bilaterally, compatible with simple cysts, measuring up to 3.6 cm in the posterior aspect of the interpolar region of the right kidney. Other sub cm low-attenuation lesions in the kidneys bilaterally too small to definitively characterize, but are favored to represent cysts. No hydroureteronephrosis. Urinary bladder is normal in appearance. Right adrenal gland is normal in appearance. There are small nodules in the left adrenal gland measuring up to 1.4 cm in the medial limb, similar to prior study 06/24/2008; although these are incompletely characterized on today's contrast enhanced examination, these are statistically likely to represent small adenomas. Stomach/Bowel: The appearance of the stomach is normal. There is no pathologic dilatation of small bowel or colon. The appendix is dilated measuring up to 14 mm in diameter, and is surrounded by inflammatory changes. The appendix is located in the central aspect of the anatomic pelvis, best appreciated on axial image 61 of series 201 and coronal image 70 of series 203. Trace amount of adjacent free fluid is presumably reactive. No overt signs to suggest frank perforation are noted at this time. 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  No aneurysm or dissection identified in the abdominal or pelvic vasculature. No lymphadenopathy noted in  the abdomen or pelvis. Reproductive: Prostate gland and seminal vesicles are unremarkable in appearance. Other: Trace volume of free fluid in the low anatomic pelvis, presumably reactive to appendiceal inflammation. No pneumoperitoneum. Musculoskeletal: There are no aggressive appearing lytic or blastic lesions noted in the visualized portions of the skeleton. IMPRESSION: 1. 1. Findings are compatible with an acute appendicitis, as discussed above. Small amount of adjacent reactive free fluid and surrounding inflammatory changes are noted associated with the inflamed appendix. No overt signs to suggest frank perforation are noted at this time. 2. Additional incidental findings, as above. Critical Value/emergent results were called by telephone at the time of interpretation on 11/29/2015 at 7:26 am to Dr. Erma HeritageIsaacs, who verbally acknowledged these results. Electronically Signed   By: Trudie Reedaniel  Entrikin M.D.   On: 11/29/2015 07:27    Anti-infectives: Anti-infectives    Start     Dose/Rate Route Frequency Ordered Stop   11/29/15 1700  piperacillin-tazobactam (ZOSYN) IVPB 3.375 g     3.375 g 12.5 mL/hr over 240 Minutes Intravenous Every 8 hours 11/29/15 1602 11/30/15 0929   11/29/15 0845  piperacillin-tazobactam (ZOSYN) IVPB 3.375 g  Status:  Discontinued     3.375 g 12.5 mL/hr over 240 Minutes Intravenous Every 8 hours 11/29/15 0844 11/29/15 1556      Assessment/Plan: s/p Procedure(s): APPENDECTOMY LAPAROSCOPIC (N/A) Advance diet Discharge  LOS: 0 days    TOTH III,PAUL S 11/30/2015

## 2016-03-25 ENCOUNTER — Other Ambulatory Visit: Payer: Self-pay | Admitting: *Deleted

## 2016-03-26 MED ORDER — LISINOPRIL-HYDROCHLOROTHIAZIDE 20-25 MG PO TABS: 1.0000 | ORAL_TABLET | Freq: Every day | ORAL | 1 refills | Status: DC

## 2016-09-25 ENCOUNTER — Other Ambulatory Visit: Payer: Self-pay | Admitting: Obstetrics and Gynecology

## 2017-03-31 ENCOUNTER — Other Ambulatory Visit: Payer: Self-pay | Admitting: Family Medicine

## 2017-03-31 NOTE — Telephone Encounter (Signed)
Attempted to call patient but there was no answer or voicemail.  Will try to reach him a few more times and then I will send a letter asking him to contact us to make an appointment. Lamya Lausch,CMA

## 2017-03-31 NOTE — Telephone Encounter (Signed)
I'll fill this for another month but I'd like to see him in clinic, he hasn't been seen by us since 02/2015

## 2017-04-27 ENCOUNTER — Other Ambulatory Visit: Payer: Self-pay | Admitting: Family Medicine

## 2017-04-28 NOTE — Telephone Encounter (Signed)
Needs appointment. Has not been seen here in over 2 years. Thanks

## 2017-04-28 NOTE — Telephone Encounter (Signed)
Tried calling patient but number just rang and there was no option for voicemail.  Will mail a letter asking patient to please contact our office and schedule an appointment.  Jazmin Hartsell,CMA

## 2017-05-22 ENCOUNTER — Other Ambulatory Visit: Payer: Self-pay | Admitting: Family Medicine

## 2017-05-23 NOTE — Telephone Encounter (Signed)
Refill denied. Per last refill request documentation PCP required patient appointment as [facility]. A letter was mailed to his house at that time. Perhaps we can attempt to contact him again.

## 2017-05-23 NOTE — Telephone Encounter (Signed)
Tried calling patient twice but number is not in service.  Mailed another letter. Khyrie Masi,CMA

## 2017-06-04 ENCOUNTER — Other Ambulatory Visit: Payer: Self-pay | Admitting: Family Medicine

## 2017-06-04 ENCOUNTER — Encounter: Payer: Medicare Other | Admitting: Family Medicine

## 2017-06-04 MED ORDER — LISINOPRIL-HYDROCHLOROTHIAZIDE 20-25 MG PO TABS: 1.0000 | ORAL_TABLET | Freq: Every day | ORAL | 0 refills | Status: DC

## 2017-06-04 NOTE — Telephone Encounter (Signed)
Patient came by office requesting refill on RX lisinopril he is totally out. Patient has appt. Scheduled 07/02/17 he missed appt. This morning due to no reminder call. Updated phone number. [facility] on [address]. Please let patient know when ready.  [phone]

## 2017-06-04 NOTE — Telephone Encounter (Signed)
Pt made an appointment for 07-02-17. Stephen Lambert,CMA

## 2017-06-17 IMAGING — CT CT ABD-PELV W/ CM
2 of 5 series · 9 of 46 positions shown, 10 images · IV contrast (Iodine)
Comparison: CT the abdomen pelvis 06/24/2008.

CLINICAL DATA: 55-year-old male with diffuse abdominal pain since
yesterday evening, worsening today. Nausea.

EXAM:
CT ABDOMEN AND PELVIS WITH CONTRAST
TECHNIQUE: Multidetector CT imaging of the abdomen and pelvis was performed
using the standard protocol following bolus administration of
intravenous contrast.
CONTRAST:  100mL JNYUIH-TJJ IOPAMIDOL (JNYUIH-TJJ) INJECTION 61%

[Series 201: routine, idose (2) · axial · 0.81mm/px · z∈[+13,+408]mm · 6 of 101 slices shown, 7 images]
[im 11/101  soft-tissue]
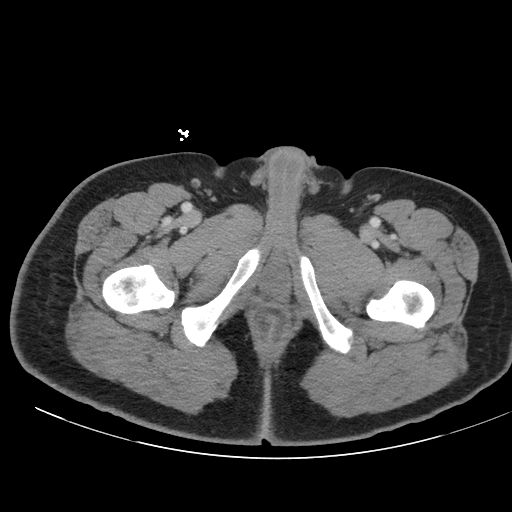
[im 11/101  bone]
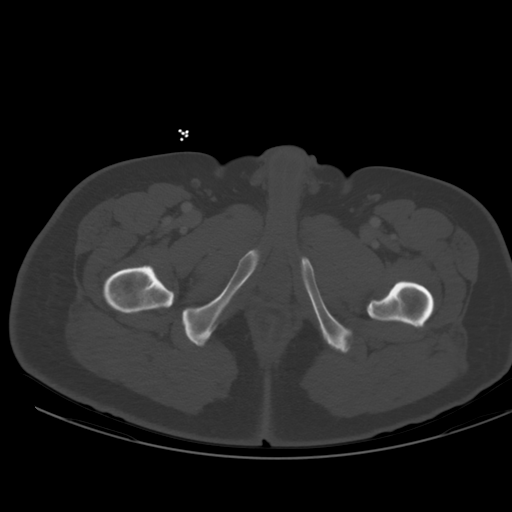
[im 27/101  soft-tissue]
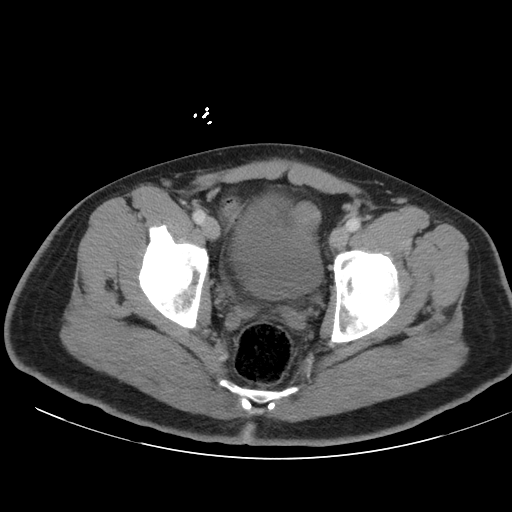
[im 43/101  soft-tissue]
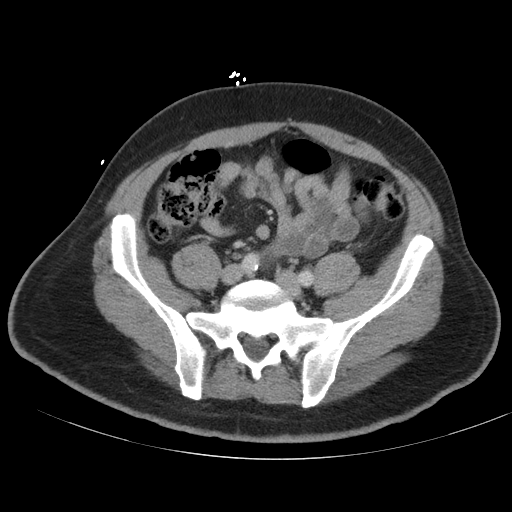
[im 58/101  soft-tissue]
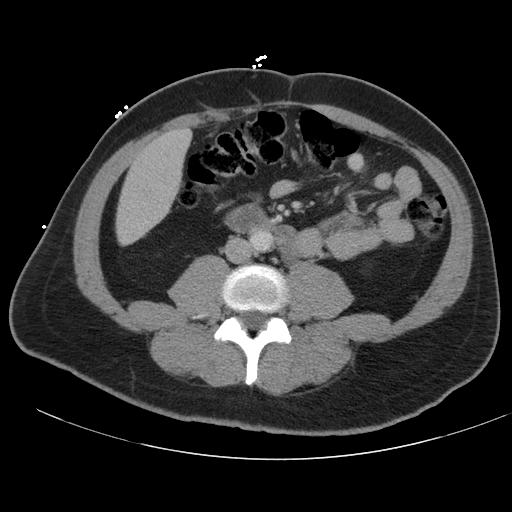
[im 74/101  soft-tissue]
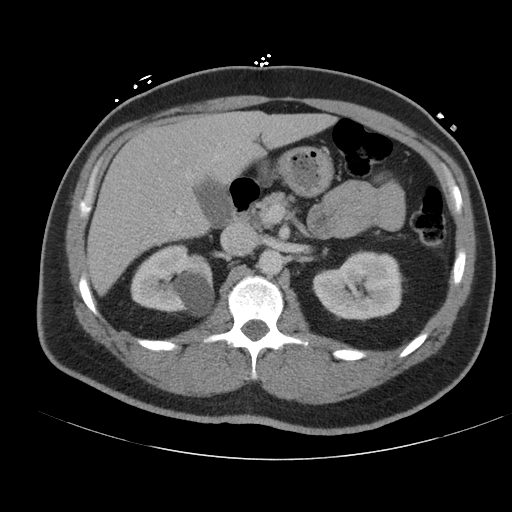
[im 90/101  soft-tissue]
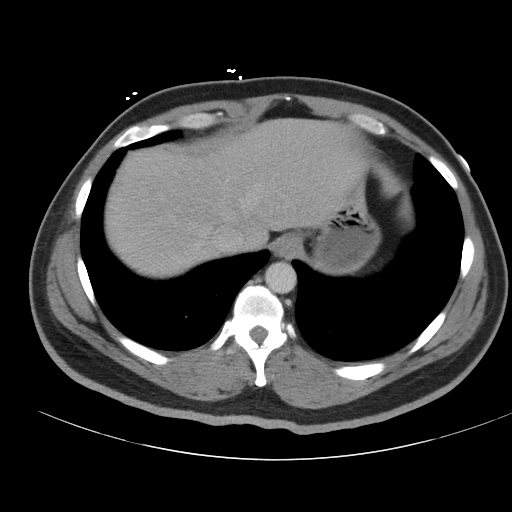

[Series 203: coronals, idose (2) · coronal · 0.45mm/px · 3 of 123 slices shown]
[im 41/123  soft-tissue]
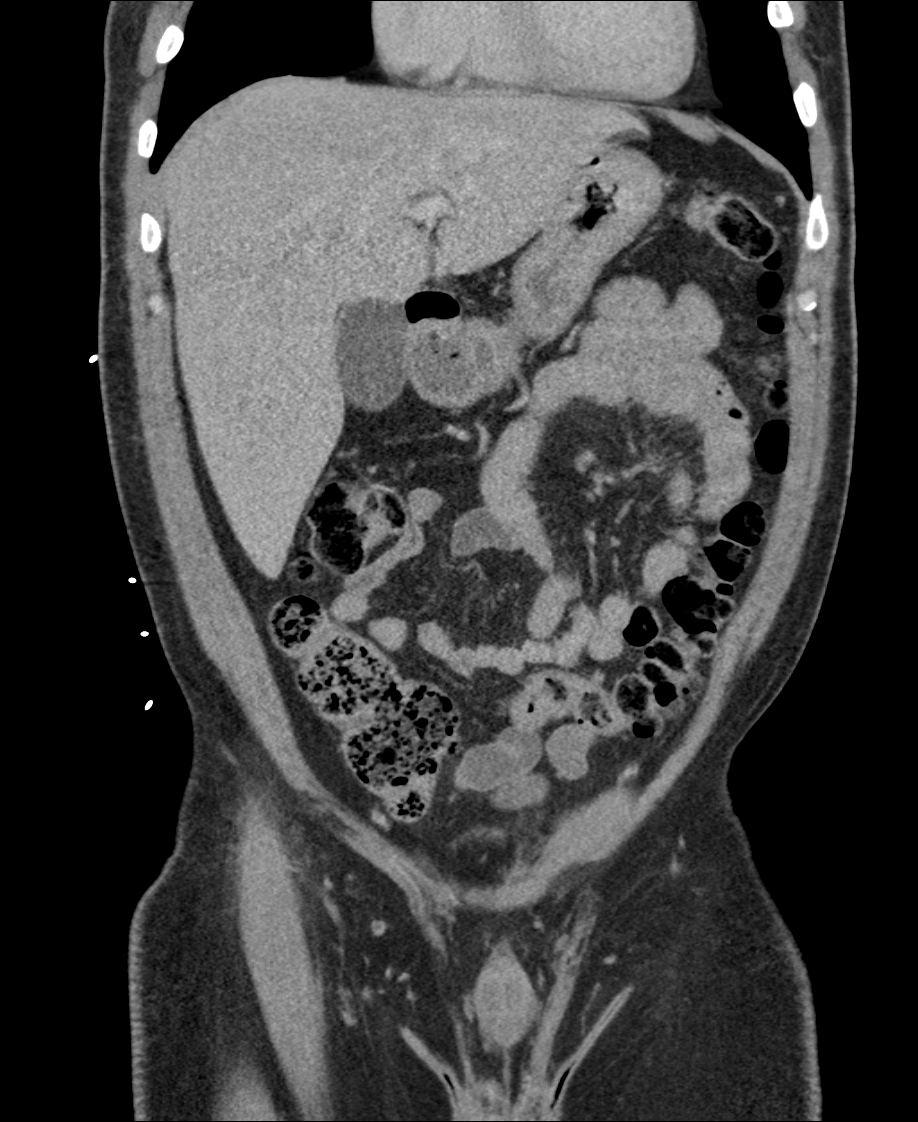
[im 55/123  soft-tissue]
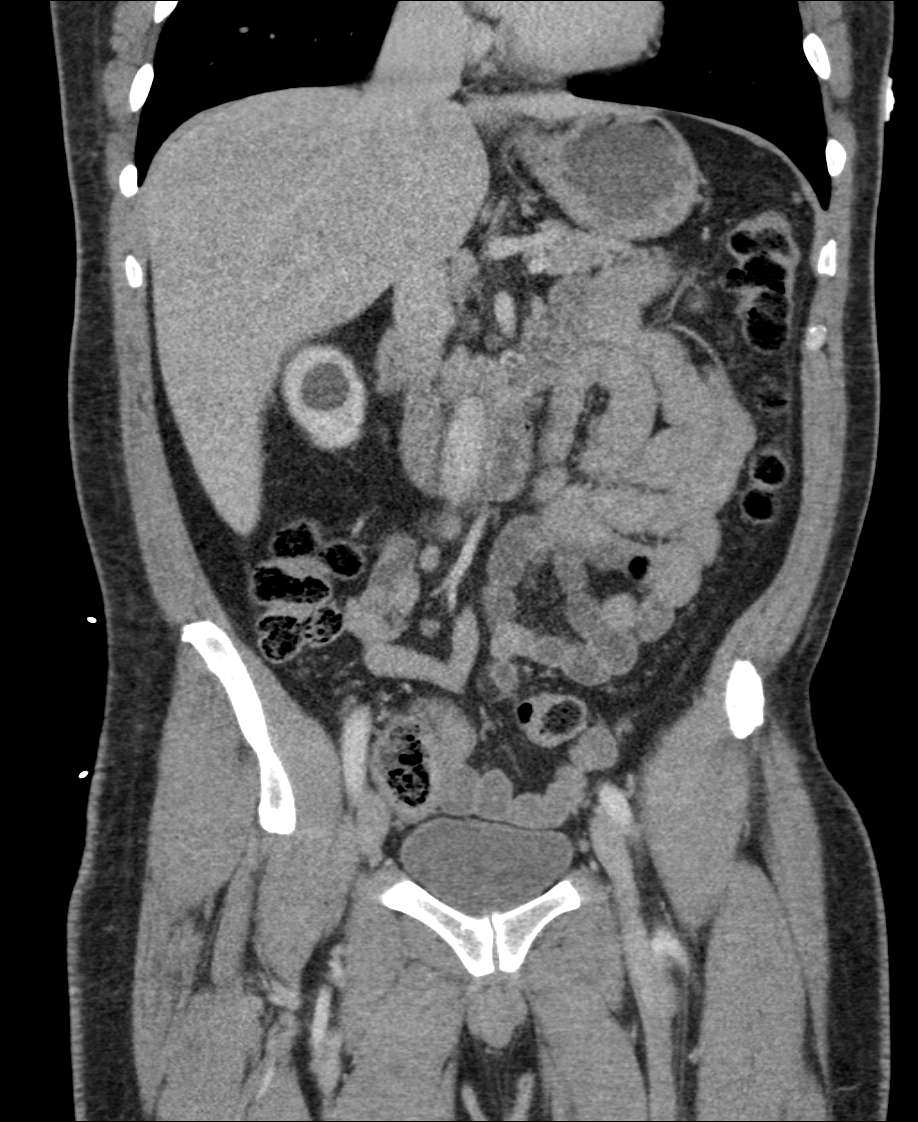
[im 68/123  soft-tissue]
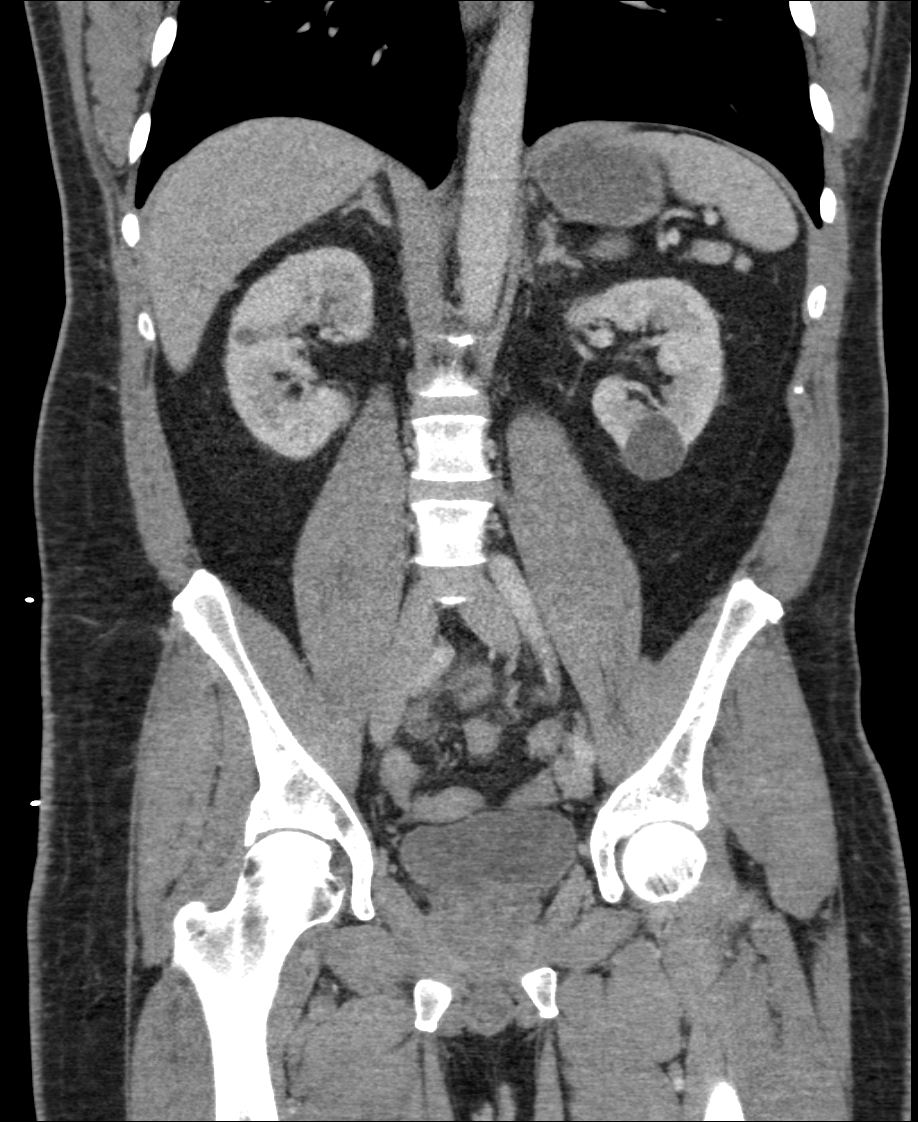

[9 of 46 positions shown; findings below may reference images not displayed]

FINDINGS: Lower chest: Unremarkable.

Hepatobiliary: No cystic or solid hepatic lesions. No intra or
extrahepatic biliary ductal dilatation. Gallbladder is normal in
appearance.

Pancreas: No pancreatic mass. No pancreatic ductal dilatation. No
pancreatic or peripancreatic fluid or inflammatory changes.

Spleen: Unremarkable.

Adrenals/Urinary Tract: There are several well-defined
low-attenuation lesions which do not enhance in the kidneys
bilaterally, compatible with simple cysts, measuring up to 3.6 cm in
the posterior aspect of the interpolar region of the right kidney.
Other sub cm low-attenuation lesions in the kidneys bilaterally too
small to definitively characterize, but are favored to represent
cysts. No hydroureteronephrosis. Urinary bladder is normal in
appearance. Right adrenal gland is normal in appearance. There are
small nodules in the left adrenal gland measuring up to 1.4 cm in
the medial limb, similar to prior study 06/24/2008; although these
are incompletely characterized on today's contrast enhanced
examination, these are statistically likely to represent small
adenomas.

Stomach/Bowel: The appearance of the stomach is normal. There is no
pathologic dilatation of small bowel or colon. The appendix is
dilated measuring up to 14 mm in diameter, and is surrounded by
inflammatory changes. The appendix is located in the central aspect
of the anatomic pelvis, best appreciated on axial image 61 of series
201 and coronal image 70 of series 203. Trace amount of adjacent
free fluid is presumably reactive. No overt signs to suggest frank
perforation are noted at this time.

Vascular/Lymphatic: Atherosclerosis in the pelvic vasculature. No
aneurysm or dissection identified in the abdominal or pelvic
vasculature. No lymphadenopathy noted in the abdomen or pelvis.

Reproductive: Prostate gland and seminal vesicles are unremarkable
in appearance.

Other: Trace volume of free fluid in the low anatomic pelvis,
presumably reactive to appendiceal inflammation. No
pneumoperitoneum.

Musculoskeletal: There are no aggressive appearing lytic or blastic
lesions noted in the visualized portions of the skeleton.
IMPRESSION: 1. 1. Findings are compatible with an acute appendicitis, as
discussed above. Small amount of adjacent reactive free fluid and
surrounding inflammatory changes are noted associated with the
inflamed appendix. No overt signs to suggest frank perforation are
noted at this time.
2. Additional incidental findings, as above.
Critical Value/emergent results were called by telephone at the time
of interpretation on 11/29/2015 at [DATE] to Dr. Yasuchika, who
verbally acknowledged these results.

## 2017-06-17 IMAGING — CR DG CHEST 2V
2 series · 2 of 2 positions shown · non-contrast
Comparison: 08/09/2014 chest radiograph.

CLINICAL DATA: 55 y/o  M; eft and abdomen pain.

EXAM:
CHEST  2 VIEW

[chest pa]
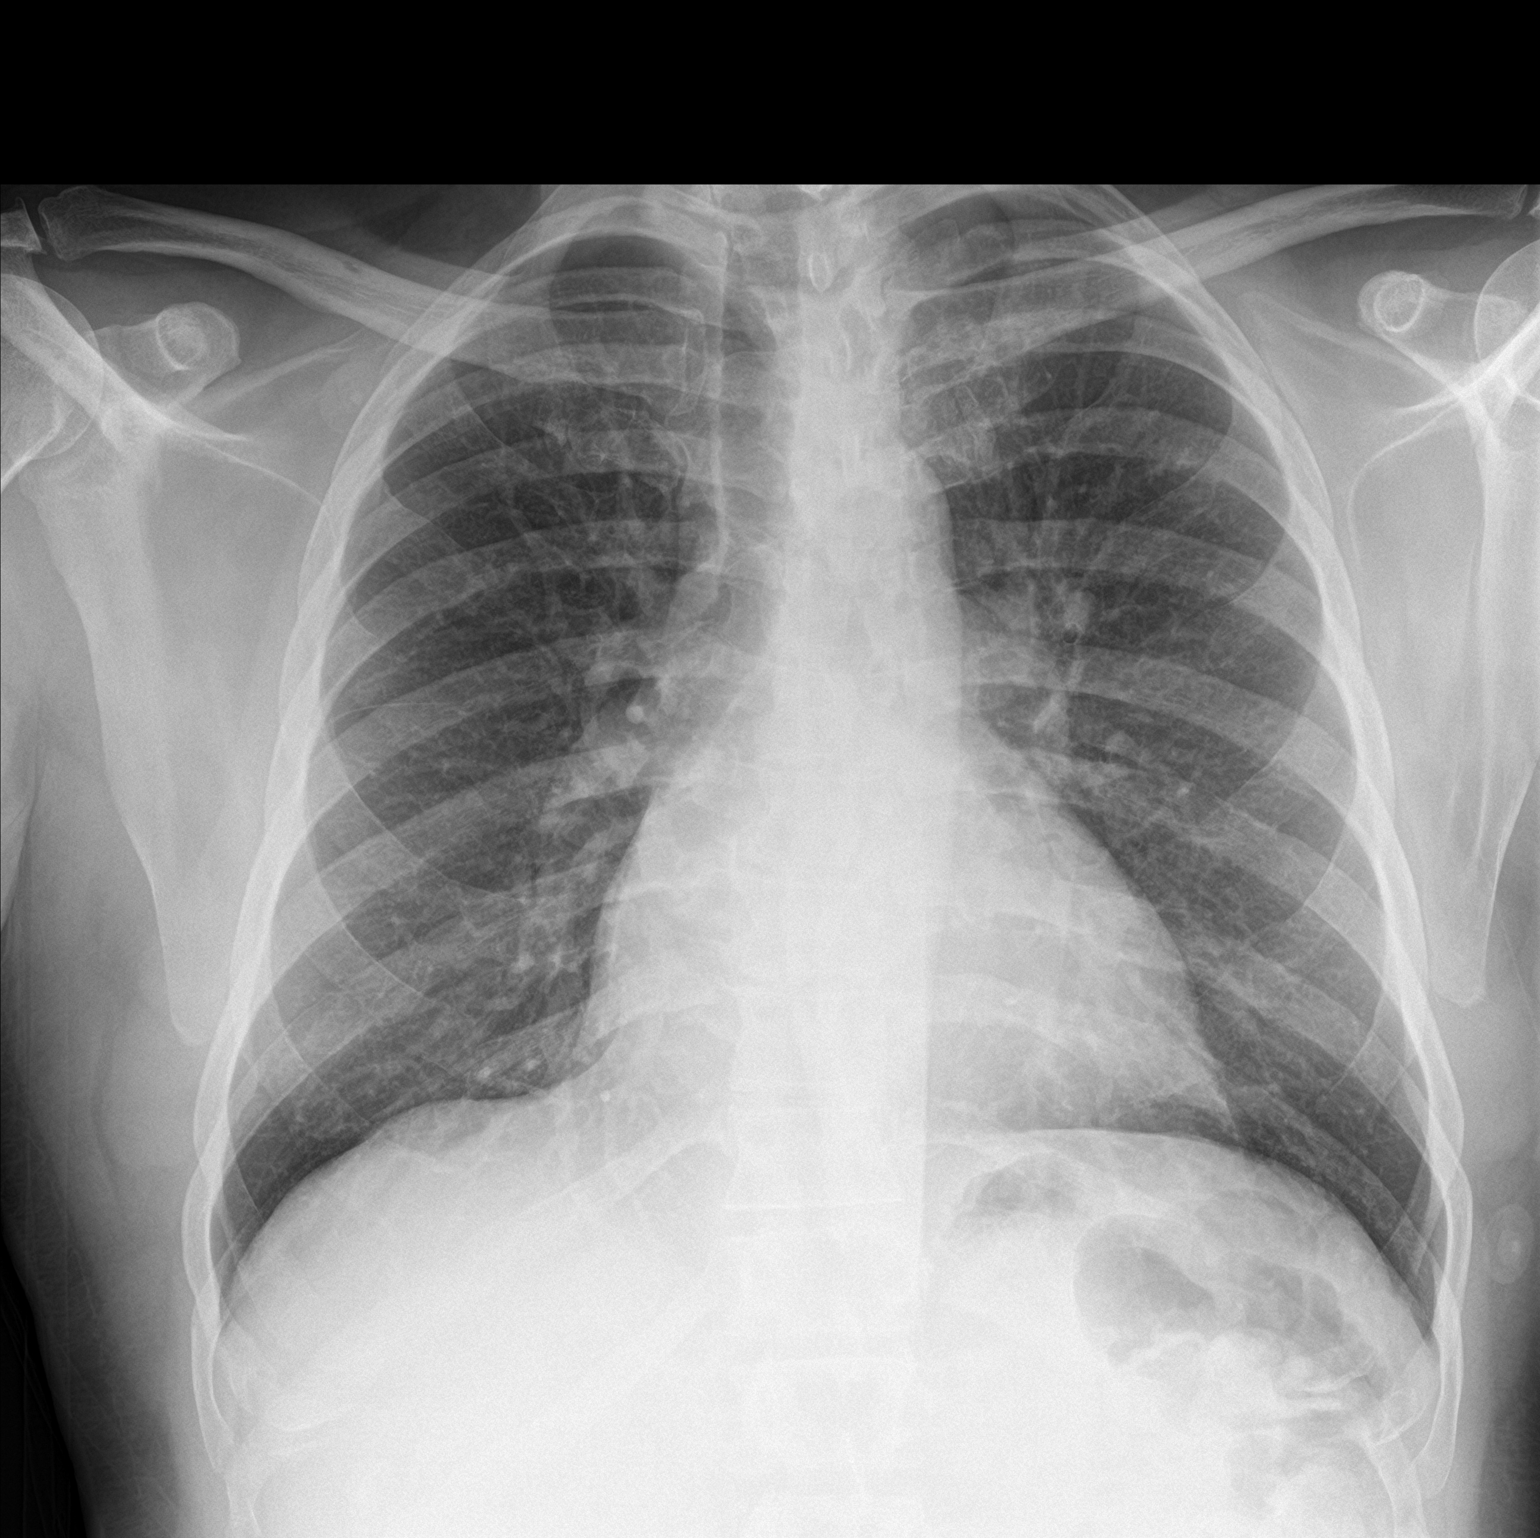

[chest lat]
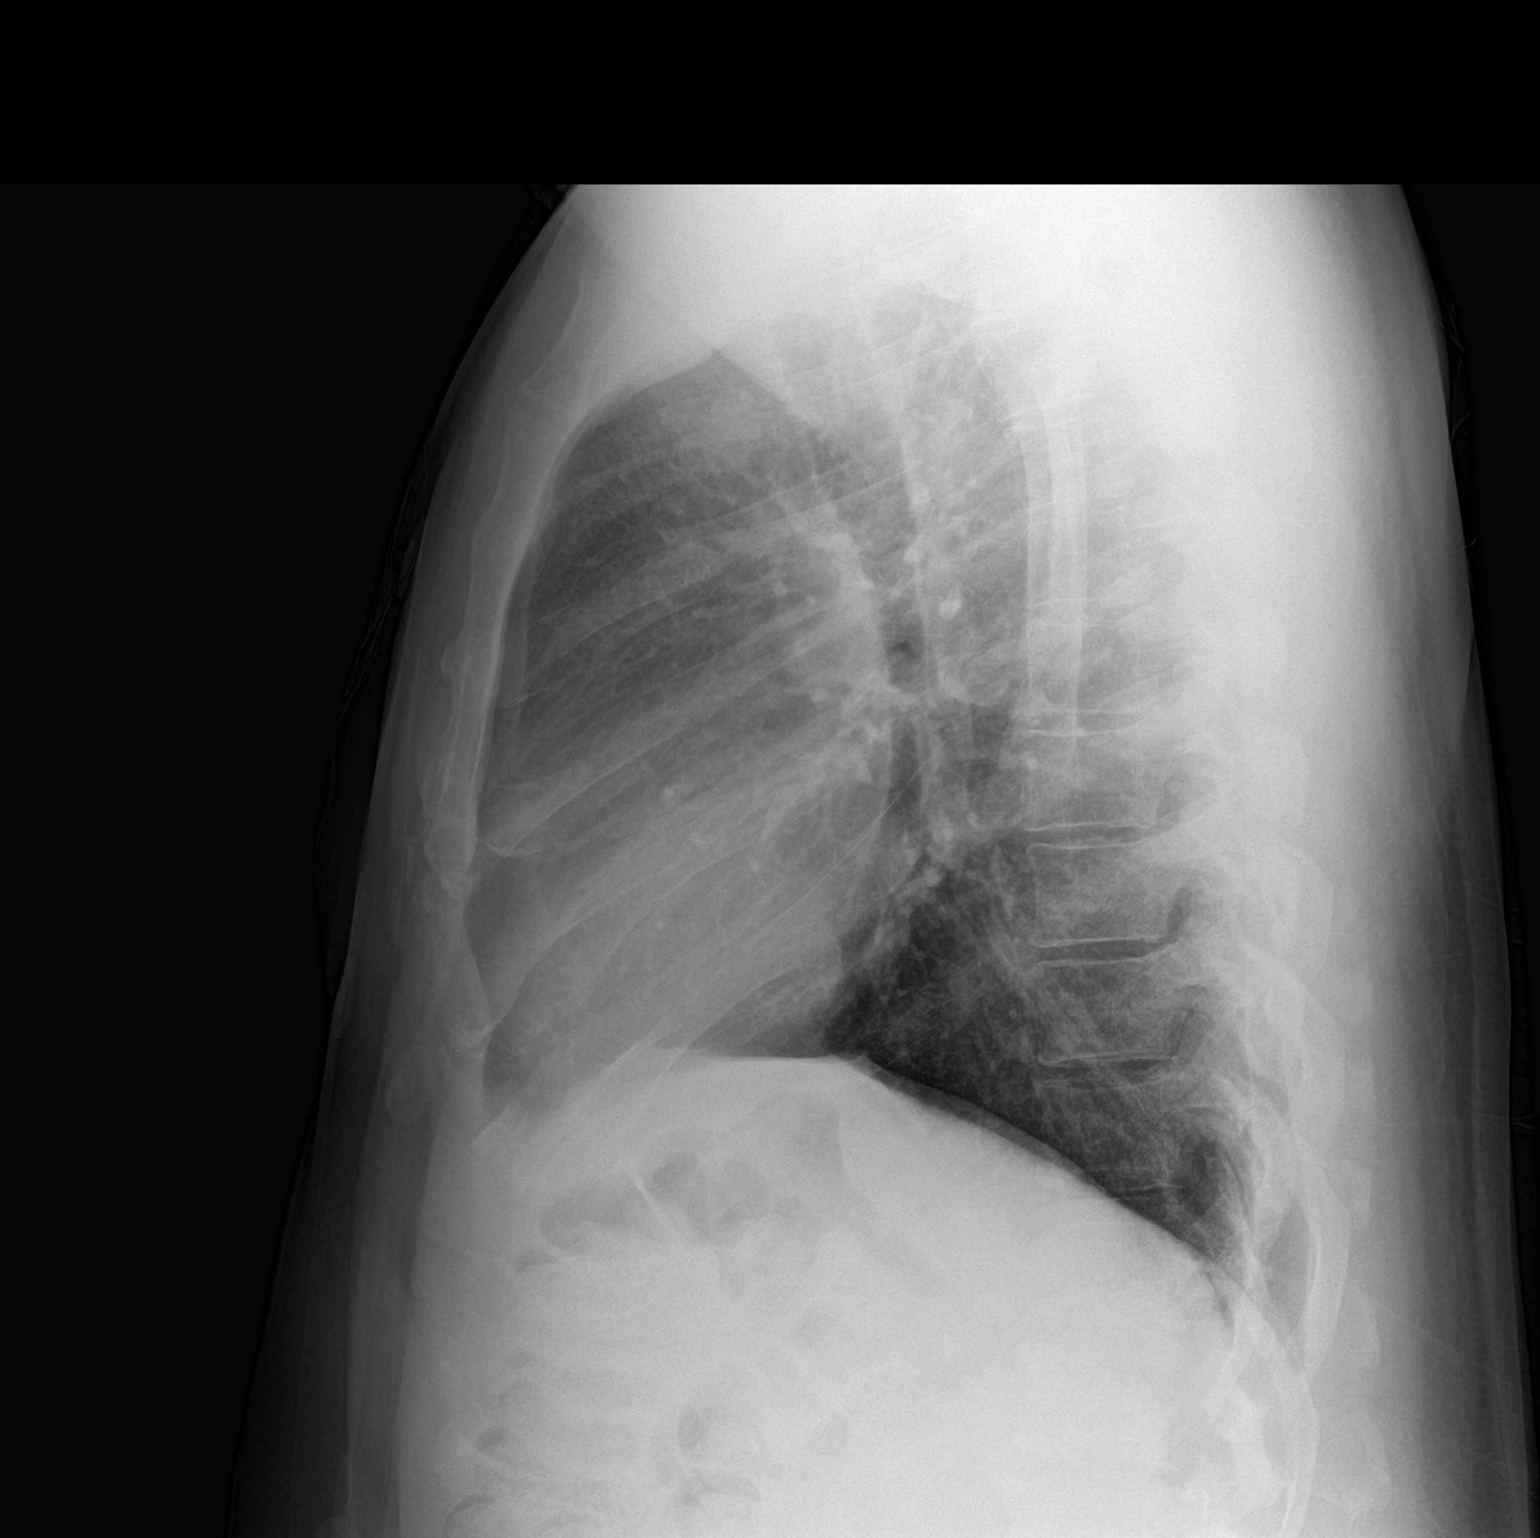

[2 of 2 positions shown; findings below may reference images not displayed]

FINDINGS: The heart size and mediastinal contours are within normal limits and
stable. Both lungs are clear. The visualized skeletal structures are
unremarkable.
IMPRESSION: No active cardiopulmonary disease.

By: Jejo Zin M.D.

## 2017-06-27 ENCOUNTER — Other Ambulatory Visit: Payer: Self-pay | Admitting: Family Medicine

## 2017-06-27 NOTE — Telephone Encounter (Signed)
He needs an appointment for refills

## 2017-06-30 NOTE — Telephone Encounter (Signed)
Patient has an appointment on 07-02-17 with pcp.  Jaclin Finks,CMA

## 2017-07-02 ENCOUNTER — Encounter: Payer: Self-pay | Admitting: Family Medicine

## 2017-07-02 ENCOUNTER — Ambulatory Visit (INDEPENDENT_AMBULATORY_CARE_PROVIDER_SITE_OTHER): Payer: Medicare Other | Admitting: Family Medicine

## 2017-07-02 ENCOUNTER — Other Ambulatory Visit: Payer: Self-pay

## 2017-07-02 VITALS — BP 136/72 | HR 49 | Temp 97.9°F | Ht 72.0 in | Wt 210.8 lb

## 2017-07-02 DIAGNOSIS — Z716 Tobacco abuse counseling: Secondary | ICD-10-CM | POA: Diagnosis not present

## 2017-07-02 DIAGNOSIS — Z0000 Encounter for general adult medical examination without abnormal findings: Secondary | ICD-10-CM | POA: Diagnosis not present

## 2017-07-02 DIAGNOSIS — I10 Essential (primary) hypertension: Secondary | ICD-10-CM

## 2017-07-02 MED ORDER — LISINOPRIL-HYDROCHLOROTHIAZIDE 20-25 MG PO TABS: 1.0000 | ORAL_TABLET | Freq: Every day | ORAL | 3 refills | Status: DC

## 2017-07-02 NOTE — Progress Notes (Signed)
    Subjective:    Patient ID: Stephen Lambert, male    DOB: February 23, 1960, 57 y.o.   MRN: [id number]   CC: physical exam  HPI: here for physical exam and to get med refills. He reports he is overall well. Last year he had a spell of near syncope while working outside in hot weather without eating or drinking fluids. He did not lose consciousness and this has not happened since.   HTN- bp well controlled on lisinopril-hctz. Patient denies any SOB, chest pain, DOE, LE edema, headaches, vision changes. He needs refills on his BP medication.  Social Hx-  Smoking 5-7 cigs/day. No alcohol since 2002, no drugs Disability due to fall off truck resulting back pain. Lives with wife.   He is due for several health maintenance items however he reports he has a strong aversion to needles and does not want to get blood drawn today, get tetanus shot, or be referred to GI for colonoscopy  Review of Systems- see HPI   Objective:  BP 136/72   Pulse (!) 49   Temp 97.9 F (36.6 C) (Oral)   Ht 6' (1.829 m)   Wt 210 lb 12.8 oz (95.6 kg)   SpO2 97%   BMI 28.59 kg/m  Vitals and nursing note reviewed  General: well nourished, in no acute distress HEENT: normocephalic, no scleral icterus or conjunctival pallor, no nasal discharge, moist mucous membranes, poor dentition without erythema or discharge noted in posterior oropharynx Neck: supple, non-tender, without lymphadenopathy Cardiac: RRR, clear S1 and S2, no murmurs, rubs, or gallops Respiratory: clear to auscultation bilaterally, no increased work of breathing Abdomen: soft, nontender, nondistended, no masses or organomegaly. Bowel sounds present Extremities: no edema or cyanosis. Skin: warm and dry, no rashes noted Neuro: alert and oriented, no focal deficits. Strength 5/5 in upper and lower extremities bilaterally. Normal gait.    Assessment & Plan:   1. Essential hypertension BP at goal <140/90 on zestoretic 20-25 mg. Medication refilled for  1 year. Discussed checking electrolytes and kidney function today, patient refused labs. He reports normal urine output.   2. Tobacco abuse counseling Patient interested in quitting smoking. He is working on cutting down with plans to quit cold [place] one day. Encouraged him for his efforts. Continue to encourage cessation.  3. Healthcare maintenance Due for HIV, Hep C, tetanus, colonoscopy. Patient refused these items today. Encouraged him to reconsider. Continue to address at future visits.    Return in about 1 year (around 07/03/2018), or as needed.   Dolores Patty, DO Family Medicine Resident PGY-2

## 2017-07-02 NOTE — Patient Instructions (Signed)
It was nice to meet you today.  Please consider getting your labs drawn. You are due for: HIV screening, Hepatitis C screening, lipid testing, kidney function test, electrolytes, and blood counts.  Please consider getting a colonoscopy. Call me if you change your mind to get this done.  If you have questions or concerns please do not hesitate to call at [phone].  Stephen Patty, DO PGY-2, [facility] 07/02/2017 1:56 PM    Coping with Quitting Smoking Quitting smoking is a physical and mental challenge. You will face cravings, withdrawal symptoms, and temptation. Before quitting, work with your health care provider to make a plan that can help you cope. Preparation can help you quit and keep you from giving in. How can I cope with cravings? Cravings usually last for 5-10 minutes. If you get through it, the craving will pass. Consider taking the following actions to help you cope with cravings:  Keep your mouth busy: ? Chew sugar-free gum. ? Suck on hard candies or a straw. ? Brush your teeth.  Keep your hands and body busy: ? Immediately change to a different activity when you feel a craving. ? Squeeze or play with a ball. ? Do an activity or a hobby, like making bead jewelry, practicing needlepoint, or working with wood. ? Mix up your normal routine. ? Take a short exercise break. Go for a quick walk or run up and down stairs. ? Spend time in public places where smoking is not allowed.  Focus on doing something kind or helpful for someone else.  Call a friend or family member to talk during a craving.  Join a support group.  Call a quit line, such as 1-800-QUIT-NOW.  Talk with your health care provider about medicines that might help you cope with cravings and make quitting easier for you.  How can I deal with withdrawal symptoms? Your body may experience negative effects as it tries to get used to not having nicotine in the system. These effects are  called withdrawal symptoms. They may include:  Feeling hungrier than normal.  Trouble concentrating.  Irritability.  Trouble sleeping.  Feeling depressed.  Restlessness and agitation.  Craving a cigarette.  To manage withdrawal symptoms:  Avoid places, people, and activities that trigger your cravings.  Remember why you want to quit.  Get plenty of sleep.  Avoid coffee and other caffeinated drinks. These may worsen some of your symptoms.  How can I handle social situations? Social situations can be difficult when you are quitting smoking, especially in the first few weeks. To manage this, you can:  Avoid parties, bars, and other social situations where people might be smoking.  Avoid alcohol.  Leave right away if you have the urge to smoke.  Explain to your family and friends that you are quitting smoking. Ask for understanding and support.  Plan activities with friends or family where smoking is not an option.  What are some ways I can cope with stress? Wanting to smoke may cause stress, and stress can make you want to smoke. Find ways to manage your stress. Relaxation techniques can help. For example:  Breathe slowly and deeply, in through your nose and out through your mouth.  Listen to soothing, relaxing music.  Talk with a family member or friend about your stress.  Light a candle.  Soak in a bath or take a shower.  Think about a peaceful place.  What are some ways I can prevent weight gain? Be  aware that many people gain weight after they quit smoking. However, not everyone does. To keep from gaining weight, have a plan in place before you quit and stick to the plan after you quit. Your plan should include:  Having healthy snacks. When you have a craving, it may help to: ? Eat plain popcorn, crunchy carrots, celery, or other cut vegetables. ? Chew sugar-free gum.  Changing how you eat: ? Eat small portion sizes at meals. ? Eat 4-6 small meals  throughout the day instead of 1-2 large meals a day. ? Be mindful when you eat. Do not watch television or do other things that might distract you as you eat.  Exercising regularly: ? Make time to exercise each day. If you do not have time for a long workout, do short bouts of exercise for 5-10 minutes several times a day. ? Do some form of strengthening exercise, like weight lifting, and some form of aerobic exercise, like running or swimming.  Drinking plenty of water or other low-calorie or no-calorie drinks. Drink 6-8 glasses of water daily, or as much as instructed by your health care provider.  Summary  Quitting smoking is a physical and mental challenge. You will face cravings, withdrawal symptoms, and temptation to smoke again. Preparation can help you as you go through these challenges.  You can cope with cravings by keeping your mouth busy (such as by chewing gum), keeping your body and hands busy, and making calls to family, friends, or a helpline for people who want to quit smoking.  You can cope with withdrawal symptoms by avoiding places where people smoke, avoiding drinks with caffeine, and getting plenty of rest.  Ask your health care provider about the different ways to prevent weight gain, avoid stress, and handle social situations. This information is not intended to replace advice given to you by your health care provider. Make sure you discuss any questions you have with your health care provider. Document Released: 02/02/2016 Document Revised: 02/02/2016 Document Reviewed: 02/02/2016 Elsevier Interactive Patient Education  [employer].

## 2018-06-17 ENCOUNTER — Other Ambulatory Visit: Payer: Self-pay | Admitting: Family Medicine

## 2019-07-16 ENCOUNTER — Other Ambulatory Visit: Payer: Self-pay

## 2019-07-20 MED ORDER — LISINOPRIL-HYDROCHLOROTHIAZIDE 20-25 MG PO TABS: 1.0000 | ORAL_TABLET | Freq: Every day | ORAL | 0 refills | Status: DC

## 2019-07-20 NOTE — Telephone Encounter (Signed)
Patient called and informed that 30 day refill was sent to pharmacy. Advised patient that follow up appt would be needed before additional refills could be sent in. Patient states that he will call back once he receives his work schedule.   To PCP  Veronda Prude, RN

## 2019-07-20 NOTE — Telephone Encounter (Signed)
Patient has not been seen in [facility] office since 06/2017.  30-d Rx refill sent.  Please have patient schedule an appointment for future refills.

## 2019-08-11 ENCOUNTER — Other Ambulatory Visit: Payer: Self-pay | Admitting: Family Medicine

## 2019-08-11 DIAGNOSIS — I10 Essential (primary) hypertension: Secondary | ICD-10-CM

## 2019-08-18 NOTE — Telephone Encounter (Signed)
Patient calls nurse line requesting partial refill of BP medication to hold him until appointment on 08/27/19. Patient reports that he only has one pill left.   To PCP  Veronda Prude, RN

## 2019-08-19 MED ORDER — LISINOPRIL-HYDROCHLOROTHIAZIDE 20-25 MG PO TABS: 1.0000 | ORAL_TABLET | Freq: Every day | ORAL | 0 refills | Status: DC

## 2019-08-19 NOTE — Addendum Note (Signed)
Addended by: Katha Cabal D on: 08/19/2019 11:19 PM   Modules accepted: Orders

## 2019-08-19 NOTE — Telephone Encounter (Signed)
Patient has made multiple requests for his antihypertensive medications though he has not been seen in the clinic since May 2019.  I have refilled his antihypertensive medication on at least 3 other instances.  Patient must make and keep an appointment prior to next refill of this medication.  It is unsafe to continue to prescribe antihypertensive medications without blood pressure/renal monitoring and no office visit in 2 years.

## 2019-08-27 ENCOUNTER — Ambulatory Visit: Payer: Medicare Other | Admitting: Family Medicine

## 2019-09-10 ENCOUNTER — Encounter: Payer: Self-pay | Admitting: Family Medicine

## 2019-09-10 ENCOUNTER — Other Ambulatory Visit: Payer: Self-pay

## 2019-09-10 ENCOUNTER — Ambulatory Visit (INDEPENDENT_AMBULATORY_CARE_PROVIDER_SITE_OTHER): Payer: Medicare Other | Admitting: Family Medicine

## 2019-09-10 VITALS — BP 130/60 | HR 60 | Ht 73.0 in | Wt 224.6 lb

## 2019-09-10 DIAGNOSIS — Z716 Tobacco abuse counseling: Secondary | ICD-10-CM

## 2019-09-10 DIAGNOSIS — F17210 Nicotine dependence, cigarettes, uncomplicated: Secondary | ICD-10-CM | POA: Diagnosis not present

## 2019-09-10 DIAGNOSIS — E663 Overweight: Secondary | ICD-10-CM | POA: Diagnosis not present

## 2019-09-10 DIAGNOSIS — Z114 Encounter for screening for human immunodeficiency virus [HIV]: Secondary | ICD-10-CM | POA: Diagnosis not present

## 2019-09-10 DIAGNOSIS — R7309 Other abnormal glucose: Secondary | ICD-10-CM | POA: Diagnosis not present

## 2019-09-10 DIAGNOSIS — I10 Essential (primary) hypertension: Secondary | ICD-10-CM | POA: Diagnosis not present

## 2019-09-10 DIAGNOSIS — Z833 Family history of diabetes mellitus: Secondary | ICD-10-CM | POA: Diagnosis not present

## 2019-09-10 LAB — POCT GLYCOSYLATED HEMOGLOBIN (HGB A1C): Hemoglobin A1C: 5.6 % (ref 4.0–5.6)

## 2019-09-10 MED ORDER — NICOTINE 7 MG/24HR TD PT24: 7.0000 mg | MEDICATED_PATCH | Freq: Every day | TRANSDERMAL | 0 refills | Status: DC

## 2019-09-10 MED ORDER — LISINOPRIL-HYDROCHLOROTHIAZIDE 20-25 MG PO TABS: 1.0000 | ORAL_TABLET | Freq: Every day | ORAL | 0 refills | Status: DC

## 2019-09-10 NOTE — Assessment & Plan Note (Signed)
Smokes 1/4 ppd.  Nicotine patches prescribed. Smoking cessation instruction/counseling given:  counseled patient on the dangers of tobacco use, advised patient to stop smoking, and reviewed strategies to maximize success.

## 2019-09-10 NOTE — Patient Instructions (Signed)
It was great seeing you today!  Please check-out at the front desk before leaving the clinic. I'd like to see you back in 1 year but if you need to be seen earlier than that for any new issues we're happy to fit you in, just give [place] a call!  Visit Remembers: - Stop by the pharmacy to pick up your prescriptions  - Continue to work on your healthy eating habits and incorporating exercise into your daily life.  - Your goal is to have an BP <130/80  Regarding lab work today:  Due to recent changes in healthcare laws, you may see the results of your imaging and laboratory studies on MyChart before your provider has had a chance to review them.  I understand that in some cases there may be results that are confusing or concerning to you. Not all laboratory results come back in the same time frame and you may be waiting for multiple results in order to interpret others.  Please give [place] 72 hours in order for your provider to thoroughly review all the results before contacting the office for clarification of your results. If everything is normal, you will get a letter in the mail or a message in My Chart. Please give [place] a call if you do not hear from [place] after 2 weeks.  Please bring all of your medications with you to each visit.    If you haven't already, sign up for My Chart to have easy access to your labs results, and communication with your primary care physician.  Feel free to call with any questions or concerns at any time, at [phone].   Take care,  Dr. Katherina [facility]

## 2019-09-10 NOTE — Assessment & Plan Note (Addendum)
HTN plan: Stable. BP at goal. Continue current medications.  Medications: Lisinopril-HCTZ  Cardiovascular risk factors: advanced age (older than 77 for men, 31 for women), hypertension, male gender and smoking/ tobacco exposure.  History of target organ damage: none. - CMP, a1c, lipid panel today  Preventitive Healthcare: Exercise: no, encouraged  Diet Pattern: baked foods, limited processed foods Salt Restriction: yes

## 2019-09-10 NOTE — Progress Notes (Signed)
   SUBJECTIVE:   CHIEF COMPLAINT / HPI:   Chief Complaint  Patient presents with  . Medication Refill     Stephen Lambert is a 59 y.o. male here for follow up and needs medication refills.    Hypertension Patient here for follow-up of  hypertension. Blood pressure is well controlled at home. He is not exercising and is adherent to a low-salt diet. She currently takes Lisinopril/HCTZ and is adherent to regimen.  Cardiac symptoms: none. Patient denies: chest pain, chest pressure/discomfort, dyspnea, exertional chest pressure/discomfort, fatigue, lower extremity edema, orthopnea, palpitations, paroxysmal nocturnal dyspnea and syncope.  Use of agents associated with hypertension: none.    Tobacco use   Smokes 5-6 cigarettes to smoke.  Would like to quit smoking.     PERTINENT  PMH / PSH: HTN, FM hx of diabetes, tobacco use, hx of abnormal glucose, reviewed and updated as appropriate   OBJECTIVE:   BP (!) 130/60   Pulse 60   Ht 6\' 1"  (1.854 m)   Wt (!) 224 lb 9.6 oz (101.9 kg)   SpO2 98%   BMI 29.63 kg/m   GEN: pleasant male, in no acute distress  CV: regular rate and rhythm, no murmurs appreciated  RESP: no increased work of breathing, clear to ascultation  ABD: Bowel sounds present. Soft, Nontender, Nondistended. MSK: no LE edema, or calf tenderness, uses cane SKIN: warm, dry, normal skin turgor NEURO: strength 4+/5 LLE, 5/5 RLE, 5/5 bilateral grip strength, moves all extremities appropriately PSYCH: Normal affect, appropriate speech and behavior    ASSESSMENT/PLAN:   HTN (hypertension) HTN plan: Stable. BP at goal. Continue current medications.  Medications: Lisinopril-HCTZ  Cardiovascular risk factors: advanced age (older than 25 for men, 9 for women), hypertension, male gender and smoking/ tobacco exposure.  History of target organ damage: none. - CMP, a1c, lipid panel today  Preventitive Healthcare: Exercise: no, encouraged  Diet Pattern: baked foods,  limited processed foods Salt Restriction: yes   Tobacco abuse counseling Smokes 1/4 ppd.  Nicotine patches prescribed. Smoking cessation instruction/counseling given:  counseled patient on the dangers of tobacco use, advised patient to stop smoking, and reviewed strategies to maximize success.        76, DO PGY-2, [facility] 09/10/2019

## 2019-09-11 ENCOUNTER — Other Ambulatory Visit: Payer: Self-pay | Admitting: Family Medicine

## 2019-09-11 DIAGNOSIS — I10 Essential (primary) hypertension: Secondary | ICD-10-CM

## 2019-09-11 LAB — COMPREHENSIVE METABOLIC PANEL
ALT: 13 IU/L (ref 0–44)
AST: 13 IU/L (ref 0–40)
Albumin/Globulin Ratio: 1.9 (ref 1.2–2.2)
Albumin: 4.6 g/dL (ref 3.8–4.9)
Alkaline Phosphatase: 43 IU/L — ABNORMAL LOW (ref 48–121)
BUN/Creatinine Ratio: 11 (ref 9–20)
BUN: 14 mg/dL (ref 6–24)
Bilirubin Total: 0.3 mg/dL (ref 0.0–1.2)
CO2: 25 mmol/L (ref 20–29)
Calcium: 9.4 mg/dL (ref 8.7–10.2)
Chloride: 104 mmol/L (ref 96–106)
Creatinine, Ser: 1.25 mg/dL (ref 0.76–1.27)
GFR calc Af Amer: 72 mL/min/{1.73_m2} (ref >59)
GFR calc non Af Amer: 63 mL/min/{1.73_m2} (ref >59)
Globulin, Total: 2.4 g/dL (ref 1.5–4.5)
Glucose: 89 mg/dL (ref 65–99)
Potassium: 4.1 mmol/L (ref 3.5–5.2)
Sodium: 143 mmol/L (ref 134–144)
Total Protein: 7 g/dL (ref 6.0–8.5)

## 2019-09-11 LAB — LIPID PANEL
Chol/HDL Ratio: 3 ratio (ref 0.0–5.0)
Cholesterol, Total: 226 mg/dL — ABNORMAL HIGH (ref 100–199)
HDL: 76 mg/dL (ref >39)
LDL Chol Calc (NIH): 136 mg/dL — ABNORMAL HIGH (ref 0–99)
Triglycerides: 81 mg/dL (ref 0–149)
VLDL Cholesterol Cal: 14 mg/dL (ref 5–40)

## 2019-09-11 LAB — HIV ANTIBODY (ROUTINE TESTING W REFLEX): HIV Screen 4th Generation wRfx: NONREACTIVE

## 2019-09-14 ENCOUNTER — Encounter: Payer: Self-pay | Admitting: Family Medicine

## 2019-10-12 ENCOUNTER — Other Ambulatory Visit: Payer: Self-pay | Admitting: Family Medicine

## 2019-10-12 DIAGNOSIS — I10 Essential (primary) hypertension: Secondary | ICD-10-CM

## 2019-10-13 ENCOUNTER — Other Ambulatory Visit: Payer: Self-pay | Admitting: Family Medicine

## 2019-10-13 DIAGNOSIS — I10 Essential (primary) hypertension: Secondary | ICD-10-CM

## 2019-11-06 ENCOUNTER — Other Ambulatory Visit: Payer: Self-pay | Admitting: Family Medicine

## 2019-11-06 DIAGNOSIS — I10 Essential (primary) hypertension: Secondary | ICD-10-CM

## 2019-12-09 ENCOUNTER — Other Ambulatory Visit: Payer: Self-pay | Admitting: Family Medicine

## 2019-12-09 DIAGNOSIS — I10 Essential (primary) hypertension: Secondary | ICD-10-CM

## 2020-01-07 ENCOUNTER — Other Ambulatory Visit: Payer: Self-pay | Admitting: Family Medicine

## 2020-01-07 DIAGNOSIS — I10 Essential (primary) hypertension: Secondary | ICD-10-CM

## 2020-02-11 ENCOUNTER — Other Ambulatory Visit: Payer: Self-pay | Admitting: Family Medicine

## 2020-02-11 DIAGNOSIS — I10 Essential (primary) hypertension: Secondary | ICD-10-CM

## 2020-03-08 ENCOUNTER — Other Ambulatory Visit: Payer: Self-pay | Admitting: Family Medicine

## 2020-03-08 DIAGNOSIS — I10 Essential (primary) hypertension: Secondary | ICD-10-CM

## 2020-06-05 ENCOUNTER — Other Ambulatory Visit: Payer: Self-pay | Admitting: Family Medicine

## 2020-06-05 DIAGNOSIS — I10 Essential (primary) hypertension: Secondary | ICD-10-CM

## 2020-12-01 ENCOUNTER — Other Ambulatory Visit: Payer: Self-pay | Admitting: Family Medicine

## 2020-12-01 DIAGNOSIS — I10 Essential (primary) hypertension: Secondary | ICD-10-CM

## 2021-05-22 ENCOUNTER — Other Ambulatory Visit: Payer: Self-pay | Admitting: Family Medicine

## 2021-05-22 DIAGNOSIS — I10 Essential (primary) hypertension: Secondary | ICD-10-CM

## 2021-06-14 ENCOUNTER — Other Ambulatory Visit: Payer: Self-pay

## 2021-06-14 DIAGNOSIS — I10 Essential (primary) hypertension: Secondary | ICD-10-CM

## 2021-06-15 MED ORDER — LISINOPRIL-HYDROCHLOROTHIAZIDE 20-25 MG PO TABS: 1.0000 | ORAL_TABLET | Freq: Every day | ORAL | 0 refills | Status: DC

## 2021-07-24 ENCOUNTER — Encounter: Payer: Self-pay | Admitting: *Deleted

## 2022-08-22 ENCOUNTER — Emergency Department (HOSPITAL_COMMUNITY)
Admission: EM | Admit: 2022-08-22 | Discharge: 2022-08-22 | Disposition: A | Discharge status: Home / Self Care | Payer: Medicare Other | Attending: Emergency Medicine | Admitting: Emergency Medicine

## 2022-08-22 ENCOUNTER — Other Ambulatory Visit: Payer: Self-pay

## 2022-08-22 DIAGNOSIS — Z79899 Other long term (current) drug therapy: Secondary | ICD-10-CM | POA: Diagnosis not present

## 2022-08-22 DIAGNOSIS — R000 Tachycardia, unspecified: Secondary | ICD-10-CM | POA: Insufficient documentation

## 2022-08-22 DIAGNOSIS — F1123 Opioid dependence with withdrawal: Secondary | ICD-10-CM | POA: Insufficient documentation

## 2022-08-22 DIAGNOSIS — I10 Essential (primary) hypertension: Secondary | ICD-10-CM | POA: Diagnosis not present

## 2022-08-22 DIAGNOSIS — F1193 Opioid use, unspecified with withdrawal: Secondary | ICD-10-CM

## 2022-08-22 LAB — CBC WITH DIFFERENTIAL/PLATELET
Abs Immature Granulocytes: 0.01 10*3/uL (ref 0.00–0.07)
Basophils Absolute: 0 10*3/uL (ref 0.0–0.1)
Basophils Relative: 1 %
Eosinophils Absolute: 0.1 10*3/uL (ref 0.0–0.5)
Eosinophils Relative: 1 %
HCT: 41.4 % (ref 39.0–52.0)
Hemoglobin: 13.4 g/dL (ref 13.0–17.0)
Immature Granulocytes: 0 %
Lymphocytes Relative: 28 %
Lymphs Abs: 1.2 10*3/uL (ref 0.7–4.0)
MCH: 26 pg (ref 26.0–34.0)
MCHC: 32.4 g/dL (ref 30.0–36.0)
MCV: 80.2 fL (ref 80.0–100.0)
Monocytes Absolute: 0.3 10*3/uL (ref 0.1–1.0)
Monocytes Relative: 6 %
Neutro Abs: 2.6 10*3/uL (ref 1.7–7.7)
Neutrophils Relative %: 64 %
Platelets: 39 10*3/uL — ABNORMAL LOW (ref 150–400)
RBC: 5.16 MIL/uL (ref 4.22–5.81)
RDW: 13.4 % (ref 11.5–15.5)
WBC: 4.1 10*3/uL (ref 4.0–10.5)
nRBC: 0 % (ref 0.0–0.2)

## 2022-08-22 LAB — COMPREHENSIVE METABOLIC PANEL
ALT: 13 U/L (ref 0–44)
AST: 24 U/L (ref 15–41)
Albumin: 4.3 g/dL (ref 3.5–5.0)
Alkaline Phosphatase: 37 U/L — ABNORMAL LOW (ref 38–126)
Anion gap: 16 — ABNORMAL HIGH (ref 5–15)
BUN: 12 mg/dL (ref 8–23)
CO2: 26 mmol/L (ref 22–32)
Calcium: 9.7 mg/dL (ref 8.9–10.3)
Chloride: 99 mmol/L (ref 98–111)
Creatinine, Ser: 1.38 mg/dL — ABNORMAL HIGH (ref 0.61–1.24)
GFR, Estimated: 58 mL/min — ABNORMAL LOW (ref >60)
Glucose, Bld: 112 mg/dL — ABNORMAL HIGH (ref 70–99)
Potassium: 4.3 mmol/L (ref 3.5–5.1)
Sodium: 141 mmol/L (ref 135–145)
Total Bilirubin: 0.8 mg/dL (ref 0.3–1.2)
Total Protein: 7.1 g/dL (ref 6.5–8.1)

## 2022-08-22 MED ORDER — ONDANSETRON 4 MG PO TBDP: 4.0000 mg | ORAL_TABLET | Freq: Three times a day (TID) | ORAL | 0 refills | Status: DC | PRN

## 2022-08-22 MED ORDER — LORAZEPAM 1 MG PO TABS
1.0000 mg | ORAL_TABLET | Freq: Once | ORAL | Status: AC
  Administered 2022-08-22: 1 mg via ORAL
  Filled 2022-08-22: qty 1

## 2022-08-22 MED ORDER — ONDANSETRON 4 MG PO TBDP
4.0000 mg | ORAL_TABLET | Freq: Once | ORAL | Status: AC
  Administered 2022-08-22: 4 mg via ORAL
  Filled 2022-08-22: qty 1

## 2022-08-22 MED ORDER — LISINOPRIL-HYDROCHLOROTHIAZIDE 20-25 MG PO TABS: 1.0000 | ORAL_TABLET | Freq: Every day | ORAL | 2 refills | Status: DC

## 2022-08-22 MED ORDER — CLONIDINE HCL 0.1 MG PO TABS: 0.1000 mg | ORAL_TABLET | Freq: Two times a day (BID) | ORAL | 0 refills | Status: DC

## 2022-08-22 MED ORDER — CLONIDINE HCL 0.1 MG PO TABS
0.1000 mg | ORAL_TABLET | Freq: Once | ORAL | Status: AC
  Administered 2022-08-22: 0.1 mg via ORAL
  Filled 2022-08-22: qty 1

## 2022-08-22 NOTE — ED Triage Notes (Signed)
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 Last dose sometime yesterday, does not remember time. EMS BP 280/100. Seeking help with withdrawal

## 2022-08-22 NOTE — ED Provider Notes (Signed)
[place] EMERGENCY DEPARTMENT AT [facility] Provider Note   CSN: [id number] Arrival date & time: 08/22/22  3244     History  Chief Complaint  Patient presents with   Hypertension    Stephen Lambert is a 62 y.o. male.  HPI     This is a 62 year old male with a history of hypertension who presents with concern for withdrawal syndrome.  Patient reports that he frequently uses fentanyl.  Last use was yesterday.  He states that he has been experiencing withdrawal symptoms including shaking and nausea.  He is he denies any alcohol use.  He has never been in detox or had "withdrawals this bad before."  Denies any diarrhea at this time.  He states "I just feel bad."  Home Medications Prior to Admission medications   Medication Sig Start Date End Date Taking? Authorizing Provider  cloNIDine (CATAPRES) 0.1 MG tablet Take 1 tablet (0.1 mg total) by mouth 2 (two) times daily. 08/22/22  Yes Lorilee Cafarella, Mayer Masker, MD  ondansetron (ZOFRAN-ODT) 4 MG disintegrating tablet Take 1 tablet (4 mg total) by mouth every 8 (eight) hours as needed for nausea or vomiting. 08/22/22  Yes Zerick Prevette, Mayer Masker, MD  lisinopril-hydrochlorothiazide (ZESTORETIC) 20-25 MG tablet Take 1 tablet by mouth daily. Schedule an appointment prior to next refill request. 08/22/22   Zerline Melchior, Mayer Masker, MD  nicotine (NICODERM CQ - DOSED IN MG/24 HR) 7 mg/24hr patch Place 1 patch (7 mg total) onto the skin daily. 09/10/19   Katha Cabal, DO      Allergies    Patient has no known allergies.    Review of Systems   Review of Systems  Constitutional:  Negative for fever.  Respiratory:  Negative for shortness of breath.   Cardiovascular:  Negative for chest pain.  Gastrointestinal:  Positive for nausea. Negative for abdominal pain.  All other systems reviewed and are negative.   Physical Exam Updated Vital Signs BP (!) 193/98   Pulse 71   Temp 98.2 F (36.8 C) (Oral)   Resp 14   SpO2 97%  Physical Exam Vitals and  nursing note reviewed.  Constitutional:      Appearance: He is well-developed. He is ill-appearing and diaphoretic. He is not toxic-appearing.  HENT:     Head: Normocephalic and atraumatic.  Eyes:     Pupils: Pupils are equal, round, and reactive to light.  Cardiovascular:     Rate and Rhythm: Regular rhythm. Tachycardia present.     Heart sounds: Normal heart sounds. No murmur heard. Pulmonary:     Effort: Pulmonary effort is normal. No respiratory distress.     Breath sounds: Normal breath sounds. No wheezing.  Abdominal:     Palpations: Abdomen is soft.     Tenderness: There is no abdominal tenderness. There is no rebound.  Musculoskeletal:     Cervical back: Neck supple.  Lymphadenopathy:     Cervical: No cervical adenopathy.  Skin:    General: Skin is warm.  Neurological:     Mental Status: He is alert and oriented to person, place, and time.  Psychiatric:        Mood and Affect: Mood normal.     ED Results / Procedures / Treatments   Labs (all labs ordered are listed, but only abnormal results are displayed) Labs Reviewed  CBC WITH DIFFERENTIAL/PLATELET - Abnormal; Notable for the following components:      Result Value   Platelets 39 (*)    All other components  within normal limits  COMPREHENSIVE METABOLIC PANEL - Abnormal; Notable for the following components:   Glucose, Bld 112 (*)    Creatinine, Ser 1.38 (*)    Alkaline Phosphatase 37 (*)    GFR, Estimated 58 (*)    Anion gap 16 (*)    All other components within normal limits    EKG None  Radiology No results found.  Procedures Procedures    Medications Ordered in ED Medications  cloNIDine (CATAPRES) tablet 0.1 mg (0.1 mg Oral Given 08/22/22 0416)  LORazepam (ATIVAN) tablet 1 mg (1 mg Oral Given 08/22/22 0558)  ondansetron (ZOFRAN-ODT) disintegrating tablet 4 mg (4 mg Oral Given 08/22/22 0558)    ED Course/ Medical Decision Making/ A&P Clinical Course as of 08/22/22 2325  Thu Aug 22, 2022  0614  On recheck, patient states he feels better after clonidine and Ativan.  He remains hypertensive but has not been taking his blood pressure medications.  He was previously followed by [facility].  Discussed with him resources for outpatient detox.  Will start him on clonidine for withdrawal symptoms which should also help his blood pressure.  Will also restart lisinopril. [CH]    Clinical Course User Index [CH] Charlayne Vultaggio, Mayer Masker, MD                             Medical Decision Making Amount and/or Complexity of Data Reviewed Labs: ordered.  Risk Prescription drug management.   This patient presents to the ED for concern of opioid withdrawal, hypertension, this involves an extensive number of treatment options, and is a complaint that carries with it a high risk of complications and morbidity.  I considered the following differential and admission for this acute, potentially life threatening condition.  The differential diagnosis includes essential hypertension, medication noncompliance, withdrawal hypertension  MDM:    This is a 62 year old male who presents with concern for opioid withdrawal.  He is significantly hypertensive but has not been taking his blood pressure medications.  Otherwise he is nontoxic.  He does appear uncomfortable.  He was given clonidine for withdrawal symptoms as well as Zofran.  Labs obtained and reviewed.  Notably thrombocytopenic which is new from most recent lab work 6 years ago.  He is not bleeding and his hemoglobin is normal.  Denies alcohol use.  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  Patient requested to continue detox at home.  He has no SI or HI.  I placed him back on his lisinopril but will also give him a short course of clonidine for his withdrawal symptoms which should help with his blood pressure in the short-term.  Recommend that he reengage with [facility] and wellness.  Additionally was given resources for detox.  (Labs, imaging, consults)  Labs: I Ordered,  and personally interpreted labs.  The pertinent results include: CBC, CMP  Imaging Studies ordered: I ordered imaging studies including none I independently visualized and interpreted imaging. I agree with the radiologist interpretation  Additional history obtained from chart review.  External records from outside source obtained and reviewed including prior evaluations  Cardiac Monitoring: The patient was maintained on a cardiac monitor.  If on the cardiac monitor, I personally viewed and interpreted the cardiac monitored which showed an underlying rhythm of: Sinus rhythm  Reevaluation: After the interventions noted above, I reevaluated the patient and found that they have :improved  Social Determinants of Health:  lives independently, substance abuse  Disposition: Discharge  Co morbidities that complicate the patient evaluation  Past Medical History:  Diagnosis Date   Back pain, chronic    2007 fell off truck   Hernia    Rt. Groin - repaired 1997   HTN (hypertension)      Medicines Meds ordered this encounter  Medications   cloNIDine (CATAPRES) tablet 0.1 mg   LORazepam (ATIVAN) tablet 1 mg   ondansetron (ZOFRAN-ODT) disintegrating tablet 4 mg   cloNIDine (CATAPRES) 0.1 MG tablet    Sig: Take 1 tablet (0.1 mg total) by mouth 2 (two) times daily.    Dispense:  10 tablet    Refill:  0   ondansetron (ZOFRAN-ODT) 4 MG disintegrating tablet    Sig: Take 1 tablet (4 mg total) by mouth every 8 (eight) hours as needed for nausea or vomiting.    Dispense:  20 tablet    Refill:  0   lisinopril-hydrochlorothiazide (ZESTORETIC) 20-25 MG tablet    Sig: Take 1 tablet by mouth daily. Schedule an appointment prior to next refill request.    Dispense:  30 tablet    Refill:  2    DX Code Needed  .    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)    -  Primary   Essential hypertension       Relevant Medications   cloNIDine (CATAPRES) tablet 0.1 mg (Completed)   cloNIDine (CATAPRES) 0.1 MG tablet   lisinopril-hydrochlorothiazide (ZESTORETIC) 20-25 MG tablet                   Final Clinical Impression(s) / ED Diagnoses Final diagnoses:  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   08/22/22 8657              Shon Baton, MD 08/22/22 2329

## 2022-08-22 NOTE — Discharge Instructions (Signed)
You were seen today for withdrawal symptoms.  See guide provided for detox facilities.  Take medications as prescribed.  You may take Imodium for any diarrhea.  Make sure that you are staying hydrated.

## 2022-09-09 ENCOUNTER — Ambulatory Visit (INDEPENDENT_AMBULATORY_CARE_PROVIDER_SITE_OTHER): Payer: Medicare Other | Admitting: Student

## 2022-09-09 ENCOUNTER — Encounter: Payer: Self-pay | Admitting: Student

## 2022-09-09 VITALS — BP 177/103 | HR 88 | Ht 73.0 in | Wt 165.0 lb

## 2022-09-09 DIAGNOSIS — I10 Essential (primary) hypertension: Secondary | ICD-10-CM | POA: Diagnosis not present

## 2022-09-09 DIAGNOSIS — F1190 Opioid use, unspecified, uncomplicated: Secondary | ICD-10-CM | POA: Diagnosis not present

## 2022-09-09 MED ORDER — AMLODIPINE BESYLATE 5 MG PO TABS
5.0000 mg | ORAL_TABLET | Freq: Every day | ORAL | 0 refills | Status: DC
Start: 2022-09-09 — End: 2022-10-28

## 2022-09-09 NOTE — Patient Instructions (Addendum)
It was great to see you today! Thank you for choosing [facility] for your primary care. Stephen Lambert was seen for ED follow up.  Today we addressed: We will add amlodipine to your regimen  I will check labs today I want to see you in 1 month as well.   Blood Pressure Record Sheet To take your blood pressure, you will need a blood pressure machine. You can buy a blood pressure machine (blood pressure monitor) at your clinic, drug store, or online. When choosing one, consider: An automatic monitor that has an arm cuff. A cuff that wraps snugly around your upper arm. You should be able to fit only one finger between your arm and the cuff. A device that stores blood pressure reading results. Do not choose a monitor that measures your blood pressure from your wrist or finger. Follow your health care provider's instructions for how to take your blood pressure. To use this form: Take your blood pressure medications every day These measurements should be taken when you have been at rest for at least 10-15 min Take at least 2 readings with each blood pressure check. This makes sure the results are correct. Wait 1-2 minutes between measurements. Write down the results in the spaces on this form. Keep in mind it should always be recorded systolic over diastolic. Both numbers are important.  Repeat this every day for 2-3 weeks, or as told by your health care provider.  Make a follow-up appointment with your health care provider to discuss the results.  Blood Pressure Log Date Medications taken? (Y/N) Blood Pressure Time of Day                                                                                                         If you haven't already, sign up for My Chart to have easy access to your labs results, and communication with your primary care physician.  I recommend that you always bring your medications to each appointment as this makes it easy to  ensure you are on the correct medications and helps [place] not miss refills when you need them. Call the clinic at [phone] if your symptoms worsen or you have any concerns.  You should return to our clinic Return in about 4 weeks (around 10/07/2022) for HTN. Please arrive 15 minutes before your appointment to ensure smooth check in process.  We appreciate your efforts in making this happen.  Thank you for allowing me to participate in your care, Alfredo Martinez, MD 09/09/2022, 2:28 PM PGY-3, [facility]

## 2022-09-09 NOTE — Assessment & Plan Note (Signed)
BP: (!) 177/103 today. Poorly controlled. Goal of <130/90. Continue to work on healthy dietary habits and exercise. Follow up in 4 weeks.   Medication regimen: Zestoretic 20-25mg  + Amlodipine 5 mg daily

## 2022-09-09 NOTE — Progress Notes (Signed)
  SUBJECTIVE:   CHIEF COMPLAINT / HPI:   Withdrawal  HTN Follow up Recently presented to the ED for withdrawal from fentanyl and was given Clonidine and Zestoretic for BP control and withdrawal symptoms. Wanted to continue with withdrawal at home and was able to be seen by [facility], currently taking methadone and no other type of drug.   130s/60s BP at home but higher in the clinic. Taking his dual BP medication since discharge from hospital.  Going to Methadone Clinic, [facility] [place], [place].   PERTINENT  PMH / PSH:  HTN Tobacco Use Chronic back pain   Patient Care Team: Alfredo Martinez, MD as PCP - General (Family Medicine) OBJECTIVE:  BP (!) 177/103   Pulse 88   Ht 6\' 1"  (1.854 m)   Wt 165 lb (74.8 kg)   SpO2 100%   BMI 21.77 kg/m  Physical Exam  General: Alert and oriented in no apparent distress Heart: Regular rate and rhythm with no murmurs appreciated Lungs: Normal WOB Skin: Warm and dry  ASSESSMENT/PLAN:  Primary hypertension Assessment & Plan: BP: (!) 177/103 today. Poorly controlled. Goal of <130/90. Continue to work on healthy dietary habits and exercise. Follow up in 4 weeks.   Medication regimen: Zestoretic 20-25mg  + Amlodipine 5 mg daily    Orders: -     amLODIPine Besylate; Take 1 tablet (5 mg total) by mouth at bedtime.  Dispense: 90 tablet; Refill: 0 -     Basic metabolic panel; Future  Essential hypertension  Methadone use Assessment & Plan: Receiving treatment at [facility] here in [place] with good success, no fentanyl use since starting with [facility]. May need EKG on next check.    BMP today due to AKI from recent ED visit.  Return in about 4 weeks (around 10/07/2022) for HTN. Alfredo Martinez, MD 09/10/2022, 1:08 PM PGY-3, [facility]

## 2022-09-10 DIAGNOSIS — F1190 Opioid use, unspecified, uncomplicated: Secondary | ICD-10-CM | POA: Insufficient documentation

## 2022-09-10 LAB — BASIC METABOLIC PANEL
BUN/Creatinine Ratio: 11 (ref 10–24)
BUN: 17 mg/dL (ref 8–27)
CO2: 27 mmol/L (ref 20–29)
Calcium: 10.2 mg/dL (ref 8.6–10.2)
Chloride: 94 mmol/L — ABNORMAL LOW (ref 96–106)
Creatinine, Ser: 1.48 mg/dL — ABNORMAL HIGH (ref 0.76–1.27)
Glucose: 89 mg/dL (ref 70–99)
Potassium: 3.6 mmol/L (ref 3.5–5.2)
Sodium: 141 mmol/L (ref 134–144)
eGFR: 53 mL/min/{1.73_m2} — ABNORMAL LOW (ref >59)

## 2022-09-10 NOTE — Assessment & Plan Note (Signed)
Receiving treatment at [facility] here in [place] with good success, no fentanyl use since starting with [facility]. May need EKG on next check.

## 2022-09-16 ENCOUNTER — Encounter: Payer: Self-pay | Admitting: Student

## 2022-09-30 ENCOUNTER — Ambulatory Visit (INDEPENDENT_AMBULATORY_CARE_PROVIDER_SITE_OTHER): Payer: Medicare Other

## 2022-09-30 VITALS — Ht 73.0 in | Wt 165.0 lb

## 2022-09-30 DIAGNOSIS — Z0000 Encounter for general adult medical examination without abnormal findings: Secondary | ICD-10-CM

## 2022-09-30 NOTE — Patient Instructions (Signed)
Stephen Lambert , Thank you for taking time to come for your Medicare Wellness Visit. I appreciate your ongoing commitment to your health goals. Please review the following plan we discussed and let me know if I can assist you in the future.   Referrals/Orders/Follow-Ups/Clinician Recommendations: Aim for 30 minutes of exercise or brisk walking, 6-8 glasses of water, and 5 servings of fruits and vegetables each day.  This is a list of the screening recommended for you and due dates:  Health Maintenance  Topic Date Due   Hepatitis C Screening  Never done   DTaP/Tdap/Td vaccine (1 - Tdap) Never done   Colon Cancer Screening  Never done   Zoster (Shingles) Vaccine (1 of 2) Never done   COVID-19 Vaccine (2 - 2023-24 season) 10/19/2021   Flu Shot  09/19/2022   Medicare Annual Wellness Visit  09/30/2023   HIV Screening  Completed   HPV Vaccine  Aged Out    Advanced directives: (ACP Link)Information on Advanced Care Planning can be found at [facility] Advance Health Care Directives Advance Health Care Directives ([email or web address]   Next Medicare Annual Wellness Visit scheduled for next year: Yes   Preventive Care 40-64 Years, Male Preventive care refers to lifestyle choices and visits with your health care provider that can promote health and wellness. What does preventive care include? A yearly physical exam. This is also called an annual well check. Dental exams once or twice a year. Routine eye exams. Ask your health care provider how often you should have your eyes checked. Personal lifestyle choices, including: Daily care of your teeth and gums. Regular physical activity. Eating a healthy diet. Avoiding tobacco and drug use. Limiting alcohol use. Practicing safe sex. Taking low-dose aspirin every day starting at age 82. What happens during an annual well check? The services and screenings done by your health care provider during your annual well check will depend on  your age, overall health, lifestyle risk factors, and family history of disease. Counseling  Your health care provider may ask you questions about your: Alcohol use. Tobacco use. Drug use. Emotional well-being. Home and relationship well-being. Sexual activity. Eating habits. Work and work Astronomer. Screening  You may have the following tests or measurements: Height, weight, and BMI. Blood pressure. Lipid and cholesterol levels. These may be checked every 5 years, or more frequently if you are over 29 years old. Skin check. Lung cancer screening. You may have this screening every year starting at age 68 if you have a 30-pack-year history of smoking and currently smoke or have quit within the past 15 years. Fecal occult blood test (FOBT) of the stool. You may have this test every year starting at age 41. Flexible sigmoidoscopy or colonoscopy. You may have a sigmoidoscopy every 5 years or a colonoscopy every 10 years starting at age 54. Prostate cancer screening. Recommendations will vary depending on your family history and other risks. Hepatitis C blood test. Hepatitis B blood test. Sexually transmitted disease (STD) testing. Diabetes screening. This is done by checking your blood sugar (glucose) after you have not eaten for a while (fasting). You may have this done every 1-3 years. Discuss your test results, treatment options, and if necessary, the need for more tests with your health care provider. Vaccines  Your health care provider may recommend certain vaccines, such as: Influenza vaccine. This is recommended every year. Tetanus, diphtheria, and acellular pertussis (Tdap, Td) vaccine. You may need a Td booster every 10 years. Zoster  vaccine. You may need this after age 24. Pneumococcal 13-valent conjugate (PCV13) vaccine. You may need this if you have certain conditions and have not been vaccinated. Pneumococcal polysaccharide (PPSV23) vaccine. You may need one or two doses if  you smoke cigarettes or if you have certain conditions. Talk to your health care provider about which screenings and vaccines you need and how often you need them. This information is not intended to replace advice given to you by your health care provider. Make sure you discuss any questions you have with your health care provider. Document Released: 03/03/2015 Document Revised: 10/25/2015 Document Reviewed: 12/06/2014 Elsevier Interactive Patient Education  2017 [employer].  Fall Prevention in the Home Falls can cause injuries. They can happen to people of all ages. There are many things you can do to make your home safe and to help prevent falls. What can I do on the outside of my home? Regularly fix the edges of walkways and driveways and fix any cracks. Remove anything that might make you trip as you walk through a door, such as a raised step or threshold. Trim any bushes or trees on the path to your home. Use bright outdoor lighting. Clear any walking paths of anything that might make someone trip, such as rocks or tools. Regularly check to see if handrails are loose or broken. Make sure that both sides of any steps have handrails. Any raised decks and porches should have guardrails on the edges. Have any leaves, snow, or ice cleared regularly. Use sand or salt on walking paths during winter. Clean up any spills in your garage right away. This includes oil or grease spills. What can I do in the bathroom? Use night lights. Install grab bars by the toilet and in the tub and shower. Do not use towel bars as grab bars. Use non-skid mats or decals in the tub or shower. If you need to sit down in the shower, use a plastic, non-slip stool. Keep the floor dry. Clean up any water that spills on the floor as soon as it happens. Remove soap buildup in the tub or shower regularly. Attach bath mats securely with double-sided non-slip rug tape. Do not have throw rugs and other things on the  floor that can make you trip. What can I do in the bedroom? Use night lights. Make sure that you have a light by your bed that is easy to reach. Do not use any sheets or blankets that are too big for your bed. They should not hang down onto the floor. Have a firm chair that has side arms. You can use this for support while you get dressed. Do not have throw rugs and other things on the floor that can make you trip. What can I do in the kitchen? Clean up any spills right away. Avoid walking on wet floors. Keep items that you use a lot in easy-to-reach places. If you need to reach something above you, use a strong step stool that has a grab bar. Keep electrical cords out of the way. Do not use floor polish or wax that makes floors slippery. If you must use wax, use non-skid floor wax. Do not have throw rugs and other things on the floor that can make you trip. What can I do with my stairs? Do not leave any items on the stairs. Make sure that there are handrails on both sides of the stairs and use them. Fix handrails that are broken or loose. Make  sure that handrails are as long as the stairways. Check any carpeting to make sure that it is firmly attached to the stairs. Fix any carpet that is loose or worn. Avoid having throw rugs at the top or bottom of the stairs. If you do have throw rugs, attach them to the floor with carpet tape. Make sure that you have a light switch at the top of the stairs and the bottom of the stairs. If you do not have them, ask someone to add them for you. What else can I do to help prevent falls? Wear shoes that: Do not have high heels. Have rubber bottoms. Are comfortable and fit you well. Are closed at the toe. Do not wear sandals. If you use a stepladder: Make sure that it is fully opened. Do not climb a closed stepladder. Make sure that both sides of the stepladder are locked into place. Ask someone to hold it for you, if possible. Clearly mark and make  sure that you can see: Any grab bars or handrails. First and last steps. Where the edge of each step is. Use tools that help you move around (mobility aids) if they are needed. These include: Canes. Walkers. Scooters. Crutches. Turn on the lights when you go into a dark area. Replace any light bulbs as soon as they burn out. Set up your furniture so you have a clear path. Avoid moving your furniture around. If any of your floors are uneven, fix them. If there are any pets around you, be aware of where they are. Review your medicines with your doctor. Some medicines can make you feel dizzy. This can increase your chance of falling. Ask your doctor what other things that you can do to help prevent falls. This information is not intended to replace advice given to you by your health care provider. Make sure you discuss any questions you have with your health care provider. Document Released: 12/01/2008 Document Revised: 07/13/2015 Document Reviewed: 03/11/2014 Elsevier Interactive Patient Education  2017 [employer].

## 2022-09-30 NOTE — Progress Notes (Signed)
Subjective:   Stephen Lambert is a 62 y.o. male who presents for an Initial Medicare Annual Wellness Visit.  Visit Complete: Virtual  I connected with  [name] on 09/30/22 by a audio enabled telemedicine application and verified that I am speaking with the correct person using two identifiers.  Patient Location: Home  Provider Location: Home Office  I discussed the limitations of evaluation and management by telemedicine. The patient expressed understanding and agreed to proceed.  Vital Signs: Unable to obtain new vitals due to this being a telehealth visit.  Review of Systems     Cardiac Risk Factors include: advanced age (>33men, >36 women);hypertension;male gender;smoking/ tobacco exposure     Objective:    Today's Vitals   09/30/22 1542  Weight: 165 lb (74.8 kg)  Height: 6\' 1"  (1.854 m)   Body mass index is 21.77 kg/m.     09/30/2022    3:45 PM 09/09/2022    1:56 PM 08/22/2022    3:41 AM 11/30/2015   10:07 AM 03/10/2015    3:56 PM 11/01/2014   10:15 AM 09/21/2014   10:06 AM  Advanced Directives  Does Patient Have a Medical Advance Directive? No No No No No No No  Would patient like information on creating a medical advance directive? Yes (MAU/Ambulatory/Procedural Areas - Information given) No - Patient declined  No - patient declined information No - patient declined information No - patient declined information Yes - Educational materials given    Current Medications (verified) Outpatient Encounter Medications as of 09/30/2022  Medication Sig   amLODipine (NORVASC) 5 MG tablet Take 1 tablet (5 mg total) by mouth at bedtime.   lisinopril-hydrochlorothiazide (ZESTORETIC) 20-25 MG tablet Take 1 tablet by mouth daily. Schedule an appointment prior to next refill request.   No facility-administered encounter medications on file as of 09/30/2022.    Allergies (verified) Patient has no known allergies.   History: Past Medical History:  Diagnosis Date    Back pain, chronic    2007 fell off truck   Hernia    Rt. Groin - repaired 1997   HTN (hypertension)    Past Surgical History:  Procedure Laterality Date   HERNIA REPAIR     1997   LAPAROSCOPIC APPENDECTOMY N/A 11/29/2015   Procedure: APPENDECTOMY LAPAROSCOPIC;  Surgeon: Gaynelle Adu, MD;  Location: [facility] OR;  Service: General;  Laterality: N/A;   Family History  Problem Relation Age of Onset   Cancer Mother        lung cancer 1997   Hypertension Father    Diabetes Father    Social History   Socioeconomic History   Marital status: Single    Spouse name: Not on file   Number of children: 3   Years of education: Not on file   Highest education level: Not on file  Occupational History   Occupation: disabled  Tobacco Use   Smoking status: Every Day    Current packs/day: 0.25    Average packs/day: 0.3 packs/day for 30.0 years (7.5 ttl pk-yrs)    Types: Cigarettes   Smokeless tobacco: Never   Tobacco comments:    quit from 2002-2007  Substance and Sexual Activity   Alcohol use: No   Drug use: No   Sexual activity: Not on file  Other Topics Concern   Not on file  Social History Narrative   Lives with wife at home. Does not work, on [employer]. 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         Social Determinants of Health   Financial Resource Strain: Low Risk  (09/30/2022)   Overall Financial Resource Strain (CARDIA)    Difficulty of Paying Living Expenses: Not hard at all  Food Insecurity: No Food Insecurity (09/30/2022)   Hunger Vital Sign    Worried About Running Out of Food in the Last Year: Never true    Ran Out of Food in the Last Year: Never true  Transportation Needs: No Transportation Needs (09/30/2022)   PRAPARE - Administrator, Civil Service (Medical): No    Lack of Transportation (Non-Medical): No  Physical Activity: Insufficiently Active (09/30/2022)   Exercise Vital Sign    Days of Exercise per Week: 3 days     Minutes of Exercise per Session: 30 min  Stress: No Stress Concern Present (09/30/2022)   [employer] of Occupational Health - Occupational Stress Questionnaire    Feeling of Stress : Not at all  Social Connections: Moderately Integrated (09/30/2022)   Social Connection and Isolation Panel [NHANES]    Frequency of Communication with Friends and Family: More than three times a week    Frequency of Social Gatherings with Friends and Family: Three times a week    Attends Religious Services: 1 to 4 times per year    Active Member of Clubs or Organizations: No    Attends Banker Meetings: Never    Marital Status: Married    Tobacco Counseling Ready to quit: Not Answered Counseling given: Not Answered Tobacco comments: quit from 2002-2007   Clinical Intake:  Pre-visit preparation completed: Yes  Pain : No/denies pain     Diabetes: No  How often do you need to have someone help you when you read instructions, pamphlets, or other written materials from your doctor or pharmacy?: 1 - Never  Interpreter Needed?: No  Information entered by :: Kandis Fantasia LPN   Activities of Daily Living    09/30/2022    3:43 PM  In your present state of health, do you have any difficulty performing the following activities:  Hearing? 0  Vision? 0  Difficulty concentrating or making decisions? 0  Walking or climbing stairs? 0  Dressing or bathing? 0  Doing errands, shopping? 0  Preparing Food and eating ? N  Using the Toilet? N  In the past six months, have you accidently leaked urine? N  Do you have problems with loss of bowel control? N  Managing your Medications? N  Managing your Finances? N  Housekeeping or managing your Housekeeping? N    Patient Care Team: Alfredo Martinez, MD as PCP - General (Family Medicine)  Indicate any recent Medical Services you may have received from other than [facility] providers in the past year (date may be approximate).     Assessment:    This is a routine wellness examination for Stephen Lambert.  Hearing/Vision screen Hearing Screening - Comments:: Denies hearing difficulties   Vision Screening - Comments:: No vision problems; will schedule routine eye exam soon    Dietary issues and exercise activities discussed:     Goals Addressed             This Visit's Progress    Remain active and independent        Depression Screen    09/30/2022    3:43 PM 09/09/2022    1:58 PM 09/10/2019    1:42 PM 07/02/2017    1:36 PM 03/10/2015  3:56 PM 09/21/2014   10:06 AM 11/15/2013    2:51 PM  PHQ 2/9 Scores  PHQ - 2 Score 3 3 3  0 0 1 0  PHQ- 9 Score 8 9 6         Fall Risk    09/30/2022    3:45 PM 03/10/2015    3:56 PM 09/21/2014   10:06 AM  Fall Risk   Falls in the past year? 0 No Yes  Number falls in past yr: 0  1  Injury with Fall? 0  Yes  Risk for fall due to : No Fall Risks    Follow up Falls prevention discussed;Education provided;Falls evaluation completed      MEDICARE RISK AT HOME:  Medicare Risk at Home - 09/30/22 1545     Any stairs in or around the home? No    If so, are there any without handrails? No    Home free of loose throw rugs in walkways, pet beds, electrical cords, etc? Yes    Adequate lighting in your home to reduce risk of falls? Yes    Life alert? No    Use of a cane, walker or w/c? No    Grab bars in the bathroom? Yes    Shower chair or bench in shower? No    Elevated toilet seat or a handicapped toilet? No             TIMED UP AND GO:  Was the test performed? No    Cognitive Function:        09/30/2022    3:45 PM  6CIT Screen  What Year? 0 points  What month? 0 points  What time? 0 points  Count back from 20 0 points  Months in reverse 2 points  Repeat phrase 0 points  Total Score 2 points    Immunizations Immunization History  Administered Date(s) Administered   Influenza Inj Mdck Quad Pf 01/04/2017   Influenza, Quadrivalent, Recombinant, Inj, Pf 12/08/2017,  01/28/2020   Influenza,inj,Quad PF,6+ Mos 11/15/2013, 11/22/2014, 11/23/2015   PFIZER Comirnaty(Gray Top)Covid-19 Tri-Sucrose Vaccine 09/25/2020    TDAP status: Due, Education has been provided regarding the importance of this vaccine. Advised may receive this vaccine at local pharmacy or Health Dept. Aware to provide a copy of the vaccination record if obtained from local pharmacy or Health Dept. Verbalized acceptance and understanding.  Flu Vaccine status: Declined, Education has been provided regarding the importance of this vaccine but patient still declined. Advised may receive this vaccine at local pharmacy or Health Dept. Aware to provide a copy of the vaccination record if obtained from local pharmacy or Health Dept. Verbalized acceptance and understanding.  Pneumococcal vaccine status: Up to date  Covid-19 vaccine status: Declined, Education has been provided regarding the importance of this vaccine but patient still declined. Advised may receive this vaccine at local pharmacy or Health Dept.or vaccine clinic. Aware to provide a copy of the vaccination record if obtained from local pharmacy or Health Dept. Verbalized acceptance and understanding.  Qualifies for Shingles Vaccine? Yes   Zostavax completed No   Shingrix Completed?: No.    Education has been provided regarding the importance of this vaccine. Patient has been advised to call insurance company to determine out of pocket expense if they have not yet received this vaccine. Advised may also receive vaccine at local pharmacy or Health Dept. Verbalized acceptance and understanding.  Screening Tests Health Maintenance  Topic Date Due   Hepatitis C Screening  Never done  DTaP/Tdap/Td (1 - Tdap) Never done   Colonoscopy  Never done   Zoster Vaccines- Shingrix (1 of 2) Never done   COVID-19 Vaccine (2 - 2023-24 season) 10/19/2021   INFLUENZA VACCINE  09/19/2022   Medicare Annual Wellness (AWV)  09/30/2023   HIV Screening   Completed   HPV VACCINES  Aged Out    Health Maintenance  Health Maintenance Due  Topic Date Due   Hepatitis C Screening  Never done   DTaP/Tdap/Td (1 - Tdap) Never done   Colonoscopy  Never done   Zoster Vaccines- Shingrix (1 of 2) Never done   COVID-19 Vaccine (2 - 2023-24 season) 10/19/2021   INFLUENZA VACCINE  09/19/2022    Colorectal cancer screening:  Patient declines at this time  Lung Cancer Screening: (Low Dose CT Chest recommended if Age 14-80 years, 20 pack-year currently smoking OR have quit w/in 15years.) does not qualify.   Lung Cancer Screening Referral: n/a  Additional Screening:  Hepatitis C Screening: does qualify;  Vision Screening: Recommended annual ophthalmology exams for early detection of glaucoma and other disorders of the eye. Is the patient up to date with their annual eye exam?  No  Who is the provider or what is the name of the office in which the patient attends annual eye exams? none If pt is not established with a provider, would they like to be referred to a provider to establish care? No .   Dental Screening: Recommended annual dental exams for proper oral hygiene  Community Resource Referral / Chronic Care Management: CRR required this visit?  No   CCM required this visit?  No    Plan:     I have personally reviewed and noted the following in the patient's chart:   Medical and social history Use of alcohol, tobacco or illicit drugs  Current medications and supplements including opioid prescriptions. Patient is not currently taking opioid prescriptions. Functional ability and status Nutritional status Physical activity Advanced directives List of other physicians Hospitalizations, surgeries, and ER visits in previous 12 months Vitals Screenings to include cognitive, depression, and falls Referrals and appointments  In addition, I have reviewed and discussed with patient certain preventive protocols, quality metrics, and best  practice recommendations. A written personalized care plan for preventive services as well as general preventive health recommendations were provided to patient.     Kandis Fantasia [place], [place]   6/96/2952   After Visit Summary: (Mail) Due to this being a telephonic visit, the after visit summary with patients personalized plan was offered to patient via mail   Nurse Notes: No concerns at this time;  scheduled for a followup visit for htn

## 2022-10-15 ENCOUNTER — Ambulatory Visit (HOSPITAL_COMMUNITY)
Admission: RE | Admit: 2022-10-15 | Discharge: 2022-10-15 | Disposition: A | Discharge status: Home / Self Care | Payer: Medicare Other | Source: Ambulatory Visit | Attending: Family Medicine | Admitting: Family Medicine

## 2022-10-15 ENCOUNTER — Other Ambulatory Visit: Payer: Self-pay

## 2022-10-15 ENCOUNTER — Ambulatory Visit (INDEPENDENT_AMBULATORY_CARE_PROVIDER_SITE_OTHER): Payer: Medicare Other | Admitting: Student

## 2022-10-15 VITALS — BP 130/64 | HR 60 | Ht 73.0 in | Wt 173.0 lb

## 2022-10-15 DIAGNOSIS — R7989 Other specified abnormal findings of blood chemistry: Secondary | ICD-10-CM

## 2022-10-15 DIAGNOSIS — I10 Essential (primary) hypertension: Secondary | ICD-10-CM | POA: Diagnosis not present

## 2022-10-15 DIAGNOSIS — F1190 Opioid use, unspecified, uncomplicated: Secondary | ICD-10-CM

## 2022-10-15 NOTE — Assessment & Plan Note (Signed)
Recheck today, may be new normal. If persistently elevated, check if spilling protein.

## 2022-10-15 NOTE — Assessment & Plan Note (Signed)
BP: 130/64 today. Well controlled. Goal of <140/90. Continue to work on healthy dietary habits and exercise. Follow up in 3 months.   Medication regimen: Zestoretic 20-25mg  + Amlodipine 5 mg daily

## 2022-10-15 NOTE — Progress Notes (Signed)
  SUBJECTIVE:   CHIEF COMPLAINT / HPI:   Hypertension: BP: 130/64 today. Home medications include: Zestoretic 20-25mg  + Amlodipine 5 mg daily. He endorses taking these medications as prescribed. Does check blood pressure at home.  Most recent creatinine trend:  Lab Results  Component Value Date   CREATININE 1.48 (H) 09/09/2022   CREATININE 1.38 (H) 08/22/2022   CREATININE 1.25 09/10/2019   Patient has had a BMP in the past 1 year.    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 Needs baseline EKG.    PERTINENT  PMH / PSH:   HTN Methadone Use  Patient Care Team: Alfredo Martinez, MD as PCP - General (Family Medicine) OBJECTIVE:  BP 130/64   Pulse 60   Ht 6\' 1"  (1.854 m)   Wt 173 lb (78.5 kg)   SpO2 100%   BMI 22.82 kg/m  Physical Exam  General: NAD, pleasant, able to participate in exam Card: RRR Respiratory: No respiratory distress Skin: warm and dry, no rashes noted Psych: Normal affect and mood  EKG: Sinus brady to 50, QT 406  ASSESSMENT/PLAN:  Methadone use Assessment & Plan: EKG check today, QT 406 with bradycardia--monitoring for methadone side effects, discussed with patient. Strict return precautions given, contact if methadone dosing changes.   Orders: -     EKG 12-Lead -     EKG 12-Lead -     Basic metabolic panel  Primary hypertension Assessment & Plan: BP: 130/64 today. Well controlled. Goal of <140/90. Continue to work on healthy dietary habits and exercise. Follow up in 3 months.   Medication regimen: Zestoretic 20-25mg  + Amlodipine 5 mg daily   Orders: -     Basic metabolic panel  Elevated serum creatinine Assessment & Plan: Recheck today, may be new normal. If persistently elevated, check if spilling protein.     Return in about 3 months (around 01/15/2023) for HTN follow up. Alfredo Martinez, MD 10/15/2022, 2:15 PM PGY-3, [facility]

## 2022-10-15 NOTE — Patient Instructions (Addendum)
It was great to see you today! Thank you for choosing [facility] for your primary care.  Today we addressed: Bps look great!! Continue with current medications  We will recheck BMP to see kidney function again today  Call if you change your methadone dosing --return if you experience chest pain, heart palpitations, shortness of breath, dizziness, confusion   Blood Pressure Record Sheet To take your blood pressure, you will need a blood pressure machine. You can buy a blood pressure machine (blood pressure monitor) at your clinic, drug store, or online. When choosing one, consider: An automatic monitor that has an arm cuff. A cuff that wraps snugly around your upper arm. You should be able to fit only one finger between your arm and the cuff. A device that stores blood pressure reading results. Do not choose a monitor that measures your blood pressure from your wrist or finger. Follow your health care provider's instructions for how to take your blood pressure. To use this form: Take your blood pressure medications every day These measurements should be taken when you have been at rest for at least 10-15 min Take at least 2 readings with each blood pressure check. This makes sure the results are correct. Wait 1-2 minutes between measurements. Write down the results in the spaces on this form. Keep in mind it should always be recorded systolic over diastolic. Both numbers are important.  Repeat this every day for 2-3 weeks, or as told by your health care provider.  Make a follow-up appointment with your health care provider to discuss the results.  Blood Pressure Log Date Medications taken? (Y/N) Blood Pressure Time of Day                                                                                                       You can check pressures a few times a week, call if they are >140/90 consecutively   If you haven't already, sign up for My Chart to have  easy access to your labs results, and communication with your primary care physician. I recommend that you always bring your medications to each appointment as this makes it easy to ensure you are on the correct medications and helps [place] not miss refills when you need them. Call the clinic at [phone] if your symptoms worsen or you have any concerns. Return in about 3 months (around 01/15/2023) for HTN follow up. Please arrive 15 minutes before your appointment to ensure smooth check in process.  We appreciate your efforts in making this happen.  Thank you for allowing me to participate in your care, Alfredo Martinez, MD 10/15/2022, 2:12 PM PGY-3, [facility]

## 2022-10-15 NOTE — Assessment & Plan Note (Signed)
EKG check today, QT 406 with bradycardia--monitoring for methadone side effects, discussed with patient. Strict return precautions given, contact if methadone dosing changes.

## 2022-10-16 LAB — BASIC METABOLIC PANEL
BUN/Creatinine Ratio: 16 (ref 10–24)
BUN: 25 mg/dL (ref 8–27)
CO2: 26 mmol/L (ref 20–29)
Calcium: 9.1 mg/dL (ref 8.6–10.2)
Chloride: 101 mmol/L (ref 96–106)
Creatinine, Ser: 1.52 mg/dL — ABNORMAL HIGH (ref 0.76–1.27)
Glucose: 73 mg/dL (ref 70–99)
Potassium: 4.4 mmol/L (ref 3.5–5.2)
Sodium: 142 mmol/L (ref 134–144)
eGFR: 51 mL/min/{1.73_m2} — ABNORMAL LOW (ref >59)

## 2022-10-28 ENCOUNTER — Other Ambulatory Visit: Payer: Self-pay | Admitting: Student

## 2022-10-28 DIAGNOSIS — I10 Essential (primary) hypertension: Secondary | ICD-10-CM

## 2022-10-30 ENCOUNTER — Other Ambulatory Visit: Payer: Self-pay | Admitting: Student

## 2022-10-30 ENCOUNTER — Encounter: Payer: Self-pay | Admitting: Student

## 2022-10-30 DIAGNOSIS — I10 Essential (primary) hypertension: Secondary | ICD-10-CM

## 2022-10-30 DIAGNOSIS — R7989 Other specified abnormal findings of blood chemistry: Secondary | ICD-10-CM

## 2022-11-06 ENCOUNTER — Other Ambulatory Visit: Payer: Self-pay

## 2022-11-06 DIAGNOSIS — I10 Essential (primary) hypertension: Secondary | ICD-10-CM

## 2022-11-06 MED ORDER — LISINOPRIL-HYDROCHLOROTHIAZIDE 20-25 MG PO TABS
1.0000 | ORAL_TABLET | Freq: Every day | ORAL | 2 refills | Status: DC
Start: 2022-11-06 — End: 2022-12-02

## 2022-11-21 ENCOUNTER — Other Ambulatory Visit: Payer: Self-pay

## 2022-11-21 ENCOUNTER — Encounter (HOSPITAL_COMMUNITY): Payer: Self-pay | Admitting: Emergency Medicine

## 2022-11-21 ENCOUNTER — Inpatient Hospital Stay (HOSPITAL_COMMUNITY)
Admission: EM | Admit: 2022-11-21 | Discharge: 2022-12-02 | DRG: 177 | Disposition: A | Discharge status: Home / Self Care | Payer: Medicare Other | Attending: Family Medicine | Admitting: Family Medicine

## 2022-11-21 ENCOUNTER — Emergency Department (HOSPITAL_COMMUNITY): Payer: Medicare Other

## 2022-11-21 ENCOUNTER — Observation Stay (HOSPITAL_COMMUNITY): Payer: Medicare Other

## 2022-11-21 DIAGNOSIS — Z66 Do not resuscitate: Secondary | ICD-10-CM | POA: Diagnosis present

## 2022-11-21 DIAGNOSIS — Z6822 Body mass index (BMI) 22.0-22.9, adult: Secondary | ICD-10-CM

## 2022-11-21 DIAGNOSIS — N281 Cyst of kidney, acquired: Secondary | ICD-10-CM | POA: Diagnosis present

## 2022-11-21 DIAGNOSIS — Z79899 Other long term (current) drug therapy: Secondary | ICD-10-CM

## 2022-11-21 DIAGNOSIS — R590 Localized enlarged lymph nodes: Secondary | ICD-10-CM | POA: Diagnosis present

## 2022-11-21 DIAGNOSIS — D649 Anemia, unspecified: Secondary | ICD-10-CM | POA: Insufficient documentation

## 2022-11-21 DIAGNOSIS — N179 Acute kidney failure, unspecified: Secondary | ICD-10-CM | POA: Diagnosis present

## 2022-11-21 DIAGNOSIS — J869 Pyothorax without fistula: Secondary | ICD-10-CM

## 2022-11-21 DIAGNOSIS — Z801 Family history of malignant neoplasm of trachea, bronchus and lung: Secondary | ICD-10-CM

## 2022-11-21 DIAGNOSIS — Z8249 Family history of ischemic heart disease and other diseases of the circulatory system: Secondary | ICD-10-CM

## 2022-11-21 DIAGNOSIS — Z716 Tobacco abuse counseling: Secondary | ICD-10-CM

## 2022-11-21 DIAGNOSIS — K760 Fatty (change of) liver, not elsewhere classified: Secondary | ICD-10-CM | POA: Diagnosis present

## 2022-11-21 DIAGNOSIS — J690 Pneumonitis due to inhalation of food and vomit: Secondary | ICD-10-CM | POA: Diagnosis not present

## 2022-11-21 DIAGNOSIS — I2699 Other pulmonary embolism without acute cor pulmonale: Secondary | ICD-10-CM | POA: Insufficient documentation

## 2022-11-21 DIAGNOSIS — R160 Hepatomegaly, not elsewhere classified: Secondary | ICD-10-CM | POA: Diagnosis not present

## 2022-11-21 DIAGNOSIS — Z4682 Encounter for fitting and adjustment of non-vascular catheter: Secondary | ICD-10-CM

## 2022-11-21 DIAGNOSIS — J189 Pneumonia, unspecified organism: Principal | ICD-10-CM | POA: Diagnosis present

## 2022-11-21 DIAGNOSIS — G8929 Other chronic pain: Secondary | ICD-10-CM | POA: Diagnosis present

## 2022-11-21 DIAGNOSIS — I700 Atherosclerosis of aorta: Secondary | ICD-10-CM | POA: Diagnosis present

## 2022-11-21 DIAGNOSIS — D72829 Elevated white blood cell count, unspecified: Secondary | ICD-10-CM | POA: Insufficient documentation

## 2022-11-21 DIAGNOSIS — J90 Pleural effusion, not elsewhere classified: Secondary | ICD-10-CM

## 2022-11-21 DIAGNOSIS — F17210 Nicotine dependence, cigarettes, uncomplicated: Secondary | ICD-10-CM | POA: Diagnosis present

## 2022-11-21 DIAGNOSIS — Z1152 Encounter for screening for COVID-19: Secondary | ICD-10-CM

## 2022-11-21 DIAGNOSIS — D509 Iron deficiency anemia, unspecified: Secondary | ICD-10-CM | POA: Diagnosis present

## 2022-11-21 DIAGNOSIS — E876 Hypokalemia: Secondary | ICD-10-CM | POA: Diagnosis present

## 2022-11-21 DIAGNOSIS — E873 Alkalosis: Secondary | ICD-10-CM | POA: Diagnosis present

## 2022-11-21 DIAGNOSIS — I2693 Single subsegmental pulmonary embolism without acute cor pulmonale: Secondary | ICD-10-CM | POA: Diagnosis present

## 2022-11-21 DIAGNOSIS — J860 Pyothorax with fistula: Secondary | ICD-10-CM | POA: Diagnosis present

## 2022-11-21 DIAGNOSIS — R64 Cachexia: Secondary | ICD-10-CM | POA: Diagnosis present

## 2022-11-21 DIAGNOSIS — I10 Essential (primary) hypertension: Secondary | ICD-10-CM | POA: Diagnosis present

## 2022-11-21 DIAGNOSIS — E041 Nontoxic single thyroid nodule: Secondary | ICD-10-CM | POA: Diagnosis present

## 2022-11-21 DIAGNOSIS — F1190 Opioid use, unspecified, uncomplicated: Secondary | ICD-10-CM | POA: Diagnosis present

## 2022-11-21 DIAGNOSIS — E079 Disorder of thyroid, unspecified: Secondary | ICD-10-CM | POA: Insufficient documentation

## 2022-11-21 DIAGNOSIS — J918 Pleural effusion in other conditions classified elsewhere: Secondary | ICD-10-CM | POA: Diagnosis present

## 2022-11-21 DIAGNOSIS — J9811 Atelectasis: Secondary | ICD-10-CM | POA: Diagnosis present

## 2022-11-21 DIAGNOSIS — M549 Dorsalgia, unspecified: Secondary | ICD-10-CM | POA: Diagnosis present

## 2022-11-21 DIAGNOSIS — R011 Cardiac murmur, unspecified: Secondary | ICD-10-CM | POA: Diagnosis present

## 2022-11-21 DIAGNOSIS — F1191 Opioid use, unspecified, in remission: Secondary | ICD-10-CM | POA: Insufficient documentation

## 2022-11-21 DIAGNOSIS — R600 Localized edema: Secondary | ICD-10-CM | POA: Diagnosis present

## 2022-11-21 DIAGNOSIS — Z79891 Long term (current) use of opiate analgesic: Secondary | ICD-10-CM

## 2022-11-21 DIAGNOSIS — F1111 Opioid abuse, in remission: Secondary | ICD-10-CM | POA: Diagnosis present

## 2022-11-21 DIAGNOSIS — K5900 Constipation, unspecified: Secondary | ICD-10-CM | POA: Diagnosis not present

## 2022-11-21 HISTORY — DX: Opioid abuse, uncomplicated: F11.10

## 2022-11-21 HISTORY — DX: Tobacco use: Z72.0

## 2022-11-21 HISTORY — DX: Cannabis abuse, uncomplicated: F12.10

## 2022-11-21 HISTORY — DX: Pyothorax without fistula: J86.9

## 2022-11-21 HISTORY — DX: Opioid dependence, uncomplicated: F11.20

## 2022-11-21 LAB — HEPATITIS PANEL, ACUTE
HCV Ab: NONREACTIVE
Hep A IgM: NONREACTIVE
Hep B C IgM: NONREACTIVE
Hepatitis B Surface Ag: NONREACTIVE

## 2022-11-21 LAB — CBC WITH DIFFERENTIAL/PLATELET
Abs Immature Granulocytes: 1.2 10*3/uL — ABNORMAL HIGH (ref 0.00–0.07)
Basophils Absolute: 0.1 10*3/uL (ref 0.0–0.1)
Basophils Relative: 0 %
Eosinophils Absolute: 0.3 10*3/uL (ref 0.0–0.5)
Eosinophils Relative: 1 %
HCT: 31.7 % — ABNORMAL LOW (ref 39.0–52.0)
Hemoglobin: 10.1 g/dL — ABNORMAL LOW (ref 13.0–17.0)
Immature Granulocytes: 4 %
Lymphocytes Relative: 4 %
Lymphs Abs: 1.2 10*3/uL (ref 0.7–4.0)
MCH: 25.7 pg — ABNORMAL LOW (ref 26.0–34.0)
MCHC: 31.9 g/dL (ref 30.0–36.0)
MCV: 80.7 fL (ref 80.0–100.0)
Monocytes Absolute: 2.8 10*3/uL — ABNORMAL HIGH (ref 0.1–1.0)
Monocytes Relative: 9 %
Neutro Abs: 26.4 10*3/uL — ABNORMAL HIGH (ref 1.7–7.7)
Neutrophils Relative %: 82 %
Platelets: 333 10*3/uL (ref 150–400)
RBC: 3.93 MIL/uL — ABNORMAL LOW (ref 4.22–5.81)
RDW: 14.2 % (ref 11.5–15.5)
WBC: 32 10*3/uL — ABNORMAL HIGH (ref 4.0–10.5)
nRBC: 0 % (ref 0.0–0.2)

## 2022-11-21 LAB — I-STAT VENOUS BLOOD GAS, ED
Acid-Base Excess: 4 mmol/L — ABNORMAL HIGH (ref 0.0–2.0)
Bicarbonate: 27.7 mmol/L (ref 20.0–28.0)
Calcium, Ion: 1.04 mmol/L — ABNORMAL LOW (ref 1.15–1.40)
HCT: 32 % — ABNORMAL LOW (ref 39.0–52.0)
Hemoglobin: 10.9 g/dL — ABNORMAL LOW (ref 13.0–17.0)
O2 Saturation: 96 %
Potassium: 3.6 mmol/L (ref 3.5–5.1)
Sodium: 138 mmol/L (ref 135–145)
TCO2: 29 mmol/L (ref 22–32)
pCO2, Ven: 37.2 mm[Hg] — ABNORMAL LOW (ref 44–60)
pH, Ven: 7.479 — ABNORMAL HIGH (ref 7.25–7.43)
pO2, Ven: 75 mm[Hg] — ABNORMAL HIGH (ref 32–45)

## 2022-11-21 LAB — COMPREHENSIVE METABOLIC PANEL
ALT: 18 U/L (ref 0–44)
AST: 21 U/L (ref 15–41)
Albumin: 1.8 g/dL — ABNORMAL LOW (ref 3.5–5.0)
Alkaline Phosphatase: 108 U/L (ref 38–126)
Anion gap: 16 — ABNORMAL HIGH (ref 5–15)
BUN: 34 mg/dL — ABNORMAL HIGH (ref 8–23)
CO2: 24 mmol/L (ref 22–32)
Calcium: 8.2 mg/dL — ABNORMAL LOW (ref 8.9–10.3)
Chloride: 97 mmol/L — ABNORMAL LOW (ref 98–111)
Creatinine, Ser: 1.83 mg/dL — ABNORMAL HIGH (ref 0.61–1.24)
GFR, Estimated: 41 mL/min — ABNORMAL LOW (ref >60)
Glucose, Bld: 92 mg/dL (ref 70–99)
Potassium: 3.9 mmol/L (ref 3.5–5.1)
Sodium: 137 mmol/L (ref 135–145)
Total Bilirubin: 0.7 mg/dL (ref 0.3–1.2)
Total Protein: 5.8 g/dL — ABNORMAL LOW (ref 6.5–8.1)

## 2022-11-21 LAB — TSH: TSH: 0.915 u[IU]/mL (ref 0.350–4.500)

## 2022-11-21 LAB — RAPID URINE DRUG SCREEN, HOSP PERFORMED
Amphetamines: NOT DETECTED
Barbiturates: NOT DETECTED
Benzodiazepines: NOT DETECTED
Cocaine: NOT DETECTED
Opiates: NOT DETECTED
Tetrahydrocannabinol: NOT DETECTED

## 2022-11-21 LAB — SARS CORONAVIRUS 2 BY RT PCR: SARS Coronavirus 2 by RT PCR: NEGATIVE

## 2022-11-21 LAB — URINALYSIS, ROUTINE W REFLEX MICROSCOPIC
Bilirubin Urine: NEGATIVE
Glucose, UA: NEGATIVE mg/dL
Hgb urine dipstick: NEGATIVE
Ketones, ur: NEGATIVE mg/dL
Leukocytes,Ua: NEGATIVE
Nitrite: NEGATIVE
Protein, ur: NEGATIVE mg/dL
Specific Gravity, Urine: 1.026 (ref 1.005–1.030)
pH: 6 (ref 5.0–8.0)

## 2022-11-21 LAB — HEPARIN LEVEL (UNFRACTIONATED): Heparin Unfractionated: <0.1 [IU]/mL — ABNORMAL LOW (ref 0.30–0.70)

## 2022-11-21 LAB — HIV ANTIBODY (ROUTINE TESTING W REFLEX): HIV Screen 4th Generation wRfx: NONREACTIVE

## 2022-11-21 LAB — TROPONIN I (HIGH SENSITIVITY)
Troponin I (High Sensitivity): 58 ng/L — ABNORMAL HIGH (ref <18)
Troponin I (High Sensitivity): 25 ng/L — ABNORMAL HIGH (ref <18)

## 2022-11-21 LAB — AMMONIA: Ammonia: 25 umol/L (ref 9–35)

## 2022-11-21 LAB — PROTIME-INR
INR: 1.3 — ABNORMAL HIGH (ref 0.8–1.2)
Prothrombin Time: 16.1 s — ABNORMAL HIGH (ref 11.4–15.2)

## 2022-11-21 LAB — CBG MONITORING, ED: Glucose-Capillary: 101 mg/dL — ABNORMAL HIGH (ref 70–99)

## 2022-11-21 LAB — I-STAT CG4 LACTIC ACID, ED
Lactic Acid, Venous: 1 mmol/L (ref 0.5–1.9)
Lactic Acid, Venous: 1 mmol/L (ref 0.5–1.9)

## 2022-11-21 LAB — LIPASE, BLOOD: Lipase: 18 U/L (ref 11–51)

## 2022-11-21 LAB — TECHNOLOGIST SMEAR REVIEW

## 2022-11-21 LAB — BRAIN NATRIURETIC PEPTIDE: B Natriuretic Peptide: 165.1 pg/mL — ABNORMAL HIGH (ref 0.0–100.0)

## 2022-11-21 MED ORDER — PNEUMOCOCCAL 20-VAL CONJ VACC 0.5 ML IM SUSY
0.5000 mL | PREFILLED_SYRINGE | INTRAMUSCULAR | Status: DC
  Filled 2022-11-21: qty 0.5

## 2022-11-21 MED ORDER — HEPARIN (PORCINE) 25000 UT/250ML-% IV SOLN
1750.0000 [IU]/h | INTRAVENOUS | Status: DC
  Administered 2022-11-22: 1750 [IU]/h via INTRAVENOUS
  Administered 2022-11-22: 1450 [IU]/h via INTRAVENOUS
  Filled 2022-11-21 (×2): qty 250

## 2022-11-21 MED ORDER — HEPARIN BOLUS VIA INFUSION
4000.0000 [IU] | Freq: Once | INTRAVENOUS | Status: AC
  Administered 2022-11-21: 4000 [IU] via INTRAVENOUS
  Filled 2022-11-21: qty 4000

## 2022-11-21 MED ORDER — LACTATED RINGERS IV SOLN: INTRAVENOUS | Status: DC

## 2022-11-21 MED ORDER — HEPARIN BOLUS VIA INFUSION
4700.0000 [IU] | Freq: Once | INTRAVENOUS | Status: AC
  Administered 2022-11-21: 4700 [IU] via INTRAVENOUS
  Filled 2022-11-21: qty 4700

## 2022-11-21 MED ORDER — HEPARIN (PORCINE) 25000 UT/250ML-% IV SOLN
1250.0000 [IU]/h | INTRAVENOUS | Status: DC
  Administered 2022-11-21: 1250 [IU]/h via INTRAVENOUS
  Filled 2022-11-21: qty 250

## 2022-11-21 MED ORDER — METHADONE HCL 10 MG PO TABS
80.0000 mg | ORAL_TABLET | Freq: Every day | ORAL | Status: DC
  Administered 2022-11-22 – 2022-12-02 (×11): 80 mg via ORAL
  Filled 2022-11-21 (×11): qty 8

## 2022-11-21 MED ORDER — LACTATED RINGERS IV BOLUS
1000.0000 mL | Freq: Once | INTRAVENOUS | Status: AC
  Administered 2022-11-21: 1000 mL via INTRAVENOUS

## 2022-11-21 MED ORDER — IOHEXOL 350 MG/ML SOLN
75.0000 mL | Freq: Once | INTRAVENOUS | Status: AC | PRN
  Administered 2022-11-21: 75 mL via INTRAVENOUS

## 2022-11-21 MED ORDER — HYDROCHLOROTHIAZIDE 25 MG PO TABS
25.0000 mg | ORAL_TABLET | Freq: Every day | ORAL | Status: DC
  Filled 2022-11-21: qty 1

## 2022-11-21 MED ORDER — LISINOPRIL-HYDROCHLOROTHIAZIDE 20-25 MG PO TABS: 1.0000 | ORAL_TABLET | Freq: Every day | ORAL | Status: DC

## 2022-11-21 MED ORDER — SODIUM CHLORIDE 0.9 % IV SOLN
500.0000 mg | Freq: Once | INTRAVENOUS | Status: AC
  Administered 2022-11-21: 500 mg via INTRAVENOUS
  Filled 2022-11-21: qty 5

## 2022-11-21 MED ORDER — SODIUM CHLORIDE 0.9 % IV SOLN
1.0000 g | Freq: Once | INTRAVENOUS | Status: AC
  Administered 2022-11-21: 1 g via INTRAVENOUS
  Filled 2022-11-21: qty 10

## 2022-11-21 MED ORDER — SODIUM CHLORIDE 0.9 % IV SOLN
1.0000 g | INTRAVENOUS | Status: DC
  Administered 2022-11-22 – 2022-11-24 (×3): 1 g via INTRAVENOUS
  Filled 2022-11-21 (×3): qty 10

## 2022-11-21 MED ORDER — AZITHROMYCIN 250 MG PO TABS
250.0000 mg | ORAL_TABLET | Freq: Every day | ORAL | Status: AC
  Administered 2022-11-22 – 2022-11-25 (×4): 250 mg via ORAL
  Filled 2022-11-21 (×4): qty 1

## 2022-11-21 MED ORDER — LISINOPRIL 20 MG PO TABS
20.0000 mg | ORAL_TABLET | Freq: Every day | ORAL | Status: DC
  Filled 2022-11-21: qty 1

## 2022-11-21 MED ORDER — AMLODIPINE BESYLATE 5 MG PO TABS
5.0000 mg | ORAL_TABLET | Freq: Every day | ORAL | Status: DC
  Administered 2022-11-22 – 2022-11-25 (×4): 5 mg via ORAL
  Filled 2022-11-21 (×4): qty 1

## 2022-11-21 NOTE — Assessment & Plan Note (Addendum)
With respiratory alkalosis. Malignant vs infectious. Given weight loss and indolent course, favor more indolent process such as lung CA or tuberculosis over acute PNA. CT findings of the lung parenchyma are equivocal "nonspecific but may represent multilobar pneumonia/atypical pneumonia with or without superimposed small airway disease." Also has temporal wasting and ?R supraclavicular lymphadenopathy on exam.  Not sure how much of an increased risk the methadone clinic places him at for TB. Elevated WBC is concerning. CHF remains a possibility, though no clinical evidence of volume overload. BNP is pending.  - Place in obs, med-surg - Supplemental O2 PRN - Continue Rocephin and Azitrhomycin for now. Good to have atypical coverage.  - AFB smear and culture, airborne precautions - Thoracentesis with labs  - Need to repeat weight, I doubt he is up 19kg since 8/27. Standing weight ordered - F/u BNP

## 2022-11-21 NOTE — Assessment & Plan Note (Addendum)
Well-controlled. Home dose is 80mg  daily. - Continue home dose

## 2022-11-21 NOTE — Assessment & Plan Note (Addendum)
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  - Thyroid US - TSH

## 2022-11-21 NOTE — Assessment & Plan Note (Addendum)
Cr 1.83, up from baseline 1.4-1.5. Suspect in the setting of poor PO intake related to fatigue/malaise over his indolent illness course.  - Will give 1L bolus now - Repeat AM BMP

## 2022-11-21 NOTE — ED Notes (Signed)
Patient placed onto cardiac monitor

## 2022-11-21 NOTE — H&P (Signed)
Hospital Admission History and Physical Service Pager: [phone]  Patient name: Stephen Lambert Medical record number: [id number] Date of Birth: September 28, 1960 Age: 62 y.o. Gender: male  Primary Care Provider: Alfredo Martinez, MD Consultants: None Code Status: DNR- desires interventions  Preferred Emergency Contact:  Contact Information     Name Relation Home Work Mobile   Stephen Lambert,Stephen Lambert Significant other [phone]  [phone]      Other Contacts   None on File      Chief Complaint: Dyspnea on Exertion  Assessment and Plan: Stephen Lambert is a 62 y.o. male presenting with 3 weeks of progressively worsening DOE, fatigue/malaise, and weight loss. Differential for this patient's presentation of this includes lung cancer, tuberculosis, atypical pneumonia, CHF.  Given indolent course over a few weeks taken together with his weight loss and smoking history, I have the highest suspicion for malignancy, though his prominent leukocytosis with neutrophilic predominance might be more suggestive of infectious process. CHF seems less likely in the absence of clinical volume overload, though R heart failure could be a potential explanation for his otherwise unexplained hepatomegaly.  He will need a thoracentesis to further evaluate the ultimate cause of his pleural effusion.  Assessment & Plan Pleural effusion With respiratory alkalosis. Malignant vs infectious. Given weight loss and indolent course, favor more indolent process such as lung CA or tuberculosis over acute PNA. CT findings of the lung parenchyma are equivocal "nonspecific but may represent multilobar pneumonia/atypical pneumonia with or without superimposed small airway disease." Also has temporal wasting and ?R supraclavicular lymphadenopathy on exam.  Not sure how much of an increased risk the methadone clinic places him at for TB. Elevated WBC is concerning. CHF remains a possibility, though no clinical evidence of volume  overload. BNP is pending.  - Place in obs, med-surg - Supplemental O2 PRN - Continue Rocephin and Azitrhomycin for now. Good to have atypical coverage.  - AFB smear and culture, airborne precautions - Thoracentesis with labs  - Need to repeat weight, I doubt he is up 19kg since 8/27. Standing weight ordered - F/u BNP Acute pulmonary embolism, unspecified pulmonary embolism type, unspecified whether acute cor pulmonale present (HCC) Small LLL pulmonary embolism. No evidence of R heart strain or lung infarction on CT. I doubt this is a significant contributor to his symptoms or his large R effusion. - On heparin gtt, continue for now. Likely can dc on DOAC - This suggests hypercoaguability and lends to the argument for malignant process Thyroid mass 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  - Thyroid US - TSH Methadone use Well-controlled. Home dose is 80mg  daily. - Continue home dose AKI (acute kidney injury) (HCC) Cr 1.83, up from baseline 1.4-1.5. Suspect in the setting of poor PO intake related to fatigue/malaise over his indolent illness course.  - Will give 1L bolus now - Repeat AM BMP Hepatomegaly Moderate-to-severe. Palpable on exam. Hard to tell if tender as I think he has some localized tenderness in the RUQ related to the intrathoracic process, but will treat as tender hepatomegaly. LFTs reassuringly WNL.  - RUQ [place] - Hepatitis panel  Leukocytosis, unspecified type WBC up at 32.0 with neutrophilic predominance.  - I have added on a smear review - Abx and further workup as above - repeat CBC with diff in the morning     Chronic and Stable Conditions: HTN  -  continue home amlodipine 5 and lisinopril-hydrochlorothiazide 20-25 mg daily  FEN/GI: Regular diet VTE Prophylaxis: on heparin gtt   Disposition: Med-Surg  History of Present Illness:  Stephen Lambert is a 62 y.o. male presenting with three weeks  of progressively worsening DOE, weight loss, abdominal fullness/generalized abdominal pain, and fatigue/malaise.   He denies any inciting event, just started noticing it became harder and harder to climb a flight of stairs. He denies any similar symptoms in the past. No known sick contacts.   He is a previous smoker, smoking 1/2 ppd for about 40 years before quitting just two weeks ago. He thinks he has been losing weight. Denies any cough or fevers or other sick symptoms. He tells me his doctor has told him that he has some sort of liver dysfunction in the past but that this yielded nothing. I do not see record of this.   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 He has never used IV drugs.   In the ED, WBC was elevated to 32.0 with neutrophilic predominance. CXR showed near white-out of the right hemithorax and was followed up with CTA Chest and CT Abd/Pelv with contrast. These demonstrated a moderate loculated pleural effusion with thickened and hyperattenuating pleura favored to represent empyema. There was also a small PE in the LLL. He was given Rocephin and Azitrhomycn x1 and started on a heparin gtt. FMTS consulted for admission.   Review Of Systems: Per HPI with the following additions:  Review of Systems  Constitutional:  Positive for malaise/fatigue and weight loss. Negative for fever.  Respiratory:  Positive for shortness of breath. Negative for cough, sputum production and wheezing.   Cardiovascular:  Positive for leg swelling. Negative for chest pain.  Neurological:  Negative for weakness.  Psychiatric/Behavioral:  Negative for substance abuse.   All other systems reviewed and are negative.    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   Pertinent Past Surgical History: Lap appendectomy in 2017 Hernia repair in 1997 Remainder  reviewed in history tab.  Pertinent Social History: Tobacco use: Former, 20 pack year, quit two weeks ago Alcohol use: No Other Substance use: None recently, OUD in remisssion on methadone  Lives with SO Stephen Lambert  Pertinent Family History: Mother with lung CA Remainder reviewed in history tab.   Important Outpatient Medications: Methadone 80mg  daily Remainder reviewed in medication history.   Objective: BP (!) 124/58   Pulse 90   Temp 97.7 F (36.5 C) (Oral)   Resp 13   Ht 6\' 1"  (1.854 m)   Wt 78.5 kg Comment: 09/2022  SpO2 93%   BMI 22.83 kg/m  Exam: General: Chronically ill appearing, temporal wasting  Eyes: Without scleral icterus, EOMs intact  ENTM: Poor dentition, MMM Neck: Without JVD or cervical lymphadenopathy, there is a mass that is palpable deep to the thyroid near the R lateral trachea. Also ?supraclavicular fullness vs lymphadenopathy on the right.  Cardiovascular: Regular rate and rhythm, soft systolic murmur Respiratory: Normal WOB on RA, Absent lung sounds in R base, diminished in R apex. Good air movement on the L.  Gastrointestinal: Diffusely tender, moreso in the upper quadrants. He does have hepatomegaly that seems tender, though he reports that the pain is felt higher up in his thorax than where I am palpating.  MSK: Some non-pitting edema to the BL ankles  Derm: Without rash or excoriation  Neuro: A&O,  normal strength and sensation throughout Psych: Mood and affect are appropriate to the situation  Labs:  CBC BMET  Recent Labs  Lab 11/21/22 0750 11/21/22 0812  WBC 32.0*  --   HGB 10.1* 10.9*  HCT 31.7* 32.0*  PLT 333  --    Recent Labs  Lab 11/21/22 0750 11/21/22 0812  NA 137 138  K 3.9 3.6  CL 97*  --   CO2 24  --   BUN 34*  --   CREATININE 1.83*  --   GLUCOSE 92  --   CALCIUM 8.2*  --     BNP pending  .  EKG: Sinus rhythm, no ST elevation/depression or TWI to suggest ischemia. Normal intervals.    Imaging Studies  Performed:  CXR  IMPRESSION: Large right lung opacity is noted concerning for pneumonia or atelectasis with associated pleural effusion. Underlying mass cannot be excluded.  CTA Chest IMPRESSION: 1. Small volume pulmonary emboli in the left lung lower lobe and inferior lingula. No CT evidence of right heart strain or lung infarction. 2. Moderate-sized loculated right pleural/fissural effusion with thickened and hyperattenuating pleura. Findings are favored to represent empyema. Consider thoracentesis and cytology evaluation to exclude underlying neoplastic process. 3. Patchy ground-glass changes with smooth interlobular septal thickening in the right upper lobe and patchy such areas throughout the left lung. There is also mosaic attenuation in the left lung. Findings are nonspecific but may represent multilobar pneumonia/atypical pneumonia with or without superimposed small airway disease. 4. Indeterminate lesion posterior to the right thyroid lobe. Consider nonemergent thyroid ultrasound. 5. Multiple other nonacute observations, as described above. 6. No discrete metastatic process identified within the abdomen or pelvis.  Independently reviewed and agree with radiologist's interpretation. ?supraclavicular lymph nodes on the R, though view is partially obscured by contrast bolus moving through the subclavian.   Alicia Amel, MD 11/21/2022, 12:47 PM PGY-3, [facility]  FPTS Intern pager: [phone], text pages welcome Secure chat group [facility] Teaching Service

## 2022-11-21 NOTE — Progress Notes (Signed)
FMTS Brief Progress Note  S: Went to the patient's bedside with Dr Royal Piedra. Pt states he is feeling well, has some difficulty breathing with deep inspiration. Asking about timing of thoracentesis. No other questions or concerns.   O: BP (!) 140/72 (BP Location: Left Arm)   Pulse 82   Temp 98.2 F (36.8 C) (Axillary)   Resp 14   Ht 6\' 1"  (1.854 m)   Wt 78.5 kg Comment: 09/2022  SpO2 92%   BMI 22.83 kg/m    Gen: middle aged male laying in bed in NAD CV: RRR, normal S1/S2, no murmurs, rubs, or gallops Pulm: inspiratory crackles at R lung fields, clear on L side, normal WOB on 4L [place] Abd: normoactive bowel sounds, soft, nontender, nondistended Ext: no edema to BLE  A/P: Pleural effusion Malignant vs infectious etiology. BNP elevated at 165.1 -continue supplemental oxygen as needed -continue rochepin and azithromycin  -pending AFB smear and culture, airborne precautions -pending thoracentesis  Acute PE -continue heparin drip -likely tx to DOAC at d/c  Thyroid mass 2 cm right mid thyroid nodule found on [place]. TSH WNL at 0.915. -order FNA biopsy in AM   AKI Cr elevated to 1.83 from baseline of 1.4-1.5. S/p 1L bolus. -AM BMP  Hepatomegaly RUQ [place] found a small mass in L hepatic lobe and 4.7cm cyst in R kidney. LFTs and hepatitis panel WNL. -continue to monitor  - Orders reviewed. Labs for AM ordered- CBC w diff, BMP, heparin level  Lorayne Bender, MD 11/21/2022, 8:35 PM PGY-1, [facility] Night Resident  Please page [phone] with questions.

## 2022-11-21 NOTE — ED Provider Notes (Signed)
[facility] EMERGENCY DEPARTMENT AT [facility] Provider Note   CSN: [id number] Arrival date & time: 11/21/22  2956     History  Chief Complaint  Patient presents with   Abdominal Pain   Shortness of Breath    Stephen Lambert is a 62 y.o. male.  HPI Patient reports that he has been getting problems with shortness of breath, racing heart and pain in his lower chest and epigastrium.  Patient reports symptoms started a few weeks ago.  He thought maybe he had just strained something or was a little sick and would probably get better.  He reports symptoms have persisted and now worsened.  He reports that he will feel like his heart is beating really hard even at rest sometimes.  He reports he gets short of breath with not a lot of activity.  Patient reports he has had a little bit of dry cough.  No productive cough.  No bloody sputum that he is observed.  No fever.  He reports he has had weight loss over about the past 6 months he estimates almost 30 pounds unintentional.  He does note that his urine has been dark in appearance.  He reports that his family doctor was monitoring his liver function tests but has not had other testing or explanation of what the problem might be.  Patient reports very distant alcohol use but not consistent or heavy.  He quit smoking in the past few years but previously smoked about 1/2 pack a day.  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  He denies any history of IV drug abuse.  He reports last use was about 3 months ago.    Home Medications Prior to Admission medications   Medication Sig Start Date End Date Taking? Authorizing Provider  amLODipine (NORVASC) 5 MG tablet TAKE 1 TABLET BY MOUTH EVERYDAY AT BEDTIME 10/28/22  Yes Maxwell, Allee, MD  lisinopril-hydrochlorothiazide (ZESTORETIC) 20-25 MG tablet Take 1 tablet by mouth daily. 11/06/22  Yes Alfredo Martinez, MD  PRESCRIPTION MEDICATION Take 80 mg by mouth in the morning. Methadone   Yes  [provider]      Allergies    Patient has no known allergies.    Review of Systems   Review of Systems  Physical Exam Updated Vital Signs BP (!) 124/58   Pulse 90   Temp 97.7 F (36.5 C) (Oral)   Resp 13   Ht 6\' 1"  (1.854 m)   Wt 78.5 kg Comment: 09/2022  SpO2 93%   BMI 22.83 kg/m  Physical Exam Constitutional:      Comments: Patient is alert and nontoxic.  No respiratory distress at rest.  Thin but good physical condition.  Mental status is clear.  HENT:     Head: Normocephalic and atraumatic.     Mouth/Throat:     Comments: Poor dentition.  Airway is clear. Eyes:     Extraocular Movements: Extraocular movements intact.     Comments: Conjunctive a pale.  Cardiovascular:     Comments: Heart is regular.  2 out of 6 systolic ejection murmur. Pulmonary:     Comments: No respiratory distress at rest.  Speaking in full sentences without difficulty.  Breath sounds throughout the right side are decreased and sound transmitted.  Left breath sounds clear. Abdominal:     Comments: Abdomen is soft.  Mild epigastric and right upper quadrant tenderness without guarding.  No significant distention.  Femoral pulses are bounding and symmetric.  No significant  groin lymphadenopathy.  Musculoskeletal:     Cervical back: Neck supple.     Comments: 2+ symmetric pitting edema of the lower extremities.  Calves are nontender.  Skin of the feet and lower legs is in good condition.  Skin:    General: Skin is warm and dry.     Coloration: Skin is pale.  Neurological:     General: No focal deficit present.     Mental Status: He is oriented to person, place, and time.     Motor: No weakness.     Coordination: Coordination normal.     Comments: Patient's mental status is clear.  Speech is clear.  Movements are coordinated purposeful and symmetric.  Psychiatric:        Mood and Affect: Mood normal.     ED Results / Procedures / Treatments   Labs (all labs ordered are listed, but  only abnormal results are displayed) Labs Reviewed  COMPREHENSIVE METABOLIC PANEL - Abnormal; Notable for the following components:      Result Value   Chloride 97 (*)    BUN 34 (*)    Creatinine, Ser 1.83 (*)    Calcium 8.2 (*)    Total Protein 5.8 (*)    Albumin 1.8 (*)    GFR, Estimated 41 (*)    Anion gap 16 (*)    All other components within normal limits  CBC WITH DIFFERENTIAL/PLATELET - Abnormal; Notable for the following components:   WBC 32.0 (*)    RBC 3.93 (*)    Hemoglobin 10.1 (*)    HCT 31.7 (*)    MCH 25.7 (*)    Neutro Abs 26.4 (*)    Monocytes Absolute 2.8 (*)    Abs Immature Granulocytes 1.20 (*)    All other components within normal limits  PROTIME-INR - Abnormal; Notable for the following components:   Prothrombin Time 16.1 (*)    INR 1.3 (*)    All other components within normal limits  CBG MONITORING, ED - Abnormal; Notable for the following components:   Glucose-Capillary 101 (*)    All other components within normal limits  I-STAT VENOUS BLOOD GAS, ED - Abnormal; Notable for the following components:   pH, Ven 7.479 (*)    pCO2, Ven 37.2 (*)    pO2, Ven 75 (*)    Acid-Base Excess 4.0 (*)    Calcium, Ion 1.04 (*)    HCT 32.0 (*)    Hemoglobin 10.9 (*)    All other components within normal limits  TROPONIN I (HIGH SENSITIVITY) - Abnormal; Notable for the following components:   Troponin I (High Sensitivity) 58 (*)    All other components within normal limits  TROPONIN I (HIGH SENSITIVITY) - Abnormal; Notable for the following components:   Troponin I (High Sensitivity) 25 (*)    All other components within normal limits  CULTURE, BLOOD (ROUTINE X 2)  CULTURE, BLOOD (ROUTINE X 2)  SARS CORONAVIRUS 2 BY RT PCR  MTB-RIF NAA WITH AFB CULTURE, SPUTUM  MTB-RIF NAA WITH AFB CULTURE, SPUTUM  URINALYSIS, ROUTINE W REFLEX MICROSCOPIC  RAPID URINE DRUG SCREEN, HOSP PERFORMED  LIPASE, BLOOD  AMMONIA  BRAIN NATRIURETIC PEPTIDE  HIV ANTIBODY (ROUTINE  TESTING W REFLEX)  TSH  TECHNOLOGIST SMEAR REVIEW  HEPARIN LEVEL (UNFRACTIONATED)  HEPATITIS PANEL, ACUTE  I-STAT CG4 LACTIC ACID, ED  I-STAT CG4 LACTIC ACID, ED    EKG EKG Interpretation Date/Time:  Thursday November 21 2022 07:36:16 EDT Ventricular Rate:  83 PR Interval:  149 QRS Duration:  90 QT Interval:  352 QTC Calculation: 414 R Axis:   55  Text Interpretation: Sinus rhythm Left atrial enlargement no sig change from previous Confirmed by Arby Barrette [phone]) on 11/21/2022 7:38:34 AM  Radiology CT Angio Chest PE W/Cm &/Or Wo Cm  Result Date: 11/21/2022 CLINICAL DATA:  Pulmonary embolism (PE) suspected, high prob; Abdominal pain, acute, nonlocalized. Epigastric and right upper quadrant discomfort in setting of large right infiltrate. Patient reports history of some elevated LFTs * Tracking Code: BO * EXAM: CT ANGIOGRAPHY CHEST CT ABDOMEN AND PELVIS WITH CONTRAST TECHNIQUE: Multidetector CT imaging of the chest was performed using the standard protocol during bolus administration of intravenous contrast. Multiplanar CT image reconstructions and MIPs were obtained to evaluate the vascular anatomy. Multidetector CT imaging of the abdomen and pelvis was performed using the standard protocol during bolus administration of intravenous contrast. RADIATION DOSE REDUCTION: This exam was performed according to the departmental dose-optimization program which includes automated exposure control, adjustment of the mA and/or kV according to patient size and/or use of iterative reconstruction technique. CONTRAST:  75mL OMNIPAQUE IOHEXOL 350 MG/ML SOLN COMPARISON:  CT scan abdomen and pelvis from 11/29/2015. FINDINGS: CTA CHEST FINDINGS Cardiovascular: There are small volume, occlusive and nonocclusive, multiple filling defects in the distal segmental and proximal subsegmental pulmonary artery branches to left lung lower lobe and inferior lingula. No CT evidence of right heart strain. No lung  infarction. Normal cardiac size. Mild pericardial effusion. No aortic aneurysm. 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 Mediastinum/Nodes: There is a slightly hyperattenuating (in comparison to the skeletal muscles but hypoattenuating, in comparison to the thyroid) well-circumscribed 1.7 x 2.6 cm lesion posterior to the right thyroid lobe. This is incompletely characterized on the current examination but meets the size criteria for evaluation with nonemergent thyroid ultrasound. No solid / cystic mediastinal masses. The esophagus is nondistended precluding optimal assessment. There are few mildly prominent mediastinal lymph nodes, which do not meet the size criteria for lymphadenopathy. No axillary or hilar lymphadenopathy by size criteria. Lungs/Pleura: The central tracheo-bronchial tree is patent. There is at least moderate sized loculated fluid in the right hemithorax. There are multiple hypoattenuating areas within the right lower hemithorax which are near the pleural surface and favored to represent loculated pleural effusion with thick pleura, favoring empyema. There are also relatively homogeneously opacified atelectatic areas in the middle lobe and right lower lobe. Evaluation for subtle focal lung parenchymal mass is limited on this exam. There are ground-glass changes with associated smooth interlobular septal thickening mainly in the posterior segment of right upper lobe and patchy such areas throughout the left lung. There is mosaic attenuation of left lung, favoring heterogeneous air trapping related to small airways disease. There is also irregular pleural thickening in the right inferior hemithorax, favoring empyema versus pleural metastases. Consider image guided thoracentesis and cytology evaluation. Musculoskeletal: The visualized soft tissues of the chest wall are grossly unremarkable. No suspicious osseous lesions. Review of the MIP images confirms  the above findings. CT ABDOMEN and PELVIS FINDINGS Hepatobiliary: Moderate-to-severe early enlarged liver measuring up to 22.4 cm in length. Non-cirrhotic configuration. No suspicious mass. There is a 5 mm hypoattenuating focus in the left hepatic dome, which is too small to adequately characterize no intrahepatic or extrahepatic bile duct dilation. No calcified gallstones. Normal gallbladder wall thickness. No pericholecystic inflammatory changes. Pancreas: Unremarkable. No pancreatic ductal dilatation or surrounding inflammatory changes. Spleen: Within normal limits. No focal lesion.  Adrenals/Urinary Tract: There is diffuse thickening of bilateral adrenal glands, without discrete nodule. Findings are nonspecific but stable since the prior study and mostly associated with adrenal hyperplasia. No suspicious renal mass. Multiple bilateral simple renal cysts noted with largest in the right kidney upper pole measuring up to 3.4 x 4.8 cm. No hydronephrosis. No renal or ureteric calculi. Unremarkable urinary bladder. Stomach/Bowel: No disproportionate dilation of the small or large bowel loops. No evidence of abnormal bowel wall thickening or inflammatory changes. The appendix was not visualized; however there is no acute inflammatory process in the right lower quadrant. Vascular/Lymphatic: No ascites or pneumoperitoneum. No abdominal or pelvic lymphadenopathy, by size criteria. No aneurysmal dilation of the major abdominal arteries. There are mild peripheral atherosclerotic vascular calcifications of the aorta and its major branches. Reproductive: Normal size prostate. Symmetric seminal vesicles. Other: The visualized soft tissues and abdominal wall are unremarkable. Musculoskeletal: No suspicious osseous lesions. There are mild multilevel degenerative changes in the visualized spine. Review of the MIP images confirms the above findings. IMPRESSION: 1. Small volume pulmonary emboli in the left lung lower lobe and  inferior lingula. No CT evidence of right heart strain or lung infarction. 2. Moderate-sized loculated right pleural/fissural effusion with thickened and hyperattenuating pleura. Findings are favored to represent empyema. Consider thoracentesis and cytology evaluation to exclude underlying neoplastic process. 3. Patchy ground-glass changes with smooth interlobular septal thickening in the right upper lobe and patchy such areas throughout the left lung. There is also mosaic attenuation in the left lung. Findings are nonspecific but may represent multilobar pneumonia/atypical pneumonia with or without superimposed small airway disease. 4. Indeterminate lesion posterior to the right thyroid lobe. Consider nonemergent thyroid ultrasound. 5. Multiple other nonacute observations, as described above. 6. No discrete metastatic process identified within the abdomen or pelvis. Critical Value/emergent results were called by telephone at the time of interpretation on 11/21/2022 at 11:22 am to provider [facility] , who verbally acknowledged these results. Electronically Signed   By: Jules Schick M.D.   On: 11/21/2022 11:35   CT ABDOMEN PELVIS W CONTRAST  Result Date: 11/21/2022 CLINICAL DATA:  Pulmonary embolism (PE) suspected, high prob; Abdominal pain, acute, nonlocalized. Epigastric and right upper quadrant discomfort in setting of large right infiltrate. Patient reports history of some elevated LFTs * Tracking Code: BO * EXAM: CT ANGIOGRAPHY CHEST CT ABDOMEN AND PELVIS WITH CONTRAST TECHNIQUE: Multidetector CT imaging of the chest was performed using the standard protocol during bolus administration of intravenous contrast. Multiplanar CT image reconstructions and MIPs were obtained to evaluate the vascular anatomy. Multidetector CT imaging of the abdomen and pelvis was performed using the standard protocol during bolus administration of intravenous contrast. RADIATION DOSE REDUCTION: This exam was performed  according to the departmental dose-optimization program which includes automated exposure control, adjustment of the mA and/or kV according to patient size and/or use of iterative reconstruction technique. CONTRAST:  75mL OMNIPAQUE IOHEXOL 350 MG/ML SOLN COMPARISON:  CT scan abdomen and pelvis from 11/29/2015. FINDINGS: CTA CHEST FINDINGS Cardiovascular: There are small volume, occlusive and nonocclusive, multiple filling defects in the distal segmental and proximal subsegmental pulmonary artery branches to left lung lower lobe and inferior lingula. No CT evidence of right heart strain. No lung infarction. Normal cardiac size. Mild pericardial effusion. No aortic aneurysm. 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 Mediastinum/Nodes: There is a slightly hyperattenuating (in comparison to the skeletal muscles but hypoattenuating, in comparison to the thyroid) well-circumscribed 1.7 x 2.6  cm lesion posterior to the right thyroid lobe. This is incompletely characterized on the current examination but meets the size criteria for evaluation with nonemergent thyroid ultrasound. No solid / cystic mediastinal masses. The esophagus is nondistended precluding optimal assessment. There are few mildly prominent mediastinal lymph nodes, which do not meet the size criteria for lymphadenopathy. No axillary or hilar lymphadenopathy by size criteria. Lungs/Pleura: The central tracheo-bronchial tree is patent. There is at least moderate sized loculated fluid in the right hemithorax. There are multiple hypoattenuating areas within the right lower hemithorax which are near the pleural surface and favored to represent loculated pleural effusion with thick pleura, favoring empyema. There are also relatively homogeneously opacified atelectatic areas in the middle lobe and right lower lobe. Evaluation for subtle focal lung parenchymal mass is limited on this exam. There are ground-glass changes  with associated smooth interlobular septal thickening mainly in the posterior segment of right upper lobe and patchy such areas throughout the left lung. There is mosaic attenuation of left lung, favoring heterogeneous air trapping related to small airways disease. There is also irregular pleural thickening in the right inferior hemithorax, favoring empyema versus pleural metastases. Consider image guided thoracentesis and cytology evaluation. Musculoskeletal: The visualized soft tissues of the chest wall are grossly unremarkable. No suspicious osseous lesions. Review of the MIP images confirms the above findings. CT ABDOMEN and PELVIS FINDINGS Hepatobiliary: Moderate-to-severe early enlarged liver measuring up to 22.4 cm in length. Non-cirrhotic configuration. No suspicious mass. There is a 5 mm hypoattenuating focus in the left hepatic dome, which is too small to adequately characterize no intrahepatic or extrahepatic bile duct dilation. No calcified gallstones. Normal gallbladder wall thickness. No pericholecystic inflammatory changes. Pancreas: Unremarkable. No pancreatic ductal dilatation or surrounding inflammatory changes. Spleen: Within normal limits. No focal lesion. Adrenals/Urinary Tract: There is diffuse thickening of bilateral adrenal glands, without discrete nodule. Findings are nonspecific but stable since the prior study and mostly associated with adrenal hyperplasia. No suspicious renal mass. Multiple bilateral simple renal cysts noted with largest in the right kidney upper pole measuring up to 3.4 x 4.8 cm. No hydronephrosis. No renal or ureteric calculi. Unremarkable urinary bladder. Stomach/Bowel: No disproportionate dilation of the small or large bowel loops. No evidence of abnormal bowel wall thickening or inflammatory changes. The appendix was not visualized; however there is no acute inflammatory process in the right lower quadrant. Vascular/Lymphatic: No ascites or pneumoperitoneum. No  abdominal or pelvic lymphadenopathy, by size criteria. No aneurysmal dilation of the major abdominal arteries. There are mild peripheral atherosclerotic vascular calcifications of the aorta and its major branches. Reproductive: Normal size prostate. Symmetric seminal vesicles. Other: The visualized soft tissues and abdominal wall are unremarkable. Musculoskeletal: No suspicious osseous lesions. There are mild multilevel degenerative changes in the visualized spine. Review of the MIP images confirms the above findings. IMPRESSION: 1. Small volume pulmonary emboli in the left lung lower lobe and inferior lingula. No CT evidence of right heart strain or lung infarction. 2. Moderate-sized loculated right pleural/fissural effusion with thickened and hyperattenuating pleura. Findings are favored to represent empyema. Consider thoracentesis and cytology evaluation to exclude underlying neoplastic process. 3. Patchy ground-glass changes with smooth interlobular septal thickening in the right upper lobe and patchy such areas throughout the left lung. There is also mosaic attenuation in the left lung. Findings are nonspecific but may represent multilobar pneumonia/atypical pneumonia with or without superimposed small airway disease. 4. Indeterminate lesion posterior to the right thyroid lobe. Consider nonemergent thyroid ultrasound. 5. Multiple  other nonacute observations, as described above. 6. No discrete metastatic process identified within the abdomen or pelvis. Critical Value/emergent results were called by telephone at the time of interpretation on 11/21/2022 at 11:22 am to provider [facility] , who verbally acknowledged these results. Electronically Signed   By: Jules Schick M.D.   On: 11/21/2022 11:35   DG Chest Port 1 View  Result Date: 11/21/2022 CLINICAL DATA:  Shortness of breath. EXAM: PORTABLE CHEST 1 VIEW COMPARISON:  November 29, 2015. FINDINGS: The heart size and mediastinal contours are within normal  limits. Left lung is clear. Large right lung opacity is noted concerning for pneumonia or atelectasis with associated pleural effusion. The visualized skeletal structures are unremarkable. IMPRESSION: Large right lung opacity is noted concerning for pneumonia or atelectasis with associated pleural effusion. Underlying mass cannot be excluded. Electronically Signed   By: Lupita Raider M.D.   On: 11/21/2022 08:37    Procedures Procedures   CRITICAL CARE Performed by: Arby Barrette   Total critical care time: 30 minutes  Critical care time was exclusive of separately billable procedures and treating other patients.  Critical care was necessary to treat or prevent imminent or life-threatening deterioration.  Critical care was time spent personally by me on the following activities: development of treatment plan with patient and/or surrogate as well as nursing, discussions with consultants, evaluation of patient's response to treatment, examination of patient, obtaining history from patient or surrogate, ordering and performing treatments and interventions, ordering and review of laboratory studies, ordering and review of radiographic studies, pulse oximetry and re-evaluation of patient's condition.  Medications Ordered in ED Medications  heparin bolus via infusion 4,700 Units (has no administration in time range)  heparin ADULT infusion 100 units/mL (25000 units/249mL) (has no administration in time range)  lisinopril-hydrochlorothiazide (ZESTORETIC) 20-25 MG per tablet 1 tablet (has no administration in time range)  amLODipine (NORVASC) tablet 5 mg (has no administration in time range)  methadone (DOLOPHINE) tablet 80 mg (has no administration in time range)  azithromycin (ZITHROMAX) tablet 250 mg (has no administration in time range)  cefTRIAXone (ROCEPHIN) 1 g in sodium chloride 0.9 % 100 mL IVPB (has no administration in time range)  lactated ringers bolus 1,000 mL (has no administration  in time range)  cefTRIAXone (ROCEPHIN) 1 g in sodium chloride 0.9 % 100 mL IVPB (0 g Intravenous Stopped 11/21/22 1055)  azithromycin (ZITHROMAX) 500 mg in sodium chloride 0.9 % 250 mL IVPB (0 mg Intravenous Stopped 11/21/22 1049)  iohexol (OMNIPAQUE) 350 MG/ML injection 75 mL (75 mLs Intravenous Contrast Given 11/21/22 1016)    ED Course/ Medical Decision Making/ A&P                                 Medical Decision Making Amount and/or Complexity of Data Reviewed Labs: ordered. Radiology: ordered.  Risk Prescription drug management. Decision regarding hospitalization.   Patient presents as outlined with about 2 to 3 weeks of indolent symptoms of increasing shortness of breath, palpitations and chest pain.  Patient indicates pain that is in the epigastrium and slightly to the left lower chest centrally.  At rest patient's heart rate is in the high 90s.  He describes palpitations and racing heart with any exertion.  At this time he is not dyspneic in appearance at rest.  Physical exam however shows asymmetric breath sounds.  Differential diagnosis includes CHF\pneumonia\PE\pneumothorax\pleural effusion\malignancy\pericardial effusion\ACS.  Will proceed with diagnostic evaluation.  This time patient is not requiring supplemental oxygen.  Will continue to observe closely.  EKG reviewed by myself no acute ischemic appearance.  No significant change from previous.  Chest x-ray reviewed at bedside by myself shows extensive opacification and infiltrate of nearly all of the right lung field.  Left is grossly clear.   White count 32 H&H 10.1 and 31.7.  Platelets 333.  Comprehensive metabolic panel GFR 41 anion gap 16 BUN 34 creatinine 1.8 potassium 3.9.  VBG pH 7.4 pCO2 37.  Rapid drug screen negative.  Troponin 25.  Lactic acid 1.  Urinalysis negative.  Ammonia 25.  Given extent city of infiltrate and possibility of associated PE will also get CT scan.  CT PE study interpreted by radiology does  show small PE on the left but extensive changes on the right possibly abscess\empyema vs pleural effusion versus metastatic disease.  Rocephin and Zithromax started.  Will plan for admission.  Patient is seen by Dr. Leonides Sake and family practice medical teaching service  Consult: Family practice teaching service for admission         Final Clinical Impression(s) / ED Diagnoses Final diagnoses:  Pleural effusion  Acute pulmonary embolism, unspecified pulmonary embolism type, unspecified whether acute cor pulmonale present (HCC)  Thyroid mass  Methadone use  AKI (acute kidney injury) (HCC)  Hepatomegaly  Pneumonia of right lung due to infectious organism, unspecified part of lung  Single subsegmental pulmonary embolism without acute cor [place] (HCC)    Rx / DC Orders ED Discharge Orders     None         Arby Barrette, MD 11/21/22 1256

## 2022-11-21 NOTE — ED Notes (Signed)
ED TO INPATIENT HANDOFF REPORT  ED Nurse Name and Phone #: Kiondra Caicedo/maggie [phone]  S Name/Age/Gender Stephen Lambert 62 y.o. male Room/Bed: 017C/017C  Code Status   Code Status: Do not attempt resuscitation (DNR) PRE-ARREST INTERVENTIONS DESIRED  Home/SNF/Other Home Patient oriented to: self, place, time, and situation Is this baseline? Yes   Triage Complete: Triage complete  Chief Complaint Pleural effusion [J90]  Triage Note No notes on file   Allergies No Known Allergies  Level of Care/Admitting Diagnosis ED Disposition     ED Disposition  Admit   Condition  --   Comment  Hospital Area: [facility] [[id number]]  Level of Care: Med-Surg [16]  May place patient in observation at [facility] or [facility] if equivalent level of care is available:: No  Covid Evaluation: Symptomatic Person Under Investigation (PUI) or recent exposure (last 10 days) *Testing Required*  Diagnosis: Pleural effusion [242230]  Admitting Physician: Alicia Amel [[id number]]  Attending Physician: Caro Laroche [[id number]]          B Medical/Surgery History Past Medical History:  Diagnosis Date   Back pain, chronic    2007 fell off truck   Hernia    Rt. Groin - repaired 1997   HTN (hypertension)    Past Surgical History:  Procedure Laterality Date   HERNIA REPAIR     1997   LAPAROSCOPIC APPENDECTOMY N/A 11/29/2015   Procedure: APPENDECTOMY LAPAROSCOPIC;  Surgeon: Gaynelle Adu, MD;  Location: [facility] OR;  Service: General;  Laterality: N/A;     A IV Location/Drains/Wounds Patient Lines/Drains/Airways Status     Active Line/Drains/Airways     Name Placement date Placement time Site Days   Peripheral IV 11/21/22 20 G Right Antecubital 11/21/22  0754  Antecubital  less than 1   Peripheral IV 11/21/22 22 G Left;Posterior Hand 11/21/22  0952  Hand  less than 1            Intake/Output Last 24 hours No intake or output data in the 24 hours ending 11/21/22  1231  Labs/Imaging Results for orders placed or performed during the hospital encounter of 11/21/22 (from the past 48 hour(s))  Comprehensive metabolic panel     Status: Abnormal   Collection Time: 11/21/22  7:50 AM  Result Value Ref Range   Sodium 137 135 - 145 mmol/L   Potassium 3.9 3.5 - 5.1 mmol/L   Chloride 97 (L) 98 - 111 mmol/L   CO2 24 22 - 32 mmol/L   Glucose, Bld 92 70 - 99 mg/dL    Comment: Glucose reference range applies only to samples taken after fasting for at least 8 hours.   BUN 34 (H) 8 - 23 mg/dL   Creatinine, Ser 9.56 (H) 0.61 - 1.24 mg/dL   Calcium 8.2 (L) 8.9 - 10.3 mg/dL   Total Protein 5.8 (L) 6.5 - 8.1 g/dL   Albumin 1.8 (L) 3.5 - 5.0 g/dL   AST 21 15 - 41 U/L   ALT 18 0 - 44 U/L   Alkaline Phosphatase 108 38 - 126 U/L   Total Bilirubin 0.7 0.3 - 1.2 mg/dL   GFR, Estimated 41 (L) >60 mL/min    Comment: (NOTE) Calculated using the CKD-EPI Creatinine Equation (2021)    Anion gap 16 (H) 5 - 15    Comment: Performed at [facility], [address], [place], [place] [address]  Lipase, blood     Status: None   Collection Time: 11/21/22  7:50  AM  Result Value Ref Range   Lipase 18 11 - 51 U/L    Comment: Performed at [facility], [address] [place], [place] [address]  Troponin I (High Sensitivity)     Status: Abnormal   Collection Time: 11/21/22  7:50 AM  Result Value Ref Range   Troponin I (High Sensitivity) 58 (H) <18 ng/L    Comment: (NOTE) Elevated high sensitivity troponin I (hsTnI) values and significant  changes across serial measurements may suggest ACS but many other  chronic and acute conditions are known to elevate hsTnI results.  Refer to the "Links" section for chest pain algorithms and additional  guidance. Performed at [facility], [address], [place], [place] [address]   CBC with Differential     Status: Abnormal   Collection Time: 11/21/22  7:50 AM  Result Value Ref Range   WBC 32.0 (H) 4.0 - 10.5 K/uL    RBC 3.93 (L) 4.22 - 5.81 MIL/uL   Hemoglobin 10.1 (L) 13.0 - 17.0 g/dL   HCT 09.8 (L) 11.9 - 14.7 %   MCV 80.7 80.0 - 100.0 fL   MCH 25.7 (L) 26.0 - 34.0 pg   MCHC 31.9 30.0 - 36.0 g/dL   RDW 82.9 56.2 - 13.0 %   Platelets 333 150 - 400 K/uL    Comment: REPEATED TO VERIFY   nRBC 0.0 0.0 - 0.2 %   Neutrophils Relative % 82 %   Neutro Abs 26.4 (H) 1.7 - 7.7 K/uL   Lymphocytes Relative 4 %   Lymphs Abs 1.2 0.7 - 4.0 K/uL   Monocytes Relative 9 %   Monocytes Absolute 2.8 (H) 0.1 - 1.0 K/uL   Eosinophils Relative 1 %   Eosinophils Absolute 0.3 0.0 - 0.5 K/uL   Basophils Relative 0 %   Basophils Absolute 0.1 0.0 - 0.1 K/uL   WBC Morphology MORPHOLOGY UNREMARKABLE    RBC Morphology MORPHOLOGY UNREMARKABLE    Smear Review MORPHOLOGY UNREMARKABLE    Immature Granulocytes 4 %   Abs Immature Granulocytes 1.20 (H) 0.00 - 0.07 K/uL    Comment: Performed at [facility], [address] [place], [place] [address]  Protime-INR     Status: Abnormal   Collection Time: 11/21/22  7:50 AM  Result Value Ref Range   Prothrombin Time 16.1 (H) 11.4 - 15.2 seconds   INR 1.3 (H) 0.8 - 1.2    Comment: (NOTE) INR goal varies based on device and disease states. Performed at [facility], [address], [place], [place] [address]   Ammonia     Status: None   Collection Time: 11/21/22  7:51 AM  Result Value Ref Range   Ammonia 25 9 - 35 umol/L    Comment: Performed at [facility], [address], [place], [place] [address]  CBG monitoring, ED     Status: Abnormal   Collection Time: 11/21/22  8:04 AM  Result Value Ref Range   Glucose-Capillary 101 (H) 70 - 99 mg/dL    Comment: Glucose reference range applies only to samples taken after fasting for at least 8 hours.  I-Stat venous blood gas, ED     Status: Abnormal   Collection Time: 11/21/22  8:12 AM  Result Value Ref Range   pH, Ven 7.479 (H) 7.25 - 7.43   pCO2, Ven 37.2 (L) 44 - 60 mmHg   pO2, Ven 75 (H) 32 - 45 mmHg    Bicarbonate  27.7 20.0 - 28.0 mmol/L   TCO2 29 22 - 32 mmol/L   O2 Saturation 96 %   Acid-Base Excess 4.0 (H) 0.0 - 2.0 mmol/L   Sodium 138 135 - 145 mmol/L   Potassium 3.6 3.5 - 5.1 mmol/L   Calcium, Ion 1.04 (L) 1.15 - 1.40 mmol/L   HCT 32.0 (L) 39.0 - 52.0 %   Hemoglobin 10.9 (L) 13.0 - 17.0 g/dL   Sample type VENOUS   Troponin I (High Sensitivity)     Status: Abnormal   Collection Time: 11/21/22  9:50 AM  Result Value Ref Range   Troponin I (High Sensitivity) 25 (H) <18 ng/L    Comment: (NOTE) Elevated high sensitivity troponin I (hsTnI) values and significant  changes across serial measurements may suggest ACS but many other  chronic and acute conditions are known to elevate hsTnI results.  Refer to the "Links" section for chest pain algorithms and additional  guidance. Performed at [facility], [address] [place], [place] [address]   I-Stat Lactic Acid     Status: None   Collection Time: 11/21/22  9:52 AM  Result Value Ref Range   Lactic Acid, Venous 1.0 0.5 - 1.9 mmol/L  Urinalysis, Routine w reflex microscopic -Urine, Clean Catch     Status: None   Collection Time: 11/21/22 11:04 AM  Result Value Ref Range   Color, Urine YELLOW YELLOW   APPearance CLEAR CLEAR   Specific Gravity, Urine 1.026 1.005 - 1.030   pH 6.0 5.0 - 8.0   Glucose, UA NEGATIVE NEGATIVE mg/dL   Hgb urine dipstick NEGATIVE NEGATIVE   Bilirubin Urine NEGATIVE NEGATIVE   Ketones, ur NEGATIVE NEGATIVE mg/dL   Protein, ur NEGATIVE NEGATIVE mg/dL   Nitrite NEGATIVE NEGATIVE   Leukocytes,Ua NEGATIVE NEGATIVE    Comment: Performed at [facility], [address], [place], [place] [address]  Rapid urine drug screen (hospital performed)     Status: None   Collection Time: 11/21/22 11:04 AM  Result Value Ref Range   Opiates NONE DETECTED NONE DETECTED   Cocaine NONE DETECTED NONE DETECTED   Benzodiazepines NONE DETECTED NONE DETECTED   Amphetamines NONE DETECTED NONE DETECTED    Tetrahydrocannabinol NONE DETECTED NONE DETECTED   Barbiturates NONE DETECTED NONE DETECTED    Comment: (NOTE) DRUG SCREEN FOR MEDICAL PURPOSES ONLY.  IF CONFIRMATION IS NEEDED FOR ANY PURPOSE, NOTIFY LAB WITHIN 5 DAYS.  LOWEST DETECTABLE LIMITS FOR URINE DRUG SCREEN Drug Class                     Cutoff (ng/mL) Amphetamine and metabolites    1000 Barbiturate and metabolites    200 Benzodiazepine                 200 Opiates and metabolites        300 Cocaine and metabolites        300 THC                            50 Performed at [facility], [address] [place], [place] [address]   I-Stat Lactic Acid     Status: None   Collection Time: 11/21/22 11:48 AM  Result Value Ref Range   Lactic Acid, Venous 1.0 0.5 - 1.9 mmol/L   CT Angio Chest PE W/Cm &/Or Wo Cm  Result Date: 11/21/2022 CLINICAL DATA:  Pulmonary embolism (PE) suspected, high  prob; Abdominal pain, acute, nonlocalized. Epigastric and right upper quadrant discomfort in setting of large right infiltrate. Patient reports history of some elevated LFTs * Tracking Code: BO * EXAM: CT ANGIOGRAPHY CHEST CT ABDOMEN AND PELVIS WITH CONTRAST TECHNIQUE: Multidetector CT imaging of the chest was performed using the standard protocol during bolus administration of intravenous contrast. Multiplanar CT image reconstructions and MIPs were obtained to evaluate the vascular anatomy. Multidetector CT imaging of the abdomen and pelvis was performed using the standard protocol during bolus administration of intravenous contrast. RADIATION DOSE REDUCTION: This exam was performed according to the departmental dose-optimization program which includes automated exposure control, adjustment of the mA and/or kV according to patient size and/or use of iterative reconstruction technique. CONTRAST:  75mL OMNIPAQUE IOHEXOL 350 MG/ML SOLN COMPARISON:  CT scan abdomen and pelvis from 11/29/2015. FINDINGS: CTA CHEST FINDINGS Cardiovascular: There are  small volume, occlusive and nonocclusive, multiple filling defects in the distal segmental and proximal subsegmental pulmonary artery branches to left lung lower lobe and inferior lingula. No CT evidence of right heart strain. No lung infarction. Normal cardiac size. Mild pericardial effusion. No aortic aneurysm. 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 Mediastinum/Nodes: There is a slightly hyperattenuating (in comparison to the skeletal muscles but hypoattenuating, in comparison to the thyroid) well-circumscribed 1.7 x 2.6 cm lesion posterior to the right thyroid lobe. This is incompletely characterized on the current examination but meets the size criteria for evaluation with nonemergent thyroid ultrasound. No solid / cystic mediastinal masses. The esophagus is nondistended precluding optimal assessment. There are few mildly prominent mediastinal lymph nodes, which do not meet the size criteria for lymphadenopathy. No axillary or hilar lymphadenopathy by size criteria. Lungs/Pleura: The central tracheo-bronchial tree is patent. There is at least moderate sized loculated fluid in the right hemithorax. There are multiple hypoattenuating areas within the right lower hemithorax which are near the pleural surface and favored to represent loculated pleural effusion with thick pleura, favoring empyema. There are also relatively homogeneously opacified atelectatic areas in the middle lobe and right lower lobe. Evaluation for subtle focal lung parenchymal mass is limited on this exam. There are ground-glass changes with associated smooth interlobular septal thickening mainly in the posterior segment of right upper lobe and patchy such areas throughout the left lung. There is mosaic attenuation of left lung, favoring heterogeneous air trapping related to small airways disease. There is also irregular pleural thickening in the right inferior hemithorax, favoring empyema versus  pleural metastases. Consider image guided thoracentesis and cytology evaluation. Musculoskeletal: The visualized soft tissues of the chest wall are grossly unremarkable. No suspicious osseous lesions. Review of the MIP images confirms the above findings. CT ABDOMEN and PELVIS FINDINGS Hepatobiliary: Moderate-to-severe early enlarged liver measuring up to 22.4 cm in length. Non-cirrhotic configuration. No suspicious mass. There is a 5 mm hypoattenuating focus in the left hepatic dome, which is too small to adequately characterize no intrahepatic or extrahepatic bile duct dilation. No calcified gallstones. Normal gallbladder wall thickness. No pericholecystic inflammatory changes. Pancreas: Unremarkable. No pancreatic ductal dilatation or surrounding inflammatory changes. Spleen: Within normal limits. No focal lesion. Adrenals/Urinary Tract: There is diffuse thickening of bilateral adrenal glands, without discrete nodule. Findings are nonspecific but stable since the prior study and mostly associated with adrenal hyperplasia. No suspicious renal mass. Multiple bilateral simple renal cysts noted with largest in the right kidney upper pole measuring up to 3.4 x 4.8 cm. No hydronephrosis. No renal or ureteric calculi. Unremarkable  urinary bladder. Stomach/Bowel: No disproportionate dilation of the small or large bowel loops. No evidence of abnormal bowel wall thickening or inflammatory changes. The appendix was not visualized; however there is no acute inflammatory process in the right lower quadrant. Vascular/Lymphatic: No ascites or pneumoperitoneum. No abdominal or pelvic lymphadenopathy, by size criteria. No aneurysmal dilation of the major abdominal arteries. There are mild peripheral atherosclerotic vascular calcifications of the aorta and its major branches. Reproductive: Normal size prostate. Symmetric seminal vesicles. Other: The visualized soft tissues and abdominal wall are unremarkable. Musculoskeletal: No  suspicious osseous lesions. There are mild multilevel degenerative changes in the visualized spine. Review of the MIP images confirms the above findings. IMPRESSION: 1. Small volume pulmonary emboli in the left lung lower lobe and inferior lingula. No CT evidence of right heart strain or lung infarction. 2. Moderate-sized loculated right pleural/fissural effusion with thickened and hyperattenuating pleura. Findings are favored to represent empyema. Consider thoracentesis and cytology evaluation to exclude underlying neoplastic process. 3. Patchy ground-glass changes with smooth interlobular septal thickening in the right upper lobe and patchy such areas throughout the left lung. There is also mosaic attenuation in the left lung. Findings are nonspecific but may represent multilobar pneumonia/atypical pneumonia with or without superimposed small airway disease. 4. Indeterminate lesion posterior to the right thyroid lobe. Consider nonemergent thyroid ultrasound. 5. Multiple other nonacute observations, as described above. 6. No discrete metastatic process identified within the abdomen or pelvis. Critical Value/emergent results were called by telephone at the time of interpretation on 11/21/2022 at 11:22 am to provider [facility] , who verbally acknowledged these results. Electronically Signed   By: Jules Schick M.D.   On: 11/21/2022 11:35   CT ABDOMEN PELVIS W CONTRAST  Result Date: 11/21/2022 CLINICAL DATA:  Pulmonary embolism (PE) suspected, high prob; Abdominal pain, acute, nonlocalized. Epigastric and right upper quadrant discomfort in setting of large right infiltrate. Patient reports history of some elevated LFTs * Tracking Code: BO * EXAM: CT ANGIOGRAPHY CHEST CT ABDOMEN AND PELVIS WITH CONTRAST TECHNIQUE: Multidetector CT imaging of the chest was performed using the standard protocol during bolus administration of intravenous contrast. Multiplanar CT image reconstructions and MIPs were obtained to  evaluate the vascular anatomy. Multidetector CT imaging of the abdomen and pelvis was performed using the standard protocol during bolus administration of intravenous contrast. RADIATION DOSE REDUCTION: This exam was performed according to the departmental dose-optimization program which includes automated exposure control, adjustment of the mA and/or kV according to patient size and/or use of iterative reconstruction technique. CONTRAST:  75mL OMNIPAQUE IOHEXOL 350 MG/ML SOLN COMPARISON:  CT scan abdomen and pelvis from 11/29/2015. FINDINGS: CTA CHEST FINDINGS Cardiovascular: There are small volume, occlusive and nonocclusive, multiple filling defects in the distal segmental and proximal subsegmental pulmonary artery branches to left lung lower lobe and inferior lingula. No CT evidence of right heart strain. No lung infarction. Normal cardiac size. Mild pericardial effusion. No aortic aneurysm. 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 Mediastinum/Nodes: There is a slightly hyperattenuating (in comparison to the skeletal muscles but hypoattenuating, in comparison to the thyroid) well-circumscribed 1.7 x 2.6 cm lesion posterior to the right thyroid lobe. This is incompletely characterized on the current examination but meets the size criteria for evaluation with nonemergent thyroid ultrasound. No solid / cystic mediastinal masses. The esophagus is nondistended precluding optimal assessment. There are few mildly prominent mediastinal lymph nodes, which do not meet the size criteria for lymphadenopathy. No axillary or hilar  lymphadenopathy by size criteria. Lungs/Pleura: The central tracheo-bronchial tree is patent. There is at least moderate sized loculated fluid in the right hemithorax. There are multiple hypoattenuating areas within the right lower hemithorax which are near the pleural surface and favored to represent loculated pleural effusion with thick pleura, favoring  empyema. There are also relatively homogeneously opacified atelectatic areas in the middle lobe and right lower lobe. Evaluation for subtle focal lung parenchymal mass is limited on this exam. There are ground-glass changes with associated smooth interlobular septal thickening mainly in the posterior segment of right upper lobe and patchy such areas throughout the left lung. There is mosaic attenuation of left lung, favoring heterogeneous air trapping related to small airways disease. There is also irregular pleural thickening in the right inferior hemithorax, favoring empyema versus pleural metastases. Consider image guided thoracentesis and cytology evaluation. Musculoskeletal: The visualized soft tissues of the chest wall are grossly unremarkable. No suspicious osseous lesions. Review of the MIP images confirms the above findings. CT ABDOMEN and PELVIS FINDINGS Hepatobiliary: Moderate-to-severe early enlarged liver measuring up to 22.4 cm in length. Non-cirrhotic configuration. No suspicious mass. There is a 5 mm hypoattenuating focus in the left hepatic dome, which is too small to adequately characterize no intrahepatic or extrahepatic bile duct dilation. No calcified gallstones. Normal gallbladder wall thickness. No pericholecystic inflammatory changes. Pancreas: Unremarkable. No pancreatic ductal dilatation or surrounding inflammatory changes. Spleen: Within normal limits. No focal lesion. Adrenals/Urinary Tract: There is diffuse thickening of bilateral adrenal glands, without discrete nodule. Findings are nonspecific but stable since the prior study and mostly associated with adrenal hyperplasia. No suspicious renal mass. Multiple bilateral simple renal cysts noted with largest in the right kidney upper pole measuring up to 3.4 x 4.8 cm. No hydronephrosis. No renal or ureteric calculi. Unremarkable urinary bladder. Stomach/Bowel: No disproportionate dilation of the small or large bowel loops. No evidence of  abnormal bowel wall thickening or inflammatory changes. The appendix was not visualized; however there is no acute inflammatory process in the right lower quadrant. Vascular/Lymphatic: No ascites or pneumoperitoneum. No abdominal or pelvic lymphadenopathy, by size criteria. No aneurysmal dilation of the major abdominal arteries. There are mild peripheral atherosclerotic vascular calcifications of the aorta and its major branches. Reproductive: Normal size prostate. Symmetric seminal vesicles. Other: The visualized soft tissues and abdominal wall are unremarkable. Musculoskeletal: No suspicious osseous lesions. There are mild multilevel degenerative changes in the visualized spine. Review of the MIP images confirms the above findings. IMPRESSION: 1. Small volume pulmonary emboli in the left lung lower lobe and inferior lingula. No CT evidence of right heart strain or lung infarction. 2. Moderate-sized loculated right pleural/fissural effusion with thickened and hyperattenuating pleura. Findings are favored to represent empyema. Consider thoracentesis and cytology evaluation to exclude underlying neoplastic process. 3. Patchy ground-glass changes with smooth interlobular septal thickening in the right upper lobe and patchy such areas throughout the left lung. There is also mosaic attenuation in the left lung. Findings are nonspecific but may represent multilobar pneumonia/atypical pneumonia with or without superimposed small airway disease. 4. Indeterminate lesion posterior to the right thyroid lobe. Consider nonemergent thyroid ultrasound. 5. Multiple other nonacute observations, as described above. 6. No discrete metastatic process identified within the abdomen or pelvis. Critical Value/emergent results were called by telephone at the time of interpretation on 11/21/2022 at 11:22 am to provider [facility] , who verbally acknowledged these results. Electronically Signed   By: Jules Schick M.D.   On: 11/21/2022  11:35  DG Chest Port 1 View  Result Date: 11/21/2022 CLINICAL DATA:  Shortness of breath. EXAM: PORTABLE CHEST 1 VIEW COMPARISON:  November 29, 2015. FINDINGS: The heart size and mediastinal contours are within normal limits. Left lung is clear. Large right lung opacity is noted concerning for pneumonia or atelectasis with associated pleural effusion. The visualized skeletal structures are unremarkable. IMPRESSION: Large right lung opacity is noted concerning for pneumonia or atelectasis with associated pleural effusion. Underlying mass cannot be excluded. Electronically Signed   By: Lupita Raider M.D.   On: 11/21/2022 08:37    Pending Labs Unresulted Labs (From admission, onward)     Start     Ordered   11/22/22 0500  Basic metabolic panel  Tomorrow morning,   R        11/21/22 1229   11/22/22 0500  Heparin level (unfractionated)  Daily,   R      11/21/22 1229   11/22/22 0500  CBC  Daily,   R      11/21/22 1229   11/22/22 0500  CBC with Differential/Platelet  Tomorrow morning,   R        11/21/22 1229   11/21/22 2000  Heparin level (unfractionated)  Once-Timed,   TIMED        11/21/22 1229   11/21/22 1221  Technologist smear review  (Technologist smear review)  Add-on,   AD       Question:  Clinical information:  Answer:  WBC 32, weight loss, large pleural effusion, concern for malignant process   11/21/22 1220   11/21/22 1214  SARS Coronavirus 2 by RT PCR (hospital order, performed in [facility] hospital lab) *cepheid single result test* Anterior Nasal Swab  (Tier 2 - SARS Coronavirus 2 by RT PCR (hospital order, performed in [facility] hospital lab) *cepheid single result test*)  Once,   R        11/21/22 1217   11/21/22 1214  MTB-RIF NAA with AFB Culture, sputum (q8 x 3)  Now then every 8 hours,   R (with TIMED occurrences)     Question:  Specimen Source:  Answer:  Sputum   11/21/22 1217   11/21/22 1212  TSH  Once,   R        11/21/22 1217   11/21/22 1211  HIV Antibody (routine  testing w rflx)  (HIV Antibody (Routine testing w reflex) panel)  Once,   R        11/21/22 1217   11/21/22 0923  Culture, blood (routine x 2)  BLOOD CULTURE X 2,   R (with STAT occurrences)      11/21/22 0923   11/21/22 0751  Brain natriuretic peptide  Once,   URGENT        11/21/22 0750            Vitals/Pain Today's Vitals   11/21/22 1100 11/21/22 1130 11/21/22 1137 11/21/22 1200  BP: 117/74 106/70  (!) 124/58  Pulse: 82 70  90  Resp: 16 (!) 23  13  Temp:   97.7 F (36.5 C)   TempSrc:   Oral   SpO2: 90% 90%  93%  Weight:    78.5 kg  Height:    6\' 1"  (1.854 m)  PainSc:        Isolation Precautions Airborne and Contact precautions  Medications Medications  heparin bolus via infusion 4,700 Units (has no administration in time range)  heparin ADULT infusion 100 units/mL (25000 units/294mL) (has no administration in  time range)  lisinopril-hydrochlorothiazide (ZESTORETIC) 20-25 MG per tablet 1 tablet (has no administration in time range)  amLODipine (NORVASC) tablet 5 mg (has no administration in time range)  methadone (DOLOPHINE) tablet 80 mg (has no administration in time range)  azithromycin (ZITHROMAX) tablet 250 mg (has no administration in time range)  cefTRIAXone (ROCEPHIN) 1 g in sodium chloride 0.9 % 100 mL IVPB (has no administration in time range)  lactated ringers bolus 1,000 mL (has no administration in time range)  cefTRIAXone (ROCEPHIN) 1 g in sodium chloride 0.9 % 100 mL IVPB (0 g Intravenous Stopped 11/21/22 1055)  azithromycin (ZITHROMAX) 500 mg in sodium chloride 0.9 % 250 mL IVPB (0 mg Intravenous Stopped 11/21/22 1049)  iohexol (OMNIPAQUE) 350 MG/ML injection 75 mL (75 mLs Intravenous Contrast Given 11/21/22 1016)    Mobility walks     Focused Assessments Pulmonary Assessment Handoff:  Lung sounds: Bilateral Breath Sounds: Clear O2 Device: Room Air      R Recommendations: See Admitting Provider Note  Report given to:   Additional Notes:   PE, SOB

## 2022-11-21 NOTE — ED Notes (Signed)
Pt endorses SOB, palpitations and pain under right rib area for 3 days that worsened yesterday. SOB worse with exertion and laying back.

## 2022-11-21 NOTE — Progress Notes (Signed)
PHARMACY - ANTICOAGULATION CONSULT NOTE  Pharmacy Consult for Heparin  Indication: pulmonary embolus  No Known Allergies  Patient Measurements: Height: 6\' 1"  (185.4 cm) Weight: 78.5 kg (173 lb 1 oz) (09/2022) IBW/kg (Calculated) : 79.9 Heparin Dosing Weight: TBW  Vital Signs: Temp: 98.2 F (36.8 C) (10/03 1421) Temp Source: Axillary (10/03 1421) BP: 140/72 (10/03 1421) Pulse Rate: 82 (10/03 1421)  Labs: Recent Labs    11/21/22 0750 11/21/22 0812 11/21/22 0950 11/21/22 2102  HGB 10.1* 10.9*  --   --   HCT 31.7* 32.0*  --   --   PLT 333  --   --   --   LABPROT 16.1*  --   --   --   INR 1.3*  --   --   --   HEPARINUNFRC  --   --   --  <0.10*  CREATININE 1.83*  --   --   --   TROPONINIHS 58*  --  25*  --     Estimated Creatinine Clearance: 46.5 mL/min (A) (by C-G formula based on SCr of 1.83 mg/dL (H)).   Medical History: Past Medical History:  Diagnosis Date   Back pain, chronic    2007 fell off truck   Hernia    Rt. Groin - repaired 1997   HTN (hypertension)     Medications:  Scheduled:   [START ON 11/22/2022] amLODipine  5 mg Oral Daily   [START ON 11/22/2022] azithromycin  250 mg Oral Daily   [START ON 11/22/2022] methadone  80 mg Oral Daily   [START ON 11/22/2022] pneumococcal 20-valent conjugate vaccine  0.5 mL Intramuscular Tomorrow-1000   Assessment: 62 YO male presented with three weeks of progressively worsening DOE, fatigue/malaise, and weight loss. PMH of HTN. CTA revealed small left lower lobe PE. Not on anticoagulant PTA. No bleeding noted, Hgb low in the 10s. Plts are normal.    PM: heparin level < 0.1 on heparin 1250 units/hr. Per d/w RN, no issues with the infusion (no pauses, interruptions, infiltration) and no bleeding reported.  Goal of Therapy:  Heparin level 0.3-0.7 units/ml Monitor platelets by anticoagulation protocol: Yes   Plan:  Heparin 4000 units IV bolus Increase Heparin gtt to 1450 units/ hr Check 8-hour heparin level Daily  heparin level and CBC  Monitor s/sx of bleeding Follow up ability switching from heparin to a DOAC possibly at discharge.   Loralee Pacas, PharmD, BCPS 11/21/2022,9:57 PM  Please check AMION for all [facility] Pharmacy phone numbers After 10:00 PM, call Main Pharmacy [phone]

## 2022-11-21 NOTE — Progress Notes (Signed)
PHARMACY - ANTICOAGULATION CONSULT NOTE  Pharmacy Consult for Heparin  Indication: pulmonary embolus  No Known Allergies  Patient Measurements: Height: 6\' 1"  (185.4 cm) Weight: 78.5 kg (173 lb 1 oz) (09/2022) IBW/kg (Calculated) : 79.9 Heparin Dosing Weight: TBW  Vital Signs: Temp: 98.2 F (36.8 C) (10/03 1421) Temp Source: Axillary (10/03 1421) BP: 140/72 (10/03 1421) Pulse Rate: 82 (10/03 1421)  Labs: Recent Labs    11/21/22 0750 11/21/22 0812 11/21/22 0950  HGB 10.1* 10.9*  --   HCT 31.7* 32.0*  --   PLT 333  --   --   LABPROT 16.1*  --   --   INR 1.3*  --   --   CREATININE 1.83*  --   --   TROPONINIHS 58*  --  25*    Estimated Creatinine Clearance: 46.5 mL/min (A) (by C-G formula based on SCr of 1.83 mg/dL (H)).   Medical History: Past Medical History:  Diagnosis Date   Back pain, chronic    2007 fell off truck   Hernia    Rt. Groin - repaired 1997   HTN (hypertension)     Medications:  Scheduled:   [START ON 11/22/2022] amLODipine  5 mg Oral Daily   [START ON 11/22/2022] azithromycin  250 mg Oral Daily   [START ON 11/22/2022] methadone  80 mg Oral Daily   [START ON 11/22/2022] pneumococcal 20-valent conjugate vaccine  0.5 mL Intramuscular Tomorrow-1000   Assessment: 62 YO male presented with three weeks of progressively worsening DOE, fatigue/malaise, and weight loss. PMH of HTN. CTA revealed small left lower lobe PE. Not on anticoagulant PTA. No bleeding noted, Hgb low in the 10s. Plts are normal.    Goal of Therapy:  Heparin level 0.3-0.7 units/ml Monitor platelets by anticoagulation protocol: Yes   Plan:  Bolus: Heparin gtt @ 4700 units Maintenance: Heparin gtt @ 1250 units/ hr Monitor 8-hour heparin level Daily heparin level and CBC  Monitor s/sx of bleeding Follow up ability switching from heparin to a DOAC possibly at discharge.   Alesia Banda, PharmD Candidate Class of 2025 11/21/2022,2:39 PM

## 2022-11-21 NOTE — Assessment & Plan Note (Addendum)
WBC up at 32.0 with neutrophilic predominance.  - I have added on a smear review - Abx and further workup as above - repeat CBC with diff in the morning

## 2022-11-21 NOTE — ED Notes (Signed)
Patient transported to CT 

## 2022-11-21 NOTE — Assessment & Plan Note (Signed)
Small LLL pulmonary embolism. No evidence of R heart strain or lung infarction on CT. I doubt this is a significant contributor to his symptoms or his large R effusion. - On heparin gtt, continue for now. Likely can dc on DOAC - This suggests hypercoaguability and lends to the argument for malignant process

## 2022-11-21 NOTE — Assessment & Plan Note (Addendum)
Small LLL pulmonary embolism. No evidence of R heart strain or lung infarction on CT. I doubt this is a significant contributor to his symptoms or his large R effusion. - On heparin gtt, continue for now. Likely can dc on DOAC - This suggests hypercoaguability and lends to the argument for malignant process

## 2022-11-22 ENCOUNTER — Observation Stay (HOSPITAL_COMMUNITY): Payer: Medicare Other

## 2022-11-22 ENCOUNTER — Inpatient Hospital Stay (HOSPITAL_COMMUNITY): Payer: Medicare Other

## 2022-11-22 ENCOUNTER — Encounter (HOSPITAL_COMMUNITY): Payer: Self-pay | Admitting: Interventional Radiology

## 2022-11-22 ENCOUNTER — Other Ambulatory Visit (HOSPITAL_COMMUNITY): Payer: Self-pay

## 2022-11-22 DIAGNOSIS — K760 Fatty (change of) liver, not elsewhere classified: Secondary | ICD-10-CM | POA: Diagnosis present

## 2022-11-22 DIAGNOSIS — N179 Acute kidney failure, unspecified: Secondary | ICD-10-CM | POA: Diagnosis present

## 2022-11-22 DIAGNOSIS — R160 Hepatomegaly, not elsewhere classified: Secondary | ICD-10-CM | POA: Insufficient documentation

## 2022-11-22 DIAGNOSIS — J690 Pneumonitis due to inhalation of food and vomit: Secondary | ICD-10-CM | POA: Diagnosis present

## 2022-11-22 DIAGNOSIS — I2699 Other pulmonary embolism without acute cor pulmonale: Secondary | ICD-10-CM | POA: Diagnosis not present

## 2022-11-22 DIAGNOSIS — Z4682 Encounter for fitting and adjustment of non-vascular catheter: Secondary | ICD-10-CM | POA: Diagnosis not present

## 2022-11-22 DIAGNOSIS — J189 Pneumonia, unspecified organism: Secondary | ICD-10-CM | POA: Diagnosis not present

## 2022-11-22 DIAGNOSIS — R011 Cardiac murmur, unspecified: Secondary | ICD-10-CM | POA: Diagnosis present

## 2022-11-22 DIAGNOSIS — J90 Pleural effusion, not elsewhere classified: Secondary | ICD-10-CM

## 2022-11-22 DIAGNOSIS — Z1152 Encounter for screening for COVID-19: Secondary | ICD-10-CM | POA: Diagnosis not present

## 2022-11-22 DIAGNOSIS — I10 Essential (primary) hypertension: Secondary | ICD-10-CM | POA: Diagnosis present

## 2022-11-22 DIAGNOSIS — R590 Localized enlarged lymph nodes: Secondary | ICD-10-CM | POA: Diagnosis present

## 2022-11-22 DIAGNOSIS — J860 Pyothorax with fistula: Secondary | ICD-10-CM | POA: Diagnosis present

## 2022-11-22 DIAGNOSIS — D649 Anemia, unspecified: Secondary | ICD-10-CM | POA: Insufficient documentation

## 2022-11-22 DIAGNOSIS — R64 Cachexia: Secondary | ICD-10-CM | POA: Diagnosis present

## 2022-11-22 DIAGNOSIS — D509 Iron deficiency anemia, unspecified: Secondary | ICD-10-CM | POA: Diagnosis present

## 2022-11-22 DIAGNOSIS — E041 Nontoxic single thyroid nodule: Secondary | ICD-10-CM | POA: Diagnosis present

## 2022-11-22 DIAGNOSIS — I700 Atherosclerosis of aorta: Secondary | ICD-10-CM | POA: Diagnosis present

## 2022-11-22 DIAGNOSIS — F1111 Opioid abuse, in remission: Secondary | ICD-10-CM | POA: Diagnosis present

## 2022-11-22 DIAGNOSIS — Z66 Do not resuscitate: Secondary | ICD-10-CM | POA: Diagnosis present

## 2022-11-22 DIAGNOSIS — M549 Dorsalgia, unspecified: Secondary | ICD-10-CM | POA: Diagnosis present

## 2022-11-22 DIAGNOSIS — E876 Hypokalemia: Secondary | ICD-10-CM | POA: Insufficient documentation

## 2022-11-22 DIAGNOSIS — J918 Pleural effusion in other conditions classified elsewhere: Secondary | ICD-10-CM | POA: Diagnosis present

## 2022-11-22 DIAGNOSIS — J9811 Atelectasis: Secondary | ICD-10-CM | POA: Diagnosis present

## 2022-11-22 DIAGNOSIS — I2693 Single subsegmental pulmonary embolism without acute cor pulmonale: Secondary | ICD-10-CM | POA: Diagnosis present

## 2022-11-22 DIAGNOSIS — K5900 Constipation, unspecified: Secondary | ICD-10-CM | POA: Diagnosis not present

## 2022-11-22 DIAGNOSIS — E873 Alkalosis: Secondary | ICD-10-CM | POA: Diagnosis present

## 2022-11-22 DIAGNOSIS — R600 Localized edema: Secondary | ICD-10-CM | POA: Diagnosis present

## 2022-11-22 DIAGNOSIS — J869 Pyothorax without fistula: Secondary | ICD-10-CM | POA: Diagnosis not present

## 2022-11-22 HISTORY — PX: IR THORACENTESIS ASP PLEURAL SPACE W/IMG GUIDE: IMG5380

## 2022-11-22 LAB — BODY FLUID CELL COUNT WITH DIFFERENTIAL
Other Cells, Fluid: UNDETERMINED %
Total Nucleated Cell Count, Fluid: 2963 uL — ABNORMAL HIGH (ref 0–1000)

## 2022-11-22 LAB — GRAM STAIN

## 2022-11-22 LAB — LACTATE DEHYDROGENASE: LDH: 190 U/L (ref 98–192)

## 2022-11-22 LAB — CBC WITH DIFFERENTIAL/PLATELET
Abs Immature Granulocytes: 0.68 10*3/uL — ABNORMAL HIGH (ref 0.00–0.07)
Basophils Absolute: 0.1 10*3/uL (ref 0.0–0.1)
Basophils Relative: 0 %
Eosinophils Absolute: 0.2 10*3/uL (ref 0.0–0.5)
Eosinophils Relative: 1 %
HCT: 28.6 % — ABNORMAL LOW (ref 39.0–52.0)
Hemoglobin: 9.4 g/dL — ABNORMAL LOW (ref 13.0–17.0)
Immature Granulocytes: 2 %
Lymphocytes Relative: 4 %
Lymphs Abs: 1 10*3/uL (ref 0.7–4.0)
MCH: 26.7 pg (ref 26.0–34.0)
MCHC: 32.9 g/dL (ref 30.0–36.0)
MCV: 81.3 fL (ref 80.0–100.0)
Monocytes Absolute: 2.5 10*3/uL — ABNORMAL HIGH (ref 0.1–1.0)
Monocytes Relative: 9 %
Neutro Abs: 23.4 10*3/uL — ABNORMAL HIGH (ref 1.7–7.7)
Neutrophils Relative %: 84 %
Platelets: 322 10*3/uL (ref 150–400)
RBC: 3.52 MIL/uL — ABNORMAL LOW (ref 4.22–5.81)
RDW: 14.3 % (ref 11.5–15.5)
WBC: 27.8 10*3/uL — ABNORMAL HIGH (ref 4.0–10.5)
nRBC: 0 % (ref 0.0–0.2)

## 2022-11-22 LAB — IRON AND TIBC
Iron: 10 ug/dL — ABNORMAL LOW (ref 45–182)
Saturation Ratios: 6 % — ABNORMAL LOW (ref 17.9–39.5)
TIBC: 171 ug/dL — ABNORMAL LOW (ref 250–450)
UIBC: 161 ug/dL

## 2022-11-22 LAB — FOLATE: Folate: 7.1 ng/mL (ref >5.9)

## 2022-11-22 LAB — BASIC METABOLIC PANEL
Anion gap: 11 (ref 5–15)
BUN: 29 mg/dL — ABNORMAL HIGH (ref 8–23)
CO2: 28 mmol/L (ref 22–32)
Calcium: 8 mg/dL — ABNORMAL LOW (ref 8.9–10.3)
Chloride: 98 mmol/L (ref 98–111)
Creatinine, Ser: 1.49 mg/dL — ABNORMAL HIGH (ref 0.61–1.24)
GFR, Estimated: 53 mL/min — ABNORMAL LOW (ref >60)
Glucose, Bld: 91 mg/dL (ref 70–99)
Potassium: 3.4 mmol/L — ABNORMAL LOW (ref 3.5–5.1)
Sodium: 137 mmol/L (ref 135–145)

## 2022-11-22 LAB — FERRITIN: Ferritin: 453 ng/mL — ABNORMAL HIGH (ref 24–336)

## 2022-11-22 LAB — PROTEIN, PLEURAL OR PERITONEAL FLUID: Total protein, fluid: 4 g/dL

## 2022-11-22 LAB — VITAMIN B12: Vitamin B-12: 419 pg/mL (ref 180–914)

## 2022-11-22 LAB — LACTATE DEHYDROGENASE, PLEURAL OR PERITONEAL FLUID: LD, Fluid: 5203 U/L — ABNORMAL HIGH (ref 3–23)

## 2022-11-22 LAB — GLUCOSE, PLEURAL OR PERITONEAL FLUID: Glucose, Fluid: <20 mg/dL

## 2022-11-22 LAB — HEPARIN LEVEL (UNFRACTIONATED)
Heparin Unfractionated: <0.1 [IU]/mL — ABNORMAL LOW (ref 0.30–0.70)
Heparin Unfractionated: <0.1 [IU]/mL — ABNORMAL LOW (ref 0.30–0.70)

## 2022-11-22 LAB — GLUCOSE, RANDOM: Glucose, Bld: 77 mg/dL (ref 70–99)

## 2022-11-22 MED ORDER — ENOXAPARIN SODIUM 80 MG/0.8ML IJ SOSY
80.0000 mg | PREFILLED_SYRINGE | Freq: Two times a day (BID) | INTRAMUSCULAR | Status: DC
  Administered 2022-11-22 – 2022-11-23 (×2): 80 mg via SUBCUTANEOUS
  Filled 2022-11-22 (×2): qty 0.8

## 2022-11-22 MED ORDER — LIDOCAINE HCL 1 % IJ SOLN
20.0000 mL | Freq: Once | INTRAMUSCULAR | Status: AC
  Administered 2022-11-22: 10 mL
  Filled 2022-11-22: qty 20

## 2022-11-22 MED ORDER — LIDOCAINE HCL 1 % IJ SOLN
INTRAMUSCULAR | Status: AC
  Filled 2022-11-22: qty 20

## 2022-11-22 MED ORDER — FERROUS SULFATE 325 (65 FE) MG PO TABS
325.0000 mg | ORAL_TABLET | Freq: Every day | ORAL | Status: DC
  Administered 2022-11-23 – 2022-12-02 (×10): 325 mg via ORAL
  Filled 2022-11-22 (×10): qty 1

## 2022-11-22 MED ORDER — HEPARIN BOLUS VIA INFUSION
4000.0000 [IU] | Freq: Once | INTRAVENOUS | Status: AC
  Administered 2022-11-22: 4000 [IU] via INTRAVENOUS
  Filled 2022-11-22: qty 4000

## 2022-11-22 MED ORDER — POTASSIUM CHLORIDE CRYS ER 20 MEQ PO TBCR
40.0000 meq | EXTENDED_RELEASE_TABLET | Freq: Once | ORAL | Status: AC
  Administered 2022-11-22: 40 meq via ORAL
  Filled 2022-11-22: qty 2

## 2022-11-22 NOTE — Assessment & Plan Note (Addendum)
Small pulmonary embolism noted in LLL with no evidence of R heart strain or lung infarction on CT. Patient with some R sided pleuritic pain, improved from yesterday, otherwise asymptomatic. Not tachycardic or short of breath. Suspect PE is minor contributor to symptoms and large R pleural effusion. Suspect patient is hypercoagulable from potential underlying malignancy. Heparin level undetectable on labwork today.  - Heparin bolus 4000 units - Recheck heparin level after IV switch and continue on Heparin gtt during admission  - Consider transition to alternate anticoagulant option if repeat heparin level undetectable - DOAC on discharge

## 2022-11-22 NOTE — Assessment & Plan Note (Addendum)
Thyroid US with 2 cm nodule, recommended FNA biopsy. TSH WNL at 0.915. Patient with no tachycardia, weight changes.  - FNA biopsy outpatient

## 2022-11-22 NOTE — Assessment & Plan Note (Addendum)
K of 3.4 this morning. Patient asymptomatic.  - 40 meQ oral K - BMP in AM to trend electrolytes

## 2022-11-22 NOTE — TOC Benefit Eligibility Note (Signed)
Patient Product/process development scientist completed.    The patient is insured through [name]. Patient has Medicare and is not eligible for a copay card, but may be able to apply for patient assistance, if available.    Ran test claim for Eliquis 5 mg and the current 30 day co-pay is $504.60 due to a $545.00 deductible.  Ran test claim for Xarelto 20 mg and the current 30 day co-pay is $500.69 due to a $545.00 deductible.   This test claim was processed through [facility]- copay amounts may vary at other pharmacies due to pharmacy/plan contracts, or as the patient moves through the different stages of their insurance plan.     Roland Earl, CPHT Pharmacy Technician III Certified Patient Advocate [facility] Pharmacy Patient Advocate Team Direct Number: [phone]  Fax: [phone]

## 2022-11-22 NOTE — Assessment & Plan Note (Addendum)
Hgb 10.1 on admission, today 9.4. Patient denies any signs/symptoms consistent with active bleed. MCV 81.3. Baseline appears to be 13.  - Vitamin B12 - Folate - Ferritin - Iron and TIBC Studies - LDH, Haptoglobin

## 2022-11-22 NOTE — Progress Notes (Signed)
PHARMACY - ANTICOAGULATION CONSULT NOTE  Pharmacy Consult for Heparin >> Lovenox Indication: pulmonary embolus  No Known Allergies  Patient Measurements: Height: 6\' 1"  (185.4 cm) Weight: 79.7 kg (175 lb 11.3 oz) IBW/kg (Calculated) : 79.9 Heparin Dosing Weight: TBW  Vital Signs: Temp: 98.7 F (37.1 C) (10/04 2121) Temp Source: Oral (10/04 2121) BP: 119/65 (10/04 2121) Pulse Rate: 68 (10/04 2121)  Labs: Recent Labs    11/21/22 0750 11/21/22 0812 11/21/22 0950 11/21/22 2102 11/22/22 0401 11/22/22 0651 11/22/22 2046  HGB 10.1* 10.9*  --   --  9.4*  --   --   HCT 31.7* 32.0*  --   --  28.6*  --   --   PLT 333  --   --   --  322  --   --   LABPROT 16.1*  --   --   --   --   --   --   INR 1.3*  --   --   --   --   --   --   HEPARINUNFRC  --   --   --  <0.10*  --  <0.10* <0.10*  CREATININE 1.83*  --   --   --  1.49*  --   --   TROPONINIHS 58*  --  25*  --   --   --   --     Estimated Creatinine Clearance: 57.9 mL/min (A) (by C-G formula based on SCr of 1.49 mg/dL (H)).   Medical History: Past Medical History:  Diagnosis Date   Back pain, chronic    2007 fell off truck   Hernia    Rt. Groin - repaired 1997   HTN (hypertension)     Medications:  Scheduled:   amLODipine  5 mg Oral Daily   azithromycin  250 mg Oral Daily   [START ON 11/23/2022] ferrous sulfate  325 mg Oral Q breakfast   methadone  80 mg Oral Daily   pneumococcal 20-valent conjugate vaccine  0.5 mL Intramuscular Tomorrow-1000   Assessment: 62 YO male presented with three weeks of progressively worsening DOE, fatigue/malaise, and weight loss. PMH of HTN. CTA revealed small left lower lobe PE. Not on anticoagulant PTA. No bleeding noted, Hgb low in the 10s. Plts are normal.    Heparin level continues to be <0.1.  No improvement with absorption despite new IV site.  No issues with infusion or bleeding. No bleeding noted, CBC stable.  Family Medicine team is ok switching patient to Lovenox for more  predictable dosing and kinetics.  Goal of Therapy:  Anti-Xa level 0.6-1.2 (4hr after dose given) Monitor platelets by anticoagulation protocol: Yes   Plan:  Discontinue heparin and associated labs. Start Lovenox 80mg  SQ q12h Monitor s/sx of bleeding Follow up plans for DOAC possibly at discharge  Thank you for involving pharmacy in this patient's care.  [employer], Pharm.D., BCPS Clinical Pharmacist  **Pharmacist phone directory can be found on [email or web address] listed under [facility].  11/22/2022 9:45 PM

## 2022-11-22 NOTE — Progress Notes (Addendum)
Daily Progress Note Intern Pager: [phone]  Patient name: Stephen Lambert Medical record number: [id number] Date of birth: [birth year] Age: 62 y.o. Gender: male  Primary Care Provider: Alfredo Martinez, MD  Consultants: None Code Status: DNR with interventions  Pt Overview and Major Events to Date:  10/04: Admitted to FMTS  Assessment and Plan:  Stephen Lambert is a 72 yo M with PMHx of OUD (in remission, on methadone), and hypertension who presents here with pleural effusion and small pulmonary embolism in LLL.  Assessment & Plan Pleural effusion Some pleuritic R sided pain this morning, improved from yesterday. No other symptoms. Hemodynamically stable, satting well on RA. Cardipulmonary exam remarkable for diminished breath sounds on R side. Scheduled for thoracentesis today with IR. Net I/O +800cc. BNP mildly elevated to 165. Lactic acid WNL. Etiology of pleural effusion likely malignant vs infectious. Hepatitis panel and HIV Ab negative. Patient with no weight loss or hemoptysis recently. No known sick contacts, but possible increased risk of TB given frequent methadone clinic visits, and patient with marked leukocytosis. CHF exacerbation unlikely given no clinical evidence of volume overload and stable weight, and evidence of loculation of pleural effusion, not consistent with heart failure related effusion.  - Follow up AFB Smear and Culture - Thoracentesis with labs - Supplemental O2 PRN - Continue Abx coverage with Rocephin and Azithromycin Methadone use Well-controlled. Home dose is 80mg  daily. - Continue Methadone 80 mg daily  Pulmonary embolism (HCC) Small pulmonary embolism noted in LLL with no evidence of R heart strain or lung infarction on CT. Patient with some R sided pleuritic pain, improved from yesterday, otherwise asymptomatic. Not tachycardic or short of breath. Suspect PE is minor contributor to symptoms and large R pleural effusion. Suspect patient is  hypercoagulable from potential underlying malignancy. Heparin level undetectable on labwork today.  - Heparin bolus 4000 units - Recheck heparin level after IV switch and continue on Heparin gtt during admission  - Consider transition to alternate anticoagulant option if repeat heparin level undetectable - DOAC on discharge  Hepatomegaly Moderate hepatomegaly noted on exam. Patient reports R sided pleuritic pain and some RUQ discomfort. Some localized tenderness in RUQ, however, may be related to intrathoracic process as well. Hepatitis panel and HIV Ab negative. No other symptoms. RUQ [place] with 7 mm hypoechoic mass in the left hepatic lobe. LFTs WNL (21/18).  - Continue to monitor for clinical signs and symptoms of RUQ tenderness following thoracentesis.  Anemia Hgb 10.1 on admission, today 9.4. Patient denies any signs/symptoms consistent with active bleed. MCV 81.3. Baseline appears to be 13.  - Vitamin B12 - Folate - Ferritin - Iron and TIBC Studies - LDH, Haptoglobin  AKI (acute kidney injury) (HCC) Cr of 1.49 this AM, baseline 1.4-1.5. Suspect AKI was in the setting of poor PO intake, likely pre renal, now improving.  - BMP in AM to trend renal function  Thyroid mass Thyroid US with 2 cm nodule, recommended FNA biopsy. TSH WNL at 0.915. Patient with no tachycardia, weight changes.  - FNA biopsy outpatient Leukocytosis WBC downtrending today (32 > 27). Still with neutrophilic predominance. Blood cultures negative at 24 hours, COVID negative. AFB smear pending. Patient afebrile, with no cough or hemoptysis, no other infectious symptoms.  - Continue IV Abx with Rocephin and Azithromycin - Follow up AFB smear - Follow up blood cultures Hypokalemia K of 3.4 this morning. Patient asymptomatic.  - 40 meQ oral K - BMP in AM to trend electrolytes  Chronic and Stable Issues: HTN: Continue home Amlodipine 5 mg daily. Holding Lisinopril/hydrochlorothiazide 20-25 daily due to AKI  FEN/GI:  Regular Diet  PPx: On Heparin GTT Dispo: Home pending clinical improvement   Subjective:  Patient reports he is feeling improved this morning. States he is still having some pleuritic R sided pain particularly with standing and some RUQ discomfort. No nausea, vomiting, diarrhea. No other chest pain, shortness of breath, cough. No HA, focal weakness/numbness.   Objective: Temp:  [97.7 F (36.5 C)-99.7 F (37.6 C)] 98.9 F (37.2 C) (10/04 0545) Pulse Rate:  [61-95] 73 (10/04 0545) Resp:  [12-23] 20 (10/04 0545) BP: (106-152)/(58-84) 130/70 (10/04 0545) SpO2:  [90 %-95 %] 95 % (10/04 0545) Weight:  [78.5 kg] 78.5 kg (10/03 1200) Physical Exam: General: Chronically ill appearing, temporal wasting. Alert and oriented in no acute distress.  Cardiovascular: Regular rate and rhythm with no murmurs, rubs, gallops.  Respiratory: Normal work of breathing on room air. Diminished breath sounds in R lung fields, particularly in R base. Intact air movement on L. No focal wheezing, rales.  Abdomen: Some tenderness to palpation in upper abdomen, most in RUQ. Some hepatomegaly, however, patient reports pain is more in ribs/R side than RUQ.  Extremities: Warm and well perfused. Trace bilateral edema which is non pitting.   Laboratory: Most recent CBC Lab Results  Component Value Date   WBC 27.8 (H) 11/22/2022   HGB 9.4 (L) 11/22/2022   HCT 28.6 (L) 11/22/2022   MCV 81.3 11/22/2022   PLT 322 11/22/2022   Most recent BMP    Latest Ref Rng & Units 11/22/2022    4:01 AM  BMP  Glucose 70 - 99 mg/dL 91   BUN 8 - 23 mg/dL 29   Creatinine 1.30 - 1.24 mg/dL 8.65   Sodium 784 - 696 mmol/L 137   Potassium 3.5 - 5.1 mmol/L 3.4   Chloride 98 - 111 mmol/L 98   CO2 22 - 32 mmol/L 28   Calcium 8.9 - 10.3 mg/dL 8.0     Other Pertinent Labs:  Blood cultures with no growth at 24 hours Troponin: 58 > 25 Lactic Acid 1.0 UA negative UDS negative COVId negative TSH 0.915 BNP 165 HIV negative,  Hepatitis panel non reactive  Imaging/Diagnostic Tests: CXR: Large right lung opacity is noted concerning for pneumonia or atelectasis with associated pleural effusion. Underlying mass cannot be excluded.  CTA Chest IMPRESSION: 1. Small volume pulmonary emboli in the left lung lower lobe and inferior lingula. No CT evidence of right heart strain or lung infarction. 2. Moderate-sized loculated right pleural/fissural effusion with thickened and hyperattenuating pleura. Findings are favored to represent empyema. Consider thoracentesis and cytology evaluation to exclude underlying neoplastic process. 3. Patchy ground-glass changes with smooth interlobular septal thickening in the right upper lobe and patchy such areas throughout the left lung. There is also mosaic attenuation in the left lung. Findings are nonspecific but may represent multilobar pneumonia/atypical pneumonia with or without superimposed small airway disease. 4. Indeterminate lesion posterior to the right thyroid lobe. Consider nonemergent thyroid ultrasound. 5. Multiple other nonacute observations, as described above. 6. No discrete metastatic process identified within the abdomen or pelvis.  CT A/P:  Moderate-to-severe early enlarged liver measuring up to 22.4 cm in length. Non-cirrhotic configuration. No suspicious mass. There is a 5 mm hypoattenuating focus in the left hepatic dome, which is too small to adequately characterize no intrahepatic or extrahepatic bile duct dilation.  RUQ [place]: 1. Small amount of sludge  within the gallbladder lumen. No gallbladder wall thickening or pericholecystic fluid. 2. There is a 7 mm hypoechoic mass in the left hepatic lobe, too small to characterize. Recommend attention on follow-up.  Thyroid US: R lobe nodule 2 cm   [place], Medical Student 11/22/2022, 7:37 AM  I agree with the plan described in the student's note.  Janeal Holmes, MD                  11/22/2022, 12:39  PM PGY-2, [facility] FPTS Intern pager: [phone], text pages welcome Secure chat group [facility] Teaching Service

## 2022-11-22 NOTE — Plan of Care (Signed)
  Problem: Education: Goal: Knowledge of General Education information will improve Description: Including pain rating scale, medication(s)/side effects and non-pharmacologic comfort measures Outcome: Progressing   Problem: Health Behavior/Discharge Planning: Goal: Ability to manage health-related needs will improve Outcome: Progressing   Problem: Clinical Measurements: Goal: Ability to maintain clinical measurements within normal limits will improve Outcome: Progressing Goal: Will remain free from infection Outcome: Progressing Goal: Diagnostic test results will improve Outcome: Progressing Goal: Respiratory complications will improve Outcome: Progressing Goal: Cardiovascular complication will be avoided Outcome: Progressing   Problem: Clinical Measurements: Goal: Will remain free from infection Outcome: Progressing   Problem: Clinical Measurements: Goal: Diagnostic test results will improve Outcome: Progressing   Problem: Clinical Measurements: Goal: Respiratory complications will improve Outcome: Progressing   Problem: Clinical Measurements: Goal: Cardiovascular complication will be avoided Outcome: Progressing   Problem: Nutrition: Goal: Adequate nutrition will be maintained Outcome: Progressing   Problem: Coping: Goal: Level of anxiety will decrease Outcome: Progressing

## 2022-11-22 NOTE — Assessment & Plan Note (Addendum)
Some pleuritic R sided pain this morning, improved from yesterday. No other symptoms. Hemodynamically stable, satting well on RA. Cardipulmonary exam remarkable for diminished breath sounds on R side. Scheduled for thoracentesis today with IR. Net I/O +800cc. BNP mildly elevated to 165. Lactic acid WNL. Etiology of pleural effusion likely malignant vs infectious. Hepatitis panel and HIV Ab negative. Patient with no weight loss or hemoptysis recently. No known sick contacts, but possible increased risk of TB given frequent methadone clinic visits, and patient with marked leukocytosis. CHF exacerbation unlikely given no clinical evidence of volume overload and stable weight, and evidence of loculation of pleural effusion, not consistent with heart failure related effusion.  - Follow up AFB Smear and Culture - Thoracentesis with labs - Supplemental O2 PRN - Continue Abx coverage with Rocephin and Azithromycin

## 2022-11-22 NOTE — Plan of Care (Signed)

## 2022-11-22 NOTE — Procedures (Signed)
PROCEDURE SUMMARY:  Pre procedural Dx: Symptomatic pleural effusion Post procedural Dx: Same  Successful US guided Right sided thoracentesis yielding of hazy yellow pleural fluid.   Samples sent to lab for analysis.  EBL: <1 mL  Complications: None immediate.  Post procedural CXR Ordered.    Eliezer Mccoy MD PGY-3 [facility] 11/22/2022 1:13 PM

## 2022-11-22 NOTE — Assessment & Plan Note (Addendum)
Well-controlled. Home dose is 80mg  daily. - Continue Methadone 80 mg daily

## 2022-11-22 NOTE — Assessment & Plan Note (Signed)
Moderate-to-severe. Palpable on exam. Hard to tell if tender as I think he has some localized tenderness in the RUQ related to the intrathoracic process, but will treat as tender hepatomegaly. LFTs reassuringly WNL.  - RUQ [place] - Hepatitis panel

## 2022-11-22 NOTE — Progress Notes (Signed)
PHARMACY - ANTICOAGULATION CONSULT NOTE  Pharmacy Consult for Heparin  Indication: pulmonary embolus  No Known Allergies  Patient Measurements: Height: 6\' 1"  (185.4 cm) Weight: 78.5 kg (173 lb 1 oz) (09/2022) IBW/kg (Calculated) : 79.9 Heparin Dosing Weight: TBW  Vital Signs: Temp: 98.9 F (37.2 C) (10/04 0545) Temp Source: Oral (10/04 0545) BP: 130/70 (10/04 0545) Pulse Rate: 73 (10/04 0545)  Labs: Recent Labs    11/21/22 0750 11/21/22 0812 11/21/22 0950 11/21/22 2102 11/22/22 0401 11/22/22 0651  HGB 10.1* 10.9*  --   --  9.4*  --   HCT 31.7* 32.0*  --   --  28.6*  --   PLT 333  --   --   --  322  --   LABPROT 16.1*  --   --   --   --   --   INR 1.3*  --   --   --   --   --   HEPARINUNFRC  --   --   --  <0.10*  --  <0.10*  CREATININE 1.83*  --   --   --  1.49*  --   TROPONINIHS 58*  --  25*  --   --   --     Estimated Creatinine Clearance: 57.1 mL/min (A) (by C-G formula based on SCr of 1.49 mg/dL (H)).   Medical History: Past Medical History:  Diagnosis Date   Back pain, chronic    2007 fell off truck   Hernia    Rt. Groin - repaired 1997   HTN (hypertension)     Medications:  Scheduled:   amLODipine  5 mg Oral Daily   azithromycin  250 mg Oral Daily   heparin  4,000 Units Intravenous Once   methadone  80 mg Oral Daily   pneumococcal 20-valent conjugate vaccine  0.5 mL Intramuscular Tomorrow-1000   Assessment: 62 YO male presented with three weeks of progressively worsening DOE, fatigue/malaise, and weight loss. PMH of HTN. CTA revealed small left lower lobe PE. Not on anticoagulant PTA. No bleeding noted, Hgb low in the 10s. Plts are normal.    Heparin level is <0.1 this morning (<0.1 x2 now). Spoke with RN who will assess IV site and report back any issues. Suggested moving heparin drip to other IV site. No issues with infusion or bleeding. No bleeding noted, CBC stable.  Goal of Therapy:  Heparin level 0.3-0.7 units/ml Monitor platelets by  anticoagulation protocol: Yes   Plan:  Heparin 4000 units IV bolus Increase heparin gtt to 1750 units/hr 8-hour heparin level Daily heparin level and CBC  Monitor s/sx of bleeding Follow up ability switching from heparin to a DOAC possibly at discharge  Thank you for involving pharmacy in this patient's care.  Loura Back, PharmD, BCPS Clinical Pharmacist Clinical phone for 11/22/2022 is [phone] 11/22/2022 8:38 AM

## 2022-11-22 NOTE — Assessment & Plan Note (Addendum)
Cr of 1.49 this AM, baseline 1.4-1.5. Suspect AKI was in the setting of poor PO intake, likely pre renal, now improving.  - BMP in AM to trend renal function

## 2022-11-22 NOTE — Hospital Course (Addendum)
Stephen Lambert is a 62 yo M with past medical history of OUD (in remission, on methadone), and hypertension who was admitted to [facility] to the [facility] Medicine Teaching Service on 11/21/2022 with increased dyspnea on exertion, found to have a large R pleural effusion.   His hospital course is as follows:  Pleural Effusion  Presented with increased dyspnea on exertion, indolent course, has been progressively worsening over past several months. Found to have moderate sized loculated R pleural effusion with thickened pleura on CTA chest. Hemodynamically stable, satting well on RA. Cardipulmonary exam remarkable for diminished breath sounds on R side. Workup including hepatitis panel, HIV Ab, lactate negative. AFB smear negative. Etiology of pleural effusion likely malignant vs infectious. Patient with marked leukocytosis and loculation of effusion, suggestive of infectious process. Thoracentesis scheduled completed with minimal output, chest tube placed on 10/6 initially with ~1L thick yellow output, then serosanguinous output. Pleural lytics administered per CCM as well. Pleural fluid cytology with no malignant cells. Pleural fluid culture 10/6 with strep intermedius. Treated with IV Unasyn (10/5 - 10/13), and Azithromycin (10/4-10/7).  Chest tube ultimately removed on 10/12 given improvement in volume of drainage.  He was discharged on Augmentin for prolonged course of 4-6 weeks per CCM recommendations.   Pulmonary Embolism Small pulmonary embolism noted in LLL with no evidence of R heart strain or lung infarction on CT. Patient remained hemodynamically stable during admission. Started on heparin gtt, however heparin level undetectable on labwork on 10/4. Heparin gtt discontinued, systemic anticoagulation was held 10/6 in the setting of administered pleural lytics. Resumed systemic anticoagulation with therapeutic Lovenox 10/7. Continue DOAC on discharge with Eliquis 5mg  BID. Recommend  follow up for continued management of anticoagulation in the setting of pulmonary embolism.   Liver Mass  Hepatomegaly Patient with some hepatomegaly on admission and RUQ discomfort, thought to also be related to referred intrathoracic pain from R pleural effusion. LFTs WNL. RUQ with 7 mm hypoechoic mass in L hepatic lobe. Hepatitis panel and HIV Ab negative. Recommend outpatient follow up for further workup regarding liver mass.  Thyroid Nodule  Presented with palpable mass deep to R thyroid along lateral trachea. Thyroid US with 2 cm nodule. TSH WNL at 0.915. Recommend FNA biopsy outpatient.   Anemia Hgb on admission of 10.1, downtrended steadily and was 7.6 by the date of discharge. Anemia workup consistent with anemia of chronic disease and iron deficiency anemia, patient initiated on PO iron during stay. Recommend further management outpatient of anemia.    Bilateral lower extremity edema Presented over admission.  Echo with some component of potential diastolic CHF.  Hepatic panel reassuring and kidney function improved over admission. Improved by date of discharge, recommend outpatient follow up.  Other chronic conditions were medically managed with home medications and formulary alternatives as necessary: - Hypertension - held home amlodipine, lisinopril/hydrochlorothiazide in setting of soft BP and AKI - OUD - continued on home methadone 80mg  daily, MiraLAX as needed.  PCP Follow Up Recommendations: Obtain repeat RUQ [place] in 3 months to follow up hypoechoic liver mass. Obtain dental follow up given pleural cultures with Strep Intermedius  F/u BLE swelling Follow up BP and add back amlodipine and lisinopril-hydrochlorothiazide as tolerated. Repeat CBC given anemia over admission

## 2022-11-22 NOTE — Assessment & Plan Note (Addendum)
WBC downtrending today (32 > 27). Still with neutrophilic predominance. Blood cultures negative at 24 hours, COVID negative. AFB smear pending. Patient afebrile, with no cough or hemoptysis, no other infectious symptoms.  - Continue IV Abx with Rocephin and Azithromycin - Follow up AFB smear - Follow up blood cultures

## 2022-11-22 NOTE — Assessment & Plan Note (Addendum)
Moderate hepatomegaly noted on exam. Patient reports R sided pleuritic pain and some RUQ discomfort. Some localized tenderness in RUQ, however, may be related to intrathoracic process as well. Hepatitis panel and HIV Ab negative. No other symptoms. RUQ [place] with 7 mm hypoechoic mass in the left hepatic lobe. LFTs WNL (21/18).  - Continue to monitor for clinical signs and symptoms of RUQ tenderness following thoracentesis.

## 2022-11-23 ENCOUNTER — Encounter (HOSPITAL_COMMUNITY): Payer: Self-pay | Admitting: Family Medicine

## 2022-11-23 ENCOUNTER — Inpatient Hospital Stay (HOSPITAL_COMMUNITY): Payer: Medicare Other

## 2022-11-23 DIAGNOSIS — J90 Pleural effusion, not elsewhere classified: Secondary | ICD-10-CM | POA: Diagnosis not present

## 2022-11-23 DIAGNOSIS — I2699 Other pulmonary embolism without acute cor pulmonale: Secondary | ICD-10-CM | POA: Diagnosis not present

## 2022-11-23 DIAGNOSIS — J189 Pneumonia, unspecified organism: Secondary | ICD-10-CM | POA: Diagnosis not present

## 2022-11-23 DIAGNOSIS — I2693 Single subsegmental pulmonary embolism without acute cor pulmonale: Secondary | ICD-10-CM

## 2022-11-23 DIAGNOSIS — Z4682 Encounter for fitting and adjustment of non-vascular catheter: Secondary | ICD-10-CM

## 2022-11-23 LAB — BASIC METABOLIC PANEL
Anion gap: 13 (ref 5–15)
BUN: 30 mg/dL — ABNORMAL HIGH (ref 8–23)
CO2: 27 mmol/L (ref 22–32)
Calcium: 8.4 mg/dL — ABNORMAL LOW (ref 8.9–10.3)
Chloride: 99 mmol/L (ref 98–111)
Creatinine, Ser: 1.43 mg/dL — ABNORMAL HIGH (ref 0.61–1.24)
GFR, Estimated: 55 mL/min — ABNORMAL LOW (ref >60)
Glucose, Bld: 81 mg/dL (ref 70–99)
Potassium: 4 mmol/L (ref 3.5–5.1)
Sodium: 139 mmol/L (ref 135–145)

## 2022-11-23 LAB — CBC
HCT: 29.6 % — ABNORMAL LOW (ref 39.0–52.0)
Hemoglobin: 9.4 g/dL — ABNORMAL LOW (ref 13.0–17.0)
MCH: 25.5 pg — ABNORMAL LOW (ref 26.0–34.0)
MCHC: 31.8 g/dL (ref 30.0–36.0)
MCV: 80.4 fL (ref 80.0–100.0)
Platelets: 337 10*3/uL (ref 150–400)
RBC: 3.68 MIL/uL — ABNORMAL LOW (ref 4.22–5.81)
RDW: 14.2 % (ref 11.5–15.5)
WBC: 26.3 10*3/uL — ABNORMAL HIGH (ref 4.0–10.5)
nRBC: 0 % (ref 0.0–0.2)

## 2022-11-23 LAB — HAPTOGLOBIN: Haptoglobin: 380 mg/dL — ABNORMAL HIGH (ref 32–363)

## 2022-11-23 MED ORDER — HEPARIN (PORCINE) 25000 UT/250ML-% IV SOLN
1950.0000 [IU]/h | INTRAVENOUS | Status: DC
  Administered 2022-11-23: 1750 [IU]/h via INTRAVENOUS
  Administered 2022-11-24: 1950 [IU]/h via INTRAVENOUS
  Filled 2022-11-23 (×2): qty 250

## 2022-11-23 MED ORDER — IOHEXOL 350 MG/ML SOLN
50.0000 mL | Freq: Once | INTRAVENOUS | Status: AC | PRN
  Administered 2022-11-23: 50 mL via INTRAVENOUS

## 2022-11-23 NOTE — Assessment & Plan Note (Deleted)
K of 3.4 this morning. Patient asymptomatic.  - 40 meQ oral K - BMP in AM to trend electrolytes

## 2022-11-23 NOTE — Assessment & Plan Note (Addendum)
WBC downtrending today (27.8 > 26.3). Still with neutrophilic predominance. Blood cultures negative at 48 hours, COVID negative. AFB smear pending. Patient afebrile, with no cough or hemoptysis, no other infectious symptoms.  - Continue IV Abx with Rocephin and Azithromycin - Follow up AFB smear - Follow up blood cultures

## 2022-11-23 NOTE — Assessment & Plan Note (Addendum)
Baseline Cr 1.4-1.5, elevated to 1.83 on 10/3. Now back to baseline, 1.43 this AM. AKI likely prerenal in setting of poor PO intake, resolved.  - BMP in AM to trend renal function

## 2022-11-23 NOTE — Assessment & Plan Note (Signed)
Thyroid US with 2 cm nodule, recommended FNA biopsy. TSH WNL at 0.915. Patient with no tachycardia, weight changes.  - FNA biopsy outpatient

## 2022-11-23 NOTE — Assessment & Plan Note (Addendum)
Well-controlled. Home dose is 80mg  daily. - Continue Methadone 80 mg daily

## 2022-11-23 NOTE — Progress Notes (Signed)
PHARMACY - ANTICOAGULATION CONSULT NOTE  Pharmacy Consult for Heparin >> Lovenox >> now back to heparin Indication: pulmonary embolus  No Known Allergies  Patient Measurements: Height: 6\' 1"  (185.4 cm) Weight: 79.7 kg (175 lb 11.3 oz) IBW/kg (Calculated) : 79.9 Heparin Dosing Weight: TBW  Vital Signs: Temp: 98.2 F (36.8 C) (10/05 0941) Temp Source: Oral (10/05 0941) BP: 107/82 (10/05 0941) Pulse Rate: 69 (10/05 0941)  Labs: Recent Labs    11/21/22 0750 11/21/22 0812 11/21/22 0950 11/21/22 2102 11/22/22 0401 11/22/22 0651 11/22/22 2046 11/23/22 0538  HGB 10.1* 10.9*  --   --  9.4*  --   --  9.4*  HCT 31.7* 32.0*  --   --  28.6*  --   --  29.6*  PLT 333  --   --   --  322  --   --  337  LABPROT 16.1*  --   --   --   --   --   --   --   INR 1.3*  --   --   --   --   --   --   --   HEPARINUNFRC  --   --   --  <0.10*  --  <0.10* <0.10*  --   CREATININE 1.83*  --   --   --  1.49*  --   --  1.43*  TROPONINIHS 58*  --  25*  --   --   --   --   --     Estimated Creatinine Clearance: 60.4 mL/min (A) (by C-G formula based on SCr of 1.43 mg/dL (H)).   Medical History: Past Medical History:  Diagnosis Date   Back pain, chronic    2007 fell off truck   Hernia    Rt. Groin - repaired 1997   HTN (hypertension)     Medications:  Scheduled:   amLODipine  5 mg Oral Daily   azithromycin  250 mg Oral Daily   ferrous sulfate  325 mg Oral Q breakfast   methadone  80 mg Oral Daily   pneumococcal 20-valent conjugate vaccine  0.5 mL Intramuscular Tomorrow-1000   Assessment: 62 YO male presented with three weeks of progressively worsening DOE, fatigue/malaise, and weight loss. PMH of HTN. CTA revealed small left lower lobe PE. Not on anticoagulant PTA. No bleeding noted, Hgb low in the 10s. Plts are normal.    Patient transitioned from Heparin to LMWH due to persistently subtherapeutic heparin level. Received Lovenox 80 mg today at 1044. Now planning for chest tube for lytic  administration, and CCM is consulting to switch back to UFH peri-procedurally. Will initiate heparin 4 hours prior to next lovenox dose - per protocol, to start at 1900. Will cautiously restart at last dose, but note that patient was with undetectable HL upon transition to lovenox. Will obtain both HL and aPTT due to recent Lovenox administration.  Goal of Therapy:  Anti-Xa level 0.6-1.2 (4hr after dose given) Monitor platelets by anticoagulation protocol: Yes   Plan:  Restart Heparin 1750 units/hr (21 u/kg/hr) at 1900 11/23/22 Heparin level and aPTT 6 hours post initiation at 0100 11/24/22 Monitor CBC Monitor s/sx of bleeding Follow up transition to oral [facility]  Thank you for involving pharmacy in this patient's care.  Lora Paula, PharmD PGY-2 Infectious Diseases Pharmacy Resident 11/23/2022 1:52 PM  **Pharmacist phone directory can be found on [email or web address] listed under [facility].

## 2022-11-23 NOTE — Progress Notes (Signed)
CT reviewed> still has large fluid collection with rhind. Will consult TCTS tomorrow for consideration for VATs. Will make NPO past midnight until they see him tomorrow. Ok to start heparin infusion as planned.  Steffanie Dunn, DO 11/23/22 6:29 PM [facility]  For contact information, see Amion. If no response to pager, please call PCCM consult pager. After hours, 7PM- 7AM, please call Elink.

## 2022-11-23 NOTE — Plan of Care (Signed)
Pt up independent in his room. To start heparin drip at 1900.

## 2022-11-23 NOTE — Assessment & Plan Note (Addendum)
Hgb stable today at 9.4, unchanged from yesterday. No signs/symptoms consistent with active bleed. Elevated Ferritin to 453, Iron/TIBC with low iron, and low saturation. LDH 190. Labs consistent with iron deficiency anemia/anemia of chronic disease. Started on oral iron 10/4.  - Continue ferrous sulfate 325 mg PO daily

## 2022-11-23 NOTE — Plan of Care (Signed)

## 2022-11-23 NOTE — Assessment & Plan Note (Addendum)
Moderate hepatomegaly noted on exam. Some localized tenderness in RUQ, however, may be related to intrathoracic process as well. Hepatitis panel and HIV Ab negative. No other symptoms. RUQ [place] with 7 mm hypoechoic mass in the left hepatic lobe. LFTs WNL (21/18).  - Continue to monitor for clinical signs and symptoms of RUQ tenderness following thoracentesis.

## 2022-11-23 NOTE — Progress Notes (Signed)
Attempted bedside chest tube placement. He does not have a pocket of fluid identifiable by [place]. Concerned for fluid getting organized, may not be amenable to US guided chest tube placement. Repeat chest CT; may need CTS consult for possible VATS- TBD after CT scan.   Steffanie Dunn, DO 11/23/22 4:17 PM [facility]  For contact information, see Amion. If no response to pager, please call PCCM consult pager. After hours, 7PM- 7AM, please call Elink.

## 2022-11-23 NOTE — Progress Notes (Addendum)
Daily Progress Note Intern Pager: [phone]  Patient name: Stephen Lambert Medical record number: [id number] Date of birth: [birth year] Age: 62 y.o. Gender: male  Primary Care Provider: Alfredo Martinez, MD Consultants: None  Code Status: DNR with interventions  Pt Overview and Major Events to Date:  10/04: Admitted to [facility]MTS  Assessment and Plan:  Stephen Lambert is a 45 yo M with PMHx of OUD (in remission, on methadone), and hypertension who presents here with pleural effusion and small pulmonary embolism in LLL.  Assessment & Plan Pleural effusion Improved R sided pleuritic pain after thoracentesis this morning. No other symptoms. Hemodynamically stable. Cardiopulmonary exam remarkable for diminished breath sounds on R side. Thoracentesis performed 10/4 with 100 mL of hazy fluid. Pleural fluid labs with LDH elevated to 5203, no organisms/WBC noted on gram stain, pleural glucose and protein WNL. Pleural cultures with no growth <24 hours. CXR post thoracentesis with persistent opacification through R hemithorax. Etiology of pleural effusion likely malignant vs infectious. Exudative effusion per Light's Criteria (LDH ratio > 0.5). Increased risk of TB from frequent methadone clinic visits, and patient with marked leukocytosis. However, indolent nature of symptoms consistent with malignancy. CHF exacerbation unlikely given unilateral loculated effusion, and no evidence of volume overload.  - Consult CCM/Pulmonology for potential chest tube placement - Follow up AFB Smear and Culture - Supplemental O2 PRN - Continue Abx coverage with Rocephin and Azithromycin  Pulmonary embolism (HCC) Small pulmonary embolism noted in LLL with no evidence of R heart strain or lung infarction on CT. Patient hemodynamically stable. Initiated on heparin gtt, however, heparin level undetectable on labwork 10/4, transitioned to SQ Lovenox BID per pharmacy recommendations on 10/4.  - CCM consulted, appreciate  recommendations - Transition to Heparin GTT due to impending procedure of chest tube placement - DOAC on discharge.  Hepatomegaly Moderate hepatomegaly noted on exam. Some localized tenderness in RUQ, however, may be related to intrathoracic process as well. Hepatitis panel and HIV Ab negative. No other symptoms. RUQ [place] with 7 mm hypoechoic mass in the left hepatic lobe. LFTs WNL (21/18).  - Continue to monitor for clinical signs and symptoms of RUQ tenderness following thoracentesis.  Anemia Hgb stable today at 9.4, unchanged from yesterday. No signs/symptoms consistent with active bleed. Elevated Ferritin to 453, Iron/TIBC with low iron, and low saturation. LDH 190. Labs consistent with iron deficiency anemia/anemia of chronic disease. Started on oral iron 10/4.  - Continue ferrous sulfate 325 mg PO daily  AKI (acute kidney injury) (HCC) Baseline Cr 1.4-1.5, elevated to 1.83 on 10/3. Now back to baseline, 1.43 this AM. AKI likely prerenal in setting of poor PO intake, resolved.  - BMP in AM to trend renal function  Thyroid mass Thyroid US with 2 cm nodule, recommended FNA biopsy. TSH WNL at 0.915. Patient with no tachycardia, weight changes.  - FNA biopsy outpatient Leukocytosis WBC downtrending today (27.8 > 26.3). Still with neutrophilic predominance. Blood cultures negative at 48 hours, COVID negative. AFB smear pending. Patient afebrile, with no cough or hemoptysis, no other infectious symptoms.  - Continue IV Abx with Rocephin and Azithromycin - Follow up AFB smear - Follow up blood cultures  Methadone use Well-controlled. Home dose is 80mg  daily. - Continue Methadone 80 mg daily   Chronic and Stable Issues: HTN: Continue home Amlodipine 5 mg daily. Holding Lisinopril/hydrochlorothiazide 20-25 daily due to AKI Constipation: Miralax and Senna  FEN/GI: Regular Diet PPx: Lovenox  Dispo: Home pending clinical improvement  Subjective:  Patient is feeling improved this morning  following his thoracentesis. Still having some R sided pleuritic pain, but feels it is improved from yesterday. No chest pain, abdominal pain, nausea, vomiting, diarrhea. Also reports feeling a little constipated, not had a BM since arriving at the hospital.   Objective: Temp:  [97.9 F (36.6 C)-99.7 F (37.6 C)] 99.7 F (37.6 C) (10/05 0450) Pulse Rate:  [67-78] 68 (10/05 0450) Resp:  [17-20] 20 (10/05 0450) BP: (114-130)/(64-77) 124/65 (10/05 0450) SpO2:  [94 %-100 %] 96 % (10/05 0450) Weight:  [79.7 kg] 79.7 kg (10/04 0847) Physical Exam: General: Chronically ill appearing, temporal wasting. Alert and oriented in no acute distress.  Cardiovascular: Regular rate and rhythm with no murmurs, rubs, gallops.  Respiratory: Normal work of breathing on room air. Diminished breath sounds in R lung fields, particularly in the R base. Intact air movement on L. No focal wheezing, rales.  Abdomen: Some tenderness to palpation in the upper abdomen, most in the RUQ. Some hepatomegaly, however, patient reports pain is more in ribs/R side than RUQ.  Extremities: Warm and well perfused. Trace bilateral edema which is non pitting.   Laboratory: Most recent CBC Lab Results  Component Value Date   WBC 26.3 (H) 11/23/2022   HGB 9.4 (L) 11/23/2022   HCT 29.6 (L) 11/23/2022   MCV 80.4 11/23/2022   PLT 337 11/23/2022   Most recent BMP    Latest Ref Rng & Units 11/23/2022    5:38 AM  BMP  Glucose 70 - 99 mg/dL 81   BUN 8 - 23 mg/dL 30   Creatinine 8.29 - 1.24 mg/dL 5.62   Sodium 130 - 865 mmol/L 139   Potassium 3.5 - 5.1 mmol/L 4.0   Chloride 98 - 111 mmol/L 99   CO2 22 - 32 mmol/L 27   Calcium 8.9 - 10.3 mg/dL 8.4     Other Pertinent Labs:  Blood cultures with no growth at 48 hours Troponin: 58 > 25 Lactic Acid 1.0 UA negative UDS negative COVId negative TSH 0.915 BNP 165 HIV negative, Hepatitis panel non reactive  Imaging/Diagnostic Tests: CXR: Large right lung opacity is noted  concerning for pneumonia or atelectasis with associated pleural effusion. Underlying mass cannot be excluded.   CTA Chest IMPRESSION: 1. Small volume pulmonary emboli in the left lung lower lobe and inferior lingula. No CT evidence of right heart strain or lung infarction. 2. Moderate-sized loculated right pleural/fissural effusion with thickened and hyperattenuating pleura. Findings are favored to represent empyema. Consider thoracentesis and cytology evaluation to exclude underlying neoplastic process. 3. Patchy ground-glass changes with smooth interlobular septal thickening in the right upper lobe and patchy such areas throughout the left lung. There is also mosaic attenuation in the left lung. Findings are nonspecific but may represent multilobar pneumonia/atypical pneumonia with or without superimposed small airway disease. 4. Indeterminate lesion posterior to the right thyroid lobe. Consider nonemergent thyroid ultrasound. 5. Multiple other nonacute observations, as described above. 6. No discrete metastatic process identified within the abdomen or pelvis.   CT A/P:  Moderate-to-severe early enlarged liver measuring up to 22.4 cm in length. Non-cirrhotic configuration. No suspicious mass. There is a 5 mm hypoattenuating focus in the left hepatic dome, which is too small to adequately characterize no intrahepatic or extrahepatic bile duct dilation.   RUQ [place]: 1. Small amount of sludge within the gallbladder lumen. No gallbladder wall thickening or pericholecystic fluid. 2. There is a 7 mm hypoechoic mass in the left hepatic lobe, too  small to characterize. Recommend attention on follow-up.   Thyroid US: R lobe nodule 2 cm   [place]y [place], Medical Student 11/23/2022, 7:28 AM  PGY-, [facility] FPTS Intern pager: [phone], text pages welcome Secure chat group [facility] Teaching Service

## 2022-11-23 NOTE — Assessment & Plan Note (Addendum)
Small pulmonary embolism noted in LLL with no evidence of R heart strain or lung infarction on CT. Patient hemodynamically stable. Initiated on heparin gtt, however, heparin level undetectable on labwork 10/4, transitioned to SQ Lovenox BID per pharmacy recommendations on 10/4.  - CCM consulted, appreciate recommendations - Transition to Heparin GTT due to impending procedure of chest tube placement - DOAC on discharge.

## 2022-11-23 NOTE — Assessment & Plan Note (Addendum)
Improved R sided pleuritic pain after thoracentesis this morning. No other symptoms. Hemodynamically stable. Cardiopulmonary exam remarkable for diminished breath sounds on R side. Thoracentesis performed 10/4 with 100 mL of hazy fluid. Pleural fluid labs with LDH elevated to 5203, no organisms/WBC noted on gram stain, pleural glucose and protein WNL. Pleural cultures with no growth <24 hours. CXR post thoracentesis with persistent opacification through R hemithorax. Etiology of pleural effusion likely malignant vs infectious. Exudative effusion per Light's Criteria (LDH ratio > 0.5). Increased risk of TB from frequent methadone clinic visits, and patient with marked leukocytosis. However, indolent nature of symptoms consistent with malignancy. CHF exacerbation unlikely given unilateral loculated effusion, and no evidence of volume overload.  - Consult CCM/Pulmonology for potential chest tube placement - Follow up AFB Smear and Culture - Supplemental O2 PRN - Continue Abx coverage with Rocephin and Azithromycin

## 2022-11-23 NOTE — Consult Note (Signed)
NAME:  Stephen Lambert, MRN:  [id number], DOB:  1960-08-22, LOS: 1 ADMISSION DATE:  11/21/2022, CONSULTATION DATE:  10/5 REFERRING MD:  Linwood Dibbles, CHIEF COMPLAINT:  complicated pleural effusion    History of Present Illness:  62 year old male w/ hx as outlined below. Admitted 10/3 w/ 3 weeks of progressive SOB, abd discomfort, fatigue and wt loss. On eval in ER: wbc 32, CXR w/ near complete opacification of right hemithorax w/ F/U CT imaging noting: large loculated right pleural effusion w/ several areas of loculation, also pleural thickening, RLL ATX/collapse, R>L airspace disease AND small non-occlusive LLL Pulmonary emboli.  Was started on IV antibiotics, IV hydration, therapeutic LMWH. On 10/4 underwent diagnostic thora. Only able to pull hazy/yellow fluid. Pleural diagnostics were sent. PCCM asked to see for loculated pleural effusion on 10/5 Pertinent  Medical History  HTN, chronic pain on methadone, prior 1/2ppd smoker x 76yrs  Significant Hospital Events: Including procedures, antibiotic start and stop dates in addition to other pertinent events   10/3 admitted w/PNA, loculated right effusion and small non-occlusive left PE. Started on ABX and [facility] 10/4 right thoracentesis. removed. LDH 5203, protein 4, glucose < 20, cytology and AFB sent.  10/5 PCCM asked to eval given complicated exudative right effusion   Interim History / Subjective:  Feels a little better  Objective   Blood pressure 107/82, pulse 69, temperature 98.2 F (36.8 C), temperature source Oral, resp. rate 16, height 6\' 1"  (1.854 m), weight 79.7 kg, SpO2 98%.        Intake/Output Summary (Last 24 hours) at 11/23/2022 1049 Last data filed at 11/23/2022 0320 Gross per 24 hour  Intake 1112.72 ml  Output 425 ml  Net 687.72 ml   Filed Weights   11/21/22 0727 11/21/22 1200 11/22/22 0847  Weight: 97.5 kg 78.5 kg 79.7 kg    Examination: General: frail 62 yom resting in bed. No distress  HENT: +temporal  wasting poor dentition Lungs: crackles on left. Decreased on right Cardiovascular: RRR Abdomen: soft  Extremities: warm and dry no edema Neuro: oriented X 4 GU: voids  Resolved Hospital Problem list     Assessment & Plan:  Mod to large complicated Right pleural effusion.  PNA  Atelectasis  Small non-occlusive LLL PE Chronic pain Methadone use  Hepatomegaly  Tobacco abuse  Anemia AKI Thyroid mass   Pulm prob list  Mod to large complicated Right pleural effusion.  Dx eval raising concern for empyema but underlying malignancy not ruled out.  Plan Cont IV abx, change rocephin to Unasyn (has bad teeth) Change lmwh to iv heparin Will need Pleural drainage via small bore chest tube (we will do this tomorrow at bedside as he got his LMWH today). Ideally pleural lytics but intra pleural lytics and systemic AC carry a higher risk of bleeding so will see how it looks after drainage, we may consider having CVTS eval as well after CT as this might be a situation where VATS better option than bleeding risk from pleural lytics and AC  F/u serial CXRs Pain control  F/u cultures, AFB and cytology   Small PE  Plan Change LMWH to IV heparin in anticipation of CT  Wean O2 Pain control    Best Practice (right click and "Reselect all SmartList Selections" daily)  Per primary   Labs   CBC: Recent Labs  Lab 11/21/22 0750 11/21/22 0812 11/22/22 0401 11/23/22 0538  WBC 32.0*  --  27.8* 26.3*  NEUTROABS 26.4*  --  23.4*  --   HGB 10.1* 10.9* 9.4* 9.4*  HCT 31.7* 32.0* 28.6* 29.6*  MCV 80.7  --  81.3 80.4  PLT 333  --  322 337    Basic Metabolic Panel: Recent Labs  Lab 11/21/22 0750 11/21/22 0812 11/22/22 0401 11/22/22 1520 11/23/22 0538  NA 137 138 137  --  139  K 3.9 3.6 3.4*  --  4.0  CL 97*  --  98  --  99  CO2 24  --  28  --  27  GLUCOSE 92  --  91 77 81  BUN 34*  --  29*  --  30*  CREATININE 1.83*  --  1.49*  --  1.43*  CALCIUM 8.2*  --  8.0*  --  8.4*    GFR: Estimated Creatinine Clearance: 60.4 mL/min (A) (by C-G formula based on SCr of 1.43 mg/dL (H)). Recent Labs  Lab 11/21/22 0750 11/21/22 0952 11/21/22 1148 11/22/22 0401 11/23/22 0538  WBC 32.0*  --   --  27.8* 26.3*  LATICACIDVEN  --  1.0 1.0  --   --     Liver Function Tests: Recent Labs  Lab 11/21/22 0750  AST 21  ALT 18  ALKPHOS 108  BILITOT 0.7  PROT 5.8*  ALBUMIN 1.8*   Recent Labs  Lab 11/21/22 0750  LIPASE 18   Recent Labs  Lab 11/21/22 0751  AMMONIA 25    ABG    Component Value Date/Time   HCO3 27.7 11/21/2022 0812   TCO2 29 11/21/2022 0812   O2SAT 96 11/21/2022 0812     Coagulation Profile: Recent Labs  Lab 11/21/22 0750  INR 1.3*    Cardiac Enzymes: No results for input(s): "CKTOTAL", "CKMB", "CKMBINDEX", "TROPONINI" in the last 168 hours.  HbA1C: Hemoglobin A1C  Date/Time Value Ref Range Status  09/10/2019 02:20 PM 5.6 4.0 - 5.6 % Final    CBG: Recent Labs  Lab 11/21/22 0804  GLUCAP 101*    Review of Systems:   Review of Systems  Constitutional:  Positive for malaise/fatigue and weight loss.       60 lbs over last yr (not intentional)  HENT:         Poor dentition  No pain   Eyes: Negative.   Respiratory:  Positive for cough and shortness of breath.        Chest "fullness" on right   Cardiovascular: Negative.   Gastrointestinal: Negative.   Genitourinary: Negative.   Musculoskeletal: Negative.   Skin: Negative.   Neurological: Negative.   Endo/Heme/Allergies: Negative.   Psychiatric/Behavioral: Negative.       Past Medical History:  He,  has a past medical history of Back pain, chronic, Hernia, and HTN (hypertension).   Surgical History:   Past Surgical History:  Procedure Laterality Date   HERNIA REPAIR     1997   IR THORACENTESIS ASP PLEURAL SPACE W/IMG GUIDE  11/22/2022   LAPAROSCOPIC APPENDECTOMY N/A 11/29/2015   Procedure: APPENDECTOMY LAPAROSCOPIC;  Surgeon: Gaynelle Adu, MD;  Location: [facility] OR;   Service: General;  Laterality: N/A;     Social History:   reports that he has been smoking cigarettes. He has a 7.5 pack-year smoking history. He has never used smokeless tobacco. He reports current alcohol use. He reports current drug use. Drug: Marijuana.   Family History:  His family history includes Cancer in his mother; Diabetes in his father; Hypertension in his father.   Allergies No Known Allergies   Home Medications  Prior to Admission medications   Medication Sig Start Date End Date Taking? Authorizing Provider  amLODipine (NORVASC) 5 MG tablet TAKE 1 TABLET BY MOUTH EVERYDAY AT BEDTIME 10/28/22  Yes Maxwell, Allee, MD  lisinopril-hydrochlorothiazide (ZESTORETIC) 20-25 MG tablet Take 1 tablet by mouth daily. 11/06/22  Yes Alfredo Martinez, MD  methadone (DOLOPHINE) 10 MG tablet Take 80 mg by mouth daily.   Yes [provider]     Critical care time: NA

## 2022-11-24 ENCOUNTER — Inpatient Hospital Stay (HOSPITAL_COMMUNITY): Payer: Medicare Other

## 2022-11-24 DIAGNOSIS — J90 Pleural effusion, not elsewhere classified: Secondary | ICD-10-CM | POA: Diagnosis not present

## 2022-11-24 DIAGNOSIS — K5900 Constipation, unspecified: Secondary | ICD-10-CM | POA: Insufficient documentation

## 2022-11-24 LAB — BASIC METABOLIC PANEL
Anion gap: 9 (ref 5–15)
BUN: 27 mg/dL — ABNORMAL HIGH (ref 8–23)
CO2: 29 mmol/L (ref 22–32)
Calcium: 8.5 mg/dL — ABNORMAL LOW (ref 8.9–10.3)
Chloride: 99 mmol/L (ref 98–111)
Creatinine, Ser: 1.43 mg/dL — ABNORMAL HIGH (ref 0.61–1.24)
GFR, Estimated: 55 mL/min — ABNORMAL LOW (ref >60)
Glucose, Bld: 89 mg/dL (ref 70–99)
Potassium: 4.8 mmol/L (ref 3.5–5.1)
Sodium: 137 mmol/L (ref 135–145)

## 2022-11-24 LAB — CBC
HCT: 30.3 % — ABNORMAL LOW (ref 39.0–52.0)
Hemoglobin: 9.6 g/dL — ABNORMAL LOW (ref 13.0–17.0)
MCH: 25.5 pg — ABNORMAL LOW (ref 26.0–34.0)
MCHC: 31.7 g/dL (ref 30.0–36.0)
MCV: 80.6 fL (ref 80.0–100.0)
Platelets: 424 10*3/uL — ABNORMAL HIGH (ref 150–400)
RBC: 3.76 MIL/uL — ABNORMAL LOW (ref 4.22–5.81)
RDW: 14.4 % (ref 11.5–15.5)
WBC: 27.8 10*3/uL — ABNORMAL HIGH (ref 4.0–10.5)
nRBC: 0 % (ref 0.0–0.2)

## 2022-11-24 LAB — BODY FLUID CELL COUNT WITH DIFFERENTIAL

## 2022-11-24 LAB — HEPARIN LEVEL (UNFRACTIONATED)
Heparin Unfractionated: <0.1 [IU]/mL — ABNORMAL LOW (ref 0.30–0.70)
Heparin Unfractionated: <0.1 [IU]/mL — ABNORMAL LOW (ref 0.30–0.70)
Heparin Unfractionated: <0.1 [IU]/mL — ABNORMAL LOW (ref 0.30–0.70)

## 2022-11-24 LAB — ACID FAST SMEAR (AFB, MYCOBACTERIA): Acid Fast Smear: NEGATIVE

## 2022-11-24 LAB — APTT: aPTT: 46 s — ABNORMAL HIGH (ref 24–36)

## 2022-11-24 MED ORDER — SODIUM CHLORIDE (PF) 0.9 % IJ SOLN
10.0000 mg | Freq: Once | INTRAMUSCULAR | Status: AC
  Administered 2022-11-24: 10 mg via INTRAPLEURAL
  Filled 2022-11-24: qty 10

## 2022-11-24 MED ORDER — STERILE WATER FOR INJECTION IJ SOLN
5.0000 mg | Freq: Once | RESPIRATORY_TRACT | Status: AC
  Administered 2022-11-24: 5 mg via INTRAPLEURAL
  Filled 2022-11-24: qty 5

## 2022-11-24 MED ORDER — KETOROLAC TROMETHAMINE 30 MG/ML IJ SOLN: 30.0000 mg | Freq: Three times a day (TID) | INTRAMUSCULAR | Status: DC

## 2022-11-24 MED ORDER — POLYETHYLENE GLYCOL 3350 17 G PO PACK
17.0000 g | PACK | Freq: Every day | ORAL | Status: DC
  Administered 2022-11-25 – 2022-11-26 (×2): 17 g via ORAL
  Filled 2022-11-24 (×2): qty 1

## 2022-11-24 MED ORDER — SODIUM CHLORIDE 0.9 % IV SOLN
3.0000 g | Freq: Four times a day (QID) | INTRAVENOUS | Status: DC
  Administered 2022-11-24 – 2022-12-01 (×28): 3 g via INTRAVENOUS
  Filled 2022-11-24 (×28): qty 8

## 2022-11-24 MED ORDER — KETOROLAC TROMETHAMINE 30 MG/ML IJ SOLN
15.0000 mg | Freq: Three times a day (TID) | INTRAMUSCULAR | Status: DC
  Administered 2022-11-24 – 2022-11-26 (×6): 15 mg via INTRAVENOUS
  Filled 2022-11-24 (×6): qty 1

## 2022-11-24 MED ORDER — SODIUM CHLORIDE 0.9% FLUSH
10.0000 mL | Freq: Three times a day (TID) | INTRAVENOUS | Status: DC
  Administered 2022-11-24 – 2022-11-30 (×16): 10 mL via INTRAPLEURAL

## 2022-11-24 MED ORDER — ACETAMINOPHEN 325 MG PO TABS
650.0000 mg | ORAL_TABLET | Freq: Four times a day (QID) | ORAL | Status: DC | PRN
  Filled 2022-11-24: qty 2

## 2022-11-24 NOTE — Procedures (Signed)
Pleural Fibrinolytic Administration Procedure Note  Stephen Lambert  [id number]  07-27-1960  Date:11/24/22  Time:12:48 PM   Provider Performing:Dmitry Macomber Salena Saner Katrinka Blazing   Procedure: Pleural Fibrinolysis Initial day [id number])  Indication(s) Fibrinolysis of complicated pleural effusion  Consent Risks of the procedure as well as the alternatives and risks of each were explained to the patient and/or caregiver.  Consent for the procedure was obtained.   Anesthesia None   Time Out Verified patient identification, verified procedure, site/side was marked, verified correct patient position, special equipment/implants available, medications/allergies/relevant history reviewed, required imaging and test results available.   Sterile Technique Hand hygiene, gloves   Procedure Description Existing pleural catheter was cleaned and accessed in sterile manner.  10mg  of tPA in 30cc of saline and 5mg  of dornase in 30cc of sterile water were injected into pleural space using existing pleural catheter.  Catheter will be clamped for 1 hour and then placed back to suction.   Complications/Tolerance None; patient tolerated the procedure well.  EBL None   Specimen(s) None

## 2022-11-24 NOTE — Assessment & Plan Note (Addendum)
Patient leukocytosis continues today: WBC 27.8 (26.3).  Afebrile.  No cough/hemoptysis. Still with neutrophilic predominance. Blood cultures negative at 72 hours.  Likeliest etiology: malignancy +/- infection. - Continue ceftriaxone/azithromycin as above. - Follow-up tuberculosis labs. - Continue to follow blood cultures, negative thus far - Await fluid cultures.

## 2022-11-24 NOTE — Progress Notes (Signed)
Daily Progress Note Intern Pager: [phone]  Patient name: Stephen Lambert Medical record number: [id number] Date of birth: 08-Dec-1960 Age: 62 y.o. Gender: male  Primary Care Provider: Alfredo Martinez, MD Consultants: CCM, TCTS Code Status: DNR with previous interventions  Pt Overview and Major Events to Date:   Stephen Lambert is a 62 year old male with a past medical history of ODD, extensive tobacco use history (40 years), and hypertension who presents with 2-week history of weight loss and fatigue found to have pleural effusion and small left lower lobe pulmonary embolism.  Concern for malignancy versus aspiration pneumonia and, less likely, tuberculosis.  10/4: Admitted to the hospital.  Gertie Baron on ceftriaxone/azithromycin.  Thoracentesis returned 100 mL of hazy fluid.  Lovenox begun for PE.  Elevated LDH, otherwise no growth.  Exudative. TB labs drawn. 10/5: Bedside chest tube placement unsuccessful.  Heparin begun in setting up increased bleeding risk and potential need for intrapleural lytics. 10/6: Bedside chest tube placed, 250 mL of dense purulent fluid drainage.  Cultures sent. Assessment & Plan Pleural effusion CT scan placed chest tube, reduced to 250 mL of green turbid fluid.  Too dense for LDH/total protein.  Cultures sent.  Also administered pleural fibrinolytic (tPA 10 mg, dornase 5 mg), unremarkable course.  Restarted diet. - Lovenox held in setting of tPA administration. - Begun Toradol 15 mg every 8 hours and as needed Tylenol 650 mg every 6 hours for pain. - Blood and pleural cultures negative thus far.  Currently no growth for 2 to 3 days.  Recheck tomorrow. - Awaiting TB screen/culture - Continue supplemental O2 as needed - Continue ceftriaxone/azithromycin (10/4 -) Pulmonary embolism (HCC) Small PE and LLL, no cardiac complications.  Unprovoked.  Asymptomatic.  Could be hypercoagulable state in setting of malignancy.  Patient cachectic with bilateral temporal wasting.   7 mm hypoechoic mass in left hepatic lobe.  Will need outpatient follow-up for malignancy. - CCM recs 10/5 as follows: DOAC for 3 months on discharge.  +/- Lifelong AC with unprovoked PE. Potential need for intrapleural lytics. Age-appropriate cancer screening outpatient. Constipation In the setting of ongoing methadone treatment.  Currently not on any bowel regimen. - Begin MiraLAX daily ISO potential opioid-induced constipation. - Can consider senna/mag citrate if necessary and renal function allows. Leukocytosis Patient leukocytosis continues today: WBC 27.8 (26.3).  Afebrile.  No cough/hemoptysis. Still with neutrophilic predominance. Blood cultures negative at 72 hours.  Likeliest etiology: malignancy +/- infection. - Continue ceftriaxone/azithromycin as above. - Follow-up tuberculosis labs. - Continue to follow blood cultures, negative thus far - Await fluid cultures. Anemia Hgb: 9.6 (9.4.) as of 10/6.  Stable. - Etiology: Iron-deficiency anemia vs anemia of chronic disease. - Continue ferrous sulfate 325 mg PO daily.  Chronic and Stable Issues: Hepatomegaly: 7 mm hypoechoic mass in left hepatic lobe.  Will need outpatient follow-up.  LFTs within normal limits on admission.  Hepatitis/HIV labs negative.  Some tenderness. Hypertension: Continue home amlodipine 5 mg daily.  Originally held lisinopril/hydrochlorothiazide 20-25 daily due to AKI.  AKI appears to be resolved at this time.  Could consider restarting home meds, however, if anything, blood pressures have been a little on the softer side.  We will continue to hold for now. Methadone treatment: 80 mg daily.  Stable. Thyroid mass: 2 cm nodule found.  Normal TSH.  Follow-up outpatient. AKI, resolved: Creatinine returned to baseline 10/5 (~ 1.4-1.5).   FEN/GI: NPO, previously regular diet PPx: Heparin Dispo: Home pending chest tube placement/clinical improvement  Subjective:  No acute events overnight.  On interview, no  new complaints.  Has continued to have some constipation.  Discussed options for bowel regimen -- patient is on daily methadone, which could be contributing to constipation.   Objective:  BP: 117/63 HR: 74 RR: 18 T: Afebrile O2sat: 94% on 2 L [place].  Significant vitals over past 24 hours: WNL.  Physical Exam:  General: Chronically ill, temporal wasting.  Alert and oriented, NAD. Cardiovascular: RRR no m/r/g. Respiratory: Diminished breath sounds in right lung fields.  No wheezing/rales. Abdomen: Some right upper quadrant tenderness, as yesterday. BS+.  Extremities: ROM intact.  Nonedematous.  Basic labs:  Most recent CBC Lab Results  Component Value Date   WBC 27.8 (H) 11/24/2022   HGB 9.6 (L) 11/24/2022   HCT 30.3 (L) 11/24/2022   MCV 80.6 11/24/2022   PLT 424 (H) 11/24/2022   Most recent BMP    Latest Ref Rng & Units 11/24/2022    5:37 AM  BMP  Glucose 70 - 99 mg/dL 89   BUN 8 - 23 mg/dL 27   Creatinine 4.09 - 1.24 mg/dL 8.11   Sodium 914 - 782 mmol/L 137   Potassium 3.5 - 5.1 mmol/L 4.8   Chloride 98 - 111 mmol/L 99   CO2 22 - 32 mmol/L 29   Calcium 8.9 - 10.3 mg/dL 8.5     Other pertinent labs:  Heparin level: Less than 0.1 WBC: 27.8 (26.3) BUN: 27 (30) Creatinine: 1.43 (1.43) Blood cultures x 2 10/3: No growth for 3 days. Pleural fluid culture 10/4: No growth for 2 days.  Imaging/Diagnostic Tests:  CT chest with contrast (10/5):  IMPRESSION: 1. Moderate-to-large graft the multiloculated right pleural effusion with surrounding pleural thickening, little changed following 100 mL thoracentesis. 2. There is new demonstration of several small air bubbles in the dominant posterior component of the fluid. I suspect these were probably introduced at the time of thoracentesis but it is also possible the air in the fluid could be on an infectious basis or due to a bronchopleural fistula. 3. Likely little if any change in the scattered segmental and subsegmental  left lower lobe and lingular arterial emboli seen on the recent CTA, but this is not re-evaluated on this nondedicated exam. 4. Stable airspace/interstitial disease in the posterior segment of the right upper lobe abutting the fluid. Interval clearing of prior ground-glass opacities on the left. 5. Slightly prominent precarinal and subcarinal lymph nodes, unchanged. 6. Hepatic steatosis. 7. Aortic atherosclerosis. 8. Aberrant retroesophageal right subclavian artery. 9. 4.3 cm simple cyst superior pole right kidney.  Tomie China, MD 11/24/2022, 7:32 AM  PGY-1, [facility] FPTS Intern pager: [phone], text pages welcome Secure chat group [place] [facility] Teaching Service

## 2022-11-24 NOTE — Significant Event (Addendum)
Assisted with chest tube insertion, consent placed in chart. Per D. Smith MD hep gtt stopped and to remain off at this time. VSS pre/post procedure. documented immediately post. CT to suction per order. Sample walked down to lab.   Truddie Crumble, RN

## 2022-11-24 NOTE — Assessment & Plan Note (Addendum)
In the setting of ongoing methadone treatment.  Currently not on any bowel regimen. - Begin MiraLAX daily ISO potential opioid-induced constipation. - Can consider senna/mag citrate if necessary and renal function allows.

## 2022-11-24 NOTE — Progress Notes (Signed)
Total output since CT placed is 800 cc yellow/tan drainage which looks thick. CT to 20 cm suction.

## 2022-11-24 NOTE — Assessment & Plan Note (Signed)
Hgb: 9.6 (9.4.) as of 10/6.  Stable. - Etiology: Iron-deficiency anemia vs anemia of chronic disease. - Continue ferrous sulfate 325 mg PO daily.

## 2022-11-24 NOTE — Procedures (Signed)
Insertion of Chest Tube Procedure Note  JERAMIAH MCCAUGHEY  [id number]  Feb 16, 1961  Date:11/24/22  Time:11:29 AM    Provider Performing: Lorin Glass   Procedure: Pleural Catheter Insertion w/ Imaging Guidance ([id number])  Indication(s) Effusion  Consent Risks of the procedure as well as the alternatives and risks of each were explained to the patient and/or caregiver.  Consent for the procedure was obtained and is signed in the bedside chart  Anesthesia Topical only with 1% lidocaine    Time Out Verified patient identification, verified procedure, site/side was marked, verified correct patient position, special equipment/implants available, medications/allergies/relevant history reviewed, required imaging and test results available.   Sterile Technique Maximal sterile technique including full sterile barrier drape, hand hygiene, sterile gown, sterile gloves, mask, hair covering, sterile ultrasound probe cover (if used).   Procedure Description Ultrasound used to identify appropriate pleural anatomy for placement and overlying skin marked. Area of placement cleaned and draped in sterile fashion.  A 14 French pigtail pleural catheter was placed into the right pleural space using Seldinger technique. Appropriate return of pus was obtained.  The tube was connected to atrium and placed on -20 cm H2O wall suction.   Complications/Tolerance None; patient tolerated the procedure well. Chest X-ray is ordered to verify placement.   EBL Minimal  Specimen(s) fluid

## 2022-11-24 NOTE — Assessment & Plan Note (Addendum)
Small PE and LLL, no cardiac complications.  Unprovoked.  Asymptomatic.  Could be hypercoagulable state in setting of malignancy.  Patient cachectic with bilateral temporal wasting.  7 mm hypoechoic mass in left hepatic lobe.  Will need outpatient follow-up for malignancy. - CCM recs 10/5 as follows: DOAC for 3 months on discharge.  +/- Lifelong AC with unprovoked PE. Potential need for intrapleural lytics. Age-appropriate cancer screening outpatient.

## 2022-11-24 NOTE — Progress Notes (Signed)
PHARMACY - ANTICOAGULATION CONSULT NOTE  Pharmacy Consult for Heparin >> Lovenox >> now back to heparin Indication: pulmonary embolus  No Known Allergies  Patient Measurements: Height: 6\' 1"  (185.4 cm) Weight: 79.7 kg (175 lb 11.3 oz) IBW/kg (Calculated) : 79.9 Heparin Dosing Weight: TBW  Vital Signs: Temp: 99.1 F (37.3 C) (10/05 1944) Temp Source: Oral (10/05 1944) BP: 124/64 (10/05 1944) Pulse Rate: 75 (10/05 1944)  Labs: Recent Labs    11/21/22 0750 11/21/22 0812 11/21/22 0950 11/21/22 2102 11/22/22 0401 11/22/22 0651 11/22/22 2046 11/23/22 0538 11/24/22 0052  HGB 10.1* 10.9*  --   --  9.4*  --   --  9.4*  --   HCT 31.7* 32.0*  --   --  28.6*  --   --  29.6*  --   PLT 333  --   --   --  322  --   --  337  --   APTT  --   --   --   --   --   --   --   --  46*  LABPROT 16.1*  --   --   --   --   --   --   --   --   INR 1.3*  --   --   --   --   --   --   --   --   HEPARINUNFRC  --   --   --    < >  --  <0.10* <0.10*  --  <0.10*  CREATININE 1.83*  --   --   --  1.49*  --   --  1.43*  --   TROPONINIHS 58*  --  25*  --   --   --   --   --   --    < > = values in this interval not displayed.    Estimated Creatinine Clearance: 60.4 mL/min (A) (by C-G formula based on SCr of 1.43 mg/dL (H)).   Medical History: Past Medical History:  Diagnosis Date   Back pain, chronic    2007 fell off truck   Hernia    Rt. Groin - repaired 1997   HTN (hypertension)     Medications:  Scheduled:   amLODipine  5 mg Oral Daily   azithromycin  250 mg Oral Daily   ferrous sulfate  325 mg Oral Q breakfast   methadone  80 mg Oral Daily   pneumococcal 20-valent conjugate vaccine  0.5 mL Intramuscular Tomorrow-1000   Assessment: 62 YO male presented with three weeks of progressively worsening DOE, fatigue/malaise, and weight loss. PMH of HTN. CTA revealed small left lower lobe PE. Not on anticoagulant PTA. No bleeding noted, Hgb low in the 10s. Plts are normal.    Patient  transitioned from Heparin to LMWH due to persistently subtherapeutic heparin level. Received Lovenox 80 mg today at 1044. Now planning for chest tube for lytic administration, and CCM is consulting to switch back to UFH peri-procedurally. Will initiate heparin 4 hours prior to next lovenox dose - per protocol, to start at 1900. Will cautiously restart at last dose, but note that patient was with undetectable HL upon transition to lovenox. Will obtain both HL and aPTT due to recent Lovenox administration.  10/6 AM update:  Heparin level sub-therapeutic   Goal of Therapy:  Heparin level 0.3-0.7 units/mL Monitor platelets by anticoagulation protocol: Yes   Plan:  Inc heparin to 1950 units/hr 1100 heparin level  Abran Duke, PharmD, BCPS Clinical Pharmacist Phone: [phone]

## 2022-11-24 NOTE — Assessment & Plan Note (Addendum)
CT scan placed chest tube, reduced to 250 mL of green turbid fluid.  Too dense for LDH/total protein.  Cultures sent.  Also administered pleural fibrinolytic (tPA 10 mg, dornase 5 mg), unremarkable course.  Restarted diet. - Lovenox held in setting of tPA administration. - Begun Toradol 15 mg every 8 hours and as needed Tylenol 650 mg every 6 hours for pain. - Blood and pleural cultures negative thus far.  Currently no growth for 2 to 3 days.  Recheck tomorrow. - Awaiting TB screen/culture - Continue supplemental O2 as needed - Continue ceftriaxone/azithromycin (10/4 -)

## 2022-11-24 NOTE — Plan of Care (Signed)
Unclamped the CT after an hour of medicine placement by Dr. Katrinka Blazing. 400 cc yellow thick drainage came out.

## 2022-11-24 NOTE — Progress Notes (Addendum)
11/24/2022   I have seen and evaluated the patient for empyema   S:  No events.   O: Blood pressure 96/66, pulse 64, temperature 97.6 F (36.4 C), temperature source Oral, resp. rate 17, height 5\' 3"  (1.6 m), weight 81.6 kg, SpO2 94 %.    Thin man NAD Diminished breath sounds R: complex appearing R pleural space on [place] Moves to command AOx3   A:  R empyema Small PE burden Chronic pain   P:  - Pigtail to -20 - Start pleural lytics - DC airborne - DC heparin, can resume prior to DC (worry about hemorrhagic conversion if doing both pleural lytics + systemic AC) - Abx to unasyn to cover anaerobes - CXR in AM - If no great drainage, may need VATS Tuesday with Raquel James MD [facility] Prefer epic messenger for cross cover needs If after hours, please call E-link

## 2022-11-25 ENCOUNTER — Inpatient Hospital Stay (HOSPITAL_COMMUNITY): Payer: Medicare Other

## 2022-11-25 ENCOUNTER — Other Ambulatory Visit: Payer: Self-pay | Admitting: Acute Care

## 2022-11-25 DIAGNOSIS — J869 Pyothorax without fistula: Secondary | ICD-10-CM | POA: Diagnosis not present

## 2022-11-25 DIAGNOSIS — F1191 Opioid use, unspecified, in remission: Secondary | ICD-10-CM | POA: Insufficient documentation

## 2022-11-25 DIAGNOSIS — F17200 Nicotine dependence, unspecified, uncomplicated: Secondary | ICD-10-CM

## 2022-11-25 DIAGNOSIS — Z4682 Encounter for fitting and adjustment of non-vascular catheter: Secondary | ICD-10-CM | POA: Diagnosis not present

## 2022-11-25 DIAGNOSIS — J90 Pleural effusion, not elsewhere classified: Secondary | ICD-10-CM | POA: Diagnosis not present

## 2022-11-25 LAB — BASIC METABOLIC PANEL
Anion gap: 13 (ref 5–15)
BUN: 32 mg/dL — ABNORMAL HIGH (ref 8–23)
CO2: 24 mmol/L (ref 22–32)
Calcium: 8.4 mg/dL — ABNORMAL LOW (ref 8.9–10.3)
Chloride: 99 mmol/L (ref 98–111)
Creatinine, Ser: 1.51 mg/dL — ABNORMAL HIGH (ref 0.61–1.24)
GFR, Estimated: 52 mL/min — ABNORMAL LOW (ref >60)
Glucose, Bld: 94 mg/dL (ref 70–99)
Potassium: 4.4 mmol/L (ref 3.5–5.1)
Sodium: 136 mmol/L (ref 135–145)

## 2022-11-25 LAB — CBC
HCT: 30.1 % — ABNORMAL LOW (ref 39.0–52.0)
Hemoglobin: 9.4 g/dL — ABNORMAL LOW (ref 13.0–17.0)
MCH: 25.6 pg — ABNORMAL LOW (ref 26.0–34.0)
MCHC: 31.2 g/dL (ref 30.0–36.0)
MCV: 82 fL (ref 80.0–100.0)
Platelets: 433 10*3/uL — ABNORMAL HIGH (ref 150–400)
RBC: 3.67 MIL/uL — ABNORMAL LOW (ref 4.22–5.81)
RDW: 14.4 % (ref 11.5–15.5)
WBC: 25.4 10*3/uL — ABNORMAL HIGH (ref 4.0–10.5)
nRBC: 0 % (ref 0.0–0.2)

## 2022-11-25 MED ORDER — ENOXAPARIN SODIUM 80 MG/0.8ML IJ SOSY
80.0000 mg | PREFILLED_SYRINGE | Freq: Two times a day (BID) | INTRAMUSCULAR | Status: DC
  Administered 2022-11-25 – 2022-11-28 (×6): 80 mg via SUBCUTANEOUS
  Filled 2022-11-25 (×6): qty 0.8

## 2022-11-25 MED ORDER — STERILE WATER FOR INJECTION IJ SOLN
5.0000 mg | Freq: Once | RESPIRATORY_TRACT | Status: AC
  Administered 2022-11-25: 5 mg via INTRAPLEURAL
  Filled 2022-11-25: qty 5

## 2022-11-25 MED ORDER — ACETAMINOPHEN 325 MG PO TABS
650.0000 mg | ORAL_TABLET | Freq: Four times a day (QID) | ORAL | Status: DC | PRN
  Administered 2022-11-25 – 2022-11-26 (×3): 650 mg via ORAL
  Filled 2022-11-25 (×3): qty 2

## 2022-11-25 MED ORDER — SODIUM CHLORIDE (PF) 0.9 % IJ SOLN
10.0000 mg | Freq: Once | INTRAMUSCULAR | Status: AC
  Administered 2022-11-25: 10 mg via INTRAPLEURAL
  Filled 2022-11-25: qty 10

## 2022-11-25 NOTE — Assessment & Plan Note (Addendum)
Small pulmonary embolism in LLL, no evidence of R heart strain on CT. Appears unprovoked, however, high suspicion for hypercoagulable state due to potential malignancy. Patient hemodynamically stable this morning.  - CCM Consulted, appreciate recommendations:  - Start Lovenox, as patient subtherapeutic on heparin  - DOAC on discharge   - Age appropriate cancer screening on discharge

## 2022-11-25 NOTE — Assessment & Plan Note (Addendum)
Chest tube placed 10/6 by CCM, initially 250 mL of green turbid fluid. Overnight, additional 800 cc of thick yellow output. Pleural fibrinolytic administered 10/6. Patient with improved pleuritic symptoms since chest tube placement. Cultures with gram positive cocci. Acid fast smear negative, culture pending. Blood cultures with no growth at > 3 days. CXR this AM with slight improvement in R hemithorax opacification with no pneumothorax.  - CCM consulted, appreciate recommendations  - Repeat dose of pleural lytics today, per CCM   - Repeat CT scan to assess for residual fluid  - Continue Abx as below - Follow up pleural cultures - Follow up blood cultures - Follow up TB cultures - Continue Toradol 15 mg q8h PRN and Tylenol 650 mg q6h PRN for pain management  - Supplemental O2 as needed

## 2022-11-25 NOTE — Assessment & Plan Note (Signed)
Thyroid US with 2 cm nodule, recommended FNA biopsy. TSH WNL at 0.915. Patient with no tachycardia, weight changes.  - FNA biopsy outpatient

## 2022-11-25 NOTE — Assessment & Plan Note (Addendum)
Hgb stable this AM at 9.4. Iron studies/TIBC and ferritin consistent with anemia of chronic disease/IDA picture. Patient with no signs or symptoms consistent with active bleed.  - Continue PO ferrous sulfate 325 mg daily

## 2022-11-25 NOTE — Assessment & Plan Note (Addendum)
Hepatomegaly improved on exam. Abdominal exam benign this AM. Hepatitis panel and HIV Ab negative. No other symptoms. RUQ [place] with 7 mm hypoechoic mass in the left hepatic lobe. LFTs WNL (21/18).  - Continue to monitor for clinical signs/symptoms

## 2022-11-25 NOTE — Assessment & Plan Note (Addendum)
Downtrending this AM to 25.4, from 27.8 yesterday. Afebrile overnight, with no cough/hemoptysis. Still with neutrophilic predominance, blood cultures negative at 72 hours. Thoracentesis cultures with abundant gram positive cocci. Acid Fast smear negative. Likely etiology of leukocytosis is acute infection. Abx broadened to Unasyn for anaerobic coverage on 10/6 by CCM.  - CCM consulted, appreciate recommendations:  - Continue IV Unasyn  - Continue PO Azithromycin (10/4 - 10/7) - Follow up blood cultures - Follow up fluid cultures

## 2022-11-25 NOTE — Assessment & Plan Note (Deleted)
In the setting of ongoing methadone treatment.  Currently not on any bowel regimen. - Begin MiraLAX daily ISO potential opioid-induced constipation. - Can consider senna/mag citrate if necessary and renal function allows.

## 2022-11-25 NOTE — Plan of Care (Signed)

## 2022-11-25 NOTE — Progress Notes (Signed)
NAME:  Stephen Lambert, MRN:  [id number], DOB:  07/01/1960, LOS: 3 ADMISSION DATE:  11/21/2022, CONSULTATION DATE:  10/5 REFERRING MD:  Linwood Dibbles, CHIEF COMPLAINT:  complicated pleural effusion    History of Present Illness:  62 year old male w/ hx as outlined below. Admitted 10/3 w/ 3 weeks of progressive SOB, abd discomfort, fatigue and wt loss. On eval in ER: wbc 32, CXR w/ near complete opacification of right hemithorax w/ F/U CT imaging noting: large loculated right pleural effusion w/ several areas of loculation, also pleural thickening, RLL ATX/collapse, R>L airspace disease AND small non-occlusive LLL Pulmonary emboli.  Was started on IV antibiotics, IV hydration, therapeutic LMWH. On 10/4 underwent diagnostic thora. Only able to pull hazy/yellow fluid. Pleural diagnostics were sent. PCCM asked to see for loculated pleural effusion on 10/5 Pertinent  Medical History  HTN, chronic pain on methadone, prior 1/2ppd smoker x 50yrs  Significant Hospital Events: Including procedures, antibiotic start and stop dates in addition to other pertinent events   10/3 admitted w/PNA, loculated right effusion and small non-occlusive left PE. Started on ABX and [facility] 10/4 right thoracentesis. removed. LDH 5203, protein 4, glucose < 20, cytology and AFB sent.  10/5 PCCM asked to eval given complicated exudative right effusion   Interim History / Subjective:  Feels a little better  Objective   Blood pressure 104/62, pulse 63, temperature 98.2 F (36.8 C), temperature source Oral, resp. rate 16, height 6\' 1"  (1.854 m), weight 79.7 kg, SpO2 96%.        Intake/Output Summary (Last 24 hours) at 11/25/2022 0839 Last data filed at 11/25/2022 0600 Gross per 24 hour  Intake 840 ml  Output 2280 ml  Net -1440 ml   Filed Weights   11/21/22 0727 11/21/22 1200 11/22/22 0847  Weight: 97.5 kg 78.5 kg 79.7 kg   Chest tube output 980cc (atrium around 1030cc this morning)  Examination: General:   elderly man sitting up eating breakfast in NAD HENT: temporal wasting, otherwise normocephalic Lungs: improved air movement on the R with rhales, chest tube with purulent output. CTA on the left.  Cardiovascular: S1S2, RRR Abdomen: soft, NT  Extremities: no edema Neuro: awake, alert, moving all extremities  CXR personally reviewed> chest tube in place, mild improvement in aeration of R lung  Fluid culture: few WBC, abundant GPC Blood cultures NGTD  Resolved Hospital Problem list     Assessment & Plan:  R empyema adjacent to consolidated RLL- concern for aspiration pneumonia and parapneumonic effusion. Lung cancer is always a concern for family history and previous tobacco abuse. Very low suspicion for TB.  -2nd dose of TPA & dornase today; so far no bleeding complications -will repeat CT scan later today to assess for residual fluid since CXR may be misleading with pleural thickening and RLL consolidation -con't antibiotics- unasyn is appropriate given concern for anaerobes   Subsegmental PE- unprovoked -heparin -needs minimum 3 months of AC; need to consider lifelong AC with unprovoked VTE -OP needs age appropriate cancer screening  History of tobacco abuse -recommend referral for lung cancer screening after discharge -recommend ongoing cessation  Anemia -monitor for bleeding     Best Practice (right click and "Reselect all SmartList Selections" daily)  Per primary   Labs   CBC: Recent Labs  Lab 11/21/22 0750 11/21/22 0812 11/22/22 0401 11/23/22 0538 11/24/22 0537 11/25/22 0646  WBC 32.0*  --  27.8* 26.3* 27.8* 25.4*  NEUTROABS 26.4*  --  23.4*  --   --   --  HGB 10.1* 10.9* 9.4* 9.4* 9.6* 9.4*  HCT 31.7* 32.0* 28.6* 29.6* 30.3* 30.1*  MCV 80.7  --  81.3 80.4 80.6 82.0  PLT 333  --  322 337 424* 433*    Basic Metabolic Panel: Recent Labs  Lab 11/21/22 0750 11/21/22 0812 11/22/22 0401 11/22/22 1520 11/23/22 0538 11/24/22 0537  NA 137 138 137  --  139  137  K 3.9 3.6 3.4*  --  4.0 4.8  CL 97*  --  98  --  99 99  CO2 24  --  28  --  27 29  GLUCOSE 92  --  91 77 81 89  BUN 34*  --  29*  --  30* 27*  CREATININE 1.83*  --  1.49*  --  1.43* 1.43*  CALCIUM 8.2*  --  8.0*  --  8.4* 8.5*   GFR: Estimated Creatinine Clearance: 60.4 mL/min (A) (by C-G formula based on SCr of 1.43 mg/dL (H)). Recent Labs  Lab 11/21/22 0952 11/21/22 1148 11/22/22 0401 11/23/22 0538 11/24/22 0537 11/25/22 0646  WBC  --   --  27.8* 26.3* 27.8* 25.4*  LATICACIDVEN 1.0 1.0  --   --   --   --      Critical care time:     Steffanie Dunn, DO 11/25/22 8:40 AM [facility]  For contact information, see Amion. If no response to pager, please call PCCM consult pager. After hours, 7PM- 7AM, please call Elink.

## 2022-11-25 NOTE — Assessment & Plan Note (Addendum)
Baseline Cr 1.4-1.5. Cr uptrending this AM to 1.51 (1.43 yesterday). Suspect pre-renal as patient with slightly decreased intake yesterday. Only slightly elevated from baseline, patient asymptomatic.  - BMP in AM to trend renal function

## 2022-11-25 NOTE — Procedures (Signed)
Pleural Fibrinolytic Administration Procedure Note  Stephen Lambert  [id number]  [birth year]  Date:11/25/22  Time:12:28 PM   Provider Performing:Mort Smelser Blenda Bridegroom , NP  Procedure: Pleural Fibrinolysis Subsequent day [phone]) Day 2  Indication(s) Fibrinolysis of complicated pleural effusion  Consent Risks of the procedure as well as the alternatives and risks of each were explained to the patient and/or caregiver.  Consent for the procedure was obtained.   Anesthesia None   Time Out Verified patient identification, verified procedure, site/side was marked, verified correct patient position, special equipment/implants available, medications/allergies/relevant history reviewed, required imaging and test results available.   Sterile Technique Hand hygiene, gloves   Procedure Description Existing pleural catheter was cleaned and accessed in sterile manner.  10mg  of tPA in 30cc of saline and 5mg  of dornase in 30cc of sterile water were injected into pleural space using existing pleural catheter.  Catheter will be clamped for 1 hour and then placed back to suction.  Reviewed CBC, HGB stable No evidence of bleeding    Complications/Tolerance None; patient tolerated the procedure well.  EBL None   Specimen(s) None

## 2022-11-25 NOTE — Care Management Important Message (Signed)
Important Message  Patient Details  Name: Stephen Lambert MRN: [id number] Date of Birth: 01-19-1961   Important Message Given:  Yes - Medicare IM     Sherilyn Banker 11/25/2022, 2:23 PM

## 2022-11-25 NOTE — Progress Notes (Signed)
Daily Progress Note Intern Pager: [phone]  Patient name: Stephen Lambert Medical record number: [id number] Date of birth: [birth year] Age: 62 y.o. Gender: male  Primary Care Provider: Alfredo Martinez, MD Consultants: CCM/Pulmonology Code Status: DNR with interventions   Pt Overview and Major Events to Date:  10/4: Admitted to FMTS 10/6: Chest tube placed  Assessment and Plan:  Stephen Lambert is a 62 yo M with PMHx of OUD (in remission, on methadone), and hypertension who presents here with pleural effusion and small pulmonary embolism in LLL.  Assessment & Plan Pleural effusion Chest tube placed 10/6 by CCM, initially 250 mL of green turbid fluid. Overnight, additional 800 cc of thick yellow output. Pleural fibrinolytic administered 10/6. Patient with improved pleuritic symptoms since chest tube placement. Cultures with gram positive cocci. Acid fast smear negative, culture pending. Blood cultures with no growth at > 3 days. CXR this AM with slight improvement in R hemithorax opacification with no pneumothorax.  - CCM consulted, appreciate recommendations  - Repeat dose of pleural lytics today, per CCM   - Repeat CT scan to assess for residual fluid  - Continue Abx as below - Follow up pleural cultures - Follow up blood cultures - Follow up TB cultures - Continue Toradol 15 mg q8h PRN and Tylenol 650 mg q6h PRN for pain management  - Supplemental O2 as needed  Pulmonary embolism (HCC) Small pulmonary embolism in LLL, no evidence of R heart strain on CT. Appears unprovoked, however, high suspicion for hypercoagulable state due to potential malignancy. Patient hemodynamically stable this morning.  - CCM Consulted, appreciate recommendations:  - Start Lovenox, as patient subtherapeutic on heparin  - DOAC on discharge   - Age appropriate cancer screening on discharge Leukocytosis Downtrending this AM to 25.4, from 27.8 yesterday. Afebrile overnight, with no cough/hemoptysis.  Still with neutrophilic predominance, blood cultures negative at 72 hours. Thoracentesis cultures with abundant gram positive cocci. Acid Fast smear negative. Likely etiology of leukocytosis is acute infection. Abx broadened to Unasyn for anaerobic coverage on 10/6 by CCM.  - CCM consulted, appreciate recommendations:  - Continue IV Unasyn  - Continue PO Azithromycin (10/4 - 10/7) - Follow up blood cultures - Follow up fluid cultures  Anemia Hgb stable this AM at 9.4. Iron studies/TIBC and ferritin consistent with anemia of chronic disease/IDA picture. Patient with no signs or symptoms consistent with active bleed.  - Continue PO ferrous sulfate 325 mg daily Opioid use disorder in remission Well-controlled, on home methadone dose of 80 mg daily. Patient does report constipation during admission, suspect this is in the setting of ongoing methadone treatment. Has not taken Miralax yet, but willing to take today.  - Continue MiraLAX daily AKI (acute kidney injury) (HCC) Baseline Cr 1.4-1.5. Cr uptrending this AM to 1.51 (1.43 yesterday). Suspect pre-renal as patient with slightly decreased intake yesterday. Only slightly elevated from baseline, patient asymptomatic.  - BMP in AM to trend renal function  Thyroid mass Thyroid US with 2 cm nodule, recommended FNA biopsy. TSH WNL at 0.915. Patient with no tachycardia, weight changes.  - FNA biopsy outpatient Hepatomegaly Hepatomegaly improved on exam. Abdominal exam benign this AM. Hepatitis panel and HIV Ab negative. No other symptoms. RUQ [place] with 7 mm hypoechoic mass in the left hepatic lobe. LFTs WNL (21/18).  - Continue to monitor for clinical signs/symptoms   Chronic and Stable Issues: HTN: Holding home Amlodipine, lisinopril/hydrochlorothiazide 20-25 daily in setting of soft BP and AKI during admission  FEN/GI: Regular Diet PPx: Lovenox Dispo: Home pending clinical improvement   Subjective:  Patient is feeling improved this morning since  placement of chest tube. Feels like it is easier to take deep breaths, improved R sided pain. Is having some soreness at site of tube placement. No abdominal pain, nausea, vomiting, diarrhea, leg pain/swelling.   Objective: Temp:  [97.8 F (36.6 C)-98.9 F (37.2 C)] 98.2 F (36.8 C) (10/07 0448) Pulse Rate:  [63-83] 63 (10/07 0448) Resp:  [16-20] 16 (10/07 0448) BP: (104-127)/(55-64) 104/62 (10/07 0448) SpO2:  [91 %-98 %] 96 % (10/07 0448) Physical Exam: General: Chronically ill, temporal wasting. Alert and oriented NAD.  Cardiovascular: Heart rate and rhythm regular, no murmurs, rubs ,gallops.  Respiratory: Diminished breath sounds in the R lung fields, but much improved air movement since chest tube placement. No focal wheezing or rhonchi. Intact breath sounds and good air movement on L side.  Abdomen: No abdominal tenderness, distension, rebound or guarding.  Extremities: Warm and well-perfused. Trace bilateral edema that is non pitting. Intact pulses.   Laboratory: Most recent CBC Lab Results  Component Value Date   WBC 27.8 (H) 11/24/2022   HGB 9.6 (L) 11/24/2022   HCT 30.3 (L) 11/24/2022   MCV 80.6 11/24/2022   PLT 424 (H) 11/24/2022   Most recent BMP    Latest Ref Rng & Units 11/24/2022    5:37 AM  BMP  Glucose 70 - 99 mg/dL 89   BUN 8 - 23 mg/dL 27   Creatinine 9.14 - 1.24 mg/dL 7.82   Sodium 956 - 213 mmol/L 137   Potassium 3.5 - 5.1 mmol/L 4.8   Chloride 98 - 111 mmol/L 99   CO2 22 - 32 mmol/L 29   Calcium 8.9 - 10.3 mg/dL 8.5     Other pertinent labs: Blood cultures with no growth at > 3d Troponin: 58 > 25 Lactic Acid 1.0 UA negative UDS negative COVId negative TSH 0.915 BNP 165 HIV negative, Hepatitis panel non reactive  Pleural Fluid Cultures = abundant gram positive cocci  Imaging/Diagnostic Tests: CXR 10/6: 1. Interval placement of small bore right-sided chest tube with decreased size of right-sided pleural effusion. Persistent atelectasis and/or  consolidation throughout the right mid to lower lung.  CT chest with contrast (10/5):   IMPRESSION: 1. Moderate-to-large graft the multiloculated right pleural effusion with surrounding pleural thickening, little changed following 100 mL thoracentesis. 2. There is new demonstration of several small air bubbles in the dominant posterior component of the fluid. I suspect these were probably introduced at the time of thoracentesis but it is also possible the air in the fluid could be on an infectious basis or due to a bronchopleural fistula. 3. Likely little if any change in the scattered segmental and subsegmental left lower lobe and lingular arterial emboli seen on the recent CTA, but this is not re-evaluated on this nondedicated exam. 4. Stable airspace/interstitial disease in the posterior segment of the right upper lobe abutting the fluid. Interval clearing of prior ground-glass opacities on the left. 5. Slightly prominent precarinal and subcarinal lymph nodes, unchanged. 6. Hepatic steatosis. 7. Aortic atherosclerosis. 8. Aberrant retroesophageal right subclavian artery. 9. 4.3 cm simple cyst superior pole right kidney.  CT A/P:  Moderate-to-severe early enlarged liver measuring up to 22.4 cm in length. Non-cirrhotic configuration. No suspicious mass. There is a 5 mm hypoattenuating focus in the left hepatic dome, which is too small to adequately characterize no intrahepatic or extrahepatic bile duct dilation.   RUQ  [place]: 1. Small amount of sludge within the gallbladder lumen. No gallbladder wall thickening or pericholecystic fluid. 2. There is a 7 mm hypoechoic mass in the left hepatic lobe, too small to characterize. Recommend attention on follow-up.   Thyroid US: R lobe nodule 2 cm  [place], Medical Student 11/25/2022, 7:33 AM  PGY-, [facility] FPTS Intern pager: [phone], text pages welcome Secure chat group [facility]  Teaching Service

## 2022-11-25 NOTE — Assessment & Plan Note (Deleted)
Well-controlled. Home dose is 80mg  daily. - Continue Methadone 80 mg daily

## 2022-11-25 NOTE — Assessment & Plan Note (Signed)
Well-controlled, on home methadone dose of 80 mg daily. Patient does report constipation during admission, suspect this is in the setting of ongoing methadone treatment. Has not taken Miralax yet, but willing to take today.  - Continue MiraLAX daily

## 2022-11-25 NOTE — Progress Notes (Signed)
PHARMACY - ANTICOAGULATION CONSULT NOTE  Pharmacy Consult for Lovenox  Indication:  pulmonary embolus  No Known Allergies  Patient Measurements: Height: 6\' 1"  (185.4 cm) Weight: 79.7 kg (175 lb 11.3 oz) IBW/kg (Calculated) : 79.9  Vital Signs: Temp: 98.2 F (36.8 C) (10/07 0448) Temp Source: Oral (10/07 0448) BP: 104/62 (10/07 0448) Pulse Rate: 63 (10/07 0448)  Labs: Recent Labs    11/23/22 0538 11/24/22 0052 11/24/22 0537 11/24/22 1058 11/25/22 0646  HGB 9.4*  --  9.6*  --  9.4*  HCT 29.6*  --  30.3*  --  30.1*  PLT 337  --  424*  --  433*  APTT  --  46*  --   --   --   HEPARINUNFRC  --  <0.10* <0.10* <0.10*  --   CREATININE 1.43*  --  1.43*  --  1.51*    Estimated Creatinine Clearance: 57.2 mL/min (A) (by C-G formula based on SCr of 1.51 mg/dL (H)).   Medical History: Past Medical History:  Diagnosis Date   Back pain, chronic    2007 fell off truck   Hernia    Rt. Groin - repaired 1997   HTN (hypertension)     Medications:  Scheduled:   ferrous sulfate  325 mg Oral Q breakfast   ketorolac  15 mg Intravenous Q8H   methadone  80 mg Oral Daily   pneumococcal 20-valent conjugate vaccine  0.5 mL Intramuscular Tomorrow-1000   polyethylene glycol  17 g Oral Daily   sodium chloride flush  10 mL Intrapleural Q8H   PRN: acetaminophen  Assessment: 62 YO male presented with three weeks of progressively worsening DOE, fatigue/malaise, and weight loss. On 10/3 started heparin gtt but each time heparin level has resulted subtherapeutic <0.1. On 10/4 >10/5 switched to Lovenox, which was stopped for thoracentesis. Thoracentesis and chest tube placement on 10/6 with no bleeding complications. Hgb in the 9s. Plts are normal.   Goal of Therapy:  Anti-Xa level 0.6-1 units/ml 4hrs after LMWH dose given Monitor platelets by anticoagulation protocol: Yes   Plan:  Lovenox 80 mg subq q12h Monitor for signs and symptoms of bleeding At discharge, potential for switch to  DOAC  Alesia Banda, PharmD Candidate Class of 2025 11/25/2022,2:44 PM

## 2022-11-26 ENCOUNTER — Inpatient Hospital Stay (HOSPITAL_COMMUNITY): Payer: Medicare Other

## 2022-11-26 DIAGNOSIS — J90 Pleural effusion, not elsewhere classified: Secondary | ICD-10-CM | POA: Diagnosis not present

## 2022-11-26 LAB — BASIC METABOLIC PANEL
Anion gap: 10 (ref 5–15)
BUN: 36 mg/dL — ABNORMAL HIGH (ref 8–23)
CO2: 28 mmol/L (ref 22–32)
Calcium: 8.2 mg/dL — ABNORMAL LOW (ref 8.9–10.3)
Chloride: 99 mmol/L (ref 98–111)
Creatinine, Ser: 1.47 mg/dL — ABNORMAL HIGH (ref 0.61–1.24)
GFR, Estimated: 54 mL/min — ABNORMAL LOW (ref >60)
Glucose, Bld: 82 mg/dL (ref 70–99)
Potassium: 4.1 mmol/L (ref 3.5–5.1)
Sodium: 137 mmol/L (ref 135–145)

## 2022-11-26 LAB — CBC
HCT: 30 % — ABNORMAL LOW (ref 39.0–52.0)
Hemoglobin: 9.6 g/dL — ABNORMAL LOW (ref 13.0–17.0)
MCH: 26.4 pg (ref 26.0–34.0)
MCHC: 32 g/dL (ref 30.0–36.0)
MCV: 82.4 fL (ref 80.0–100.0)
Platelets: 453 10*3/uL — ABNORMAL HIGH (ref 150–400)
RBC: 3.64 MIL/uL — ABNORMAL LOW (ref 4.22–5.81)
RDW: 14.4 % (ref 11.5–15.5)
WBC: 21.9 10*3/uL — ABNORMAL HIGH (ref 4.0–10.5)
nRBC: 0 % (ref 0.0–0.2)

## 2022-11-26 LAB — CYTOLOGY - NON PAP

## 2022-11-26 LAB — CULTURE, BLOOD (ROUTINE X 2)
Culture: NO GROWTH
Culture: NO GROWTH

## 2022-11-26 MED ORDER — ACETAMINOPHEN 325 MG PO TABS
650.0000 mg | ORAL_TABLET | Freq: Four times a day (QID) | ORAL | Status: DC
  Administered 2022-11-26 – 2022-12-02 (×14): 650 mg via ORAL
  Filled 2022-11-26 (×18): qty 2

## 2022-11-26 MED ORDER — KETOROLAC TROMETHAMINE 30 MG/ML IJ SOLN
15.0000 mg | Freq: Three times a day (TID) | INTRAMUSCULAR | Status: AC | PRN
  Administered 2022-11-27 – 2022-11-29 (×4): 15 mg via INTRAVENOUS
  Filled 2022-11-26 (×4): qty 1

## 2022-11-26 NOTE — Progress Notes (Signed)
New chest tube supplies was requested, kept in the pt's room for possible drain replacement tonight.

## 2022-11-26 NOTE — Progress Notes (Addendum)
Daily Progress Note Intern Pager: [phone]  Patient name: Stephen Lambert Medical record number: [id number] Date of birth: 09/24/1960 Age: 62 y.o. Gender: male  Primary Care Provider: Alfredo Martinez, MD Consultants: CCM/Pulmonology Code Status: DNR with interventions   Pt Overview and Major Events to Date:  10/4: Admitted to FMTS 10/6: Chest tube placed  Assessment and Plan:  Stephen Lambert is a 41 yo M with PMHx of OUD (in remission, on methadone), and hypertension who presents here with pleural effusion and small pulmonary embolism in LLL.   Assessment & Plan Pleural effusion Chest tube placed 10/6 by CCM, initially with yellow, thick output. Patient with improved symptoms since placement. In last 24 hours, 250 cc of serosanguinous output. Pleural fibrinolytic administered 10/6 and 10/7. Pleural cultures with strep intermedius, susceptibilities pending. Acid fast smear negative, culture pending. Blood cultures with no growth at > 4 days.  - CCM consulted, appreciate recommendations - Consider third dose of pleural lytics, final recommendations pending  - Follow up CT scan to assess for residual fluid  - Continue Abx as below - Follow up pleural cultures - Follow up blood cultures - Follow up TB cultures - Toradol 15 mg q8h PRN - Tylenol 650 mg q6h scheduled - Supplemental O2 as needed  Pulmonary embolism (HCC) Small pulmonary embolism in LLL, no evidence of R heart strain on CT. Appears unprovoked, however, high suspicion for hypercoagulable state due to potential malignancy. Patient hemodynamically stable this morning.  - CCM Consulted, appreciate recommendations:  - Start Lovenox, as patient subtherapeutic on heparin  - DOAC on discharge   - Age appropriate cancer screening on discharge Leukocytosis Downtrending this AM to 21.9, from 25.4 yesterday. Afebrile overnight, with no cough/hemoptysis. Blood cultures negative >3d. Thoracentesis cultures with abundant gram  positive cocci. Acid Fast smear negative. Likely etiology of leukocytosis is acute infection. Abx broadened to Unasyn for anaerobic coverage on 10/6 by CCM.  - CCM consulted, appreciate recommendations:  - Continue IV Unasyn  - Continue PO Azithromycin (10/4 - 10/7) - Follow up blood cultures - Follow up fluid cultures  Anemia Hgb stable this AM at 9.6. Iron studies/TIBC and ferritin consistent with anemia of chronic disease/IDA picture. Patient with no signs or symptoms consistent with active bleed.  - Continue PO ferrous sulfate 325 mg daily Opioid use disorder in remission Well-controlled, on home methadone dose of 80 mg daily. Patient does report constipation during admission, suspect this is in the setting of ongoing methadone treatment. S/P 1 dose of Miralax yesterday, has not had BM, but feels like he may have one soon. No abdominal pain, nausea.  - Continue MiraLAX daily AKI (acute kidney injury) (HCC) Baseline Cr 1.4-1.5. Cr stable this morning at 1.47. Only slightly elevated from baseline, patient asymptomatic.  - BMP in AM to trend renal function  Thyroid mass Thyroid US with 2 cm nodule, recommended FNA biopsy. TSH WNL at 0.915. Patient with no tachycardia, weight changes.  - FNA biopsy outpatient Hepatomegaly Hepatomegaly improved on exam. Abdominal exam benign this AM. Hepatitis panel and HIV Ab negative. No other symptoms. RUQ [place] with 7 mm hypoechoic mass in the left hepatic lobe. LFTs WNL (21/18).  - Continue to monitor for clinical signs/symptoms    Chronic and Stable Issues: HTN: Holding home Amlodipine, lisinopril/hydrochlorothiazide 20-25 daily in setting of soft BP and AKI during admission   FEN/GI: Regular Diet PPx: Lovenox Dispo: Home pending clinical improvement   Subjective:  Patient is feeling improved this morning.  Feels like it is easier to take deep breaths, improved R sided pain. Is still having some soreness at site of tube placement. No other symptoms,  no abdominal pain, nausea, vomiting, diarrhea, leg pain/swelling.   Objective: Temp:  [98.3 F (36.8 C)-98.7 F (37.1 C)] 98.5 F (36.9 C) (10/08 0443) Pulse Rate:  [61-66] 61 (10/08 0443) Resp:  [17-18] 18 (10/08 0443) BP: (98-111)/(58-68) 111/68 (10/08 0443) SpO2:  [93 %-97 %] 97 % (10/08 0443) Weight:  [86.5 kg] 86.5 kg (10/08 0443) Physical Exam: General: Chronically ill-appearing, temporal wasting. Alert and oriented, NAD.  Cardiovascular: 2/6 systolic flow ejection murmur noted, rate regular. No other murmurs, rubs, or gallops.  Respiratory: Diminished breath sounds in the R lung fields, but much improved air movement since chest tube placement. No focal wheezing or rhonchi. Intact breath sounds and good air movement on L side.  Abdomen: No abdominal tenderness, rebound, guarding, or distension.  Extremities: Warm and well-perfused. Trace bilateral edema that is non pitting. Intact pulses.   Laboratory: Most recent CBC Lab Results  Component Value Date   WBC 21.9 (H) 11/26/2022   HGB 9.6 (L) 11/26/2022   HCT 30.0 (L) 11/26/2022   MCV 82.4 11/26/2022   PLT 453 (H) 11/26/2022   Most recent BMP    Latest Ref Rng & Units 11/26/2022    5:17 AM  BMP  Glucose 70 - 99 mg/dL 82   BUN 8 - 23 mg/dL 36   Creatinine 1.47 - 1.24 mg/dL 8.29   Sodium 562 - 130 mmol/L 137   Potassium 3.5 - 5.1 mmol/L 4.1   Chloride 98 - 111 mmol/L 99   CO2 22 - 32 mmol/L 28   Calcium 8.9 - 10.3 mg/dL 8.2     Other pertinent labs: Blood cultures with no growth at > 4d Troponin: 58 > 25 Lactic Acid 1.0 UA negative UDS negative COVId negative TSH 0.915 BNP 165 HIV negative, Hepatitis panel non reactive  Pleural Fluid Cultures = abundant gram positive cocci   Imaging/Diagnostic Tests: CXR 10/6: 1. Interval placement of small bore right-sided chest tube with decreased size of right-sided pleural effusion. Persistent atelectasis and/or consolidation throughout the right mid to lower lung.    CT chest with contrast (10/5):   IMPRESSION: 1. Moderate-to-large graft the multiloculated right pleural effusion with surrounding pleural thickening, little changed following 100 mL thoracentesis. 2. There is new demonstration of several small air bubbles in the dominant posterior component of the fluid. I suspect these were probably introduced at the time of thoracentesis but it is also possible the air in the fluid could be on an infectious basis or due to a bronchopleural fistula. 3. Likely little if any change in the scattered segmental and subsegmental left lower lobe and lingular arterial emboli seen on the recent CTA, but this is not re-evaluated on this nondedicated exam. 4. Stable airspace/interstitial disease in the posterior segment of the right upper lobe abutting the fluid. Interval clearing of prior ground-glass opacities on the left. 5. Slightly prominent precarinal and subcarinal lymph nodes, unchanged. 6. Hepatic steatosis. 7. Aortic atherosclerosis. 8. Aberrant retroesophageal right subclavian artery. 9. 4.3 cm simple cyst superior pole right kidney.   CT A/P:  Moderate-to-severe early enlarged liver measuring up to 22.4 cm in length. Non-cirrhotic configuration. No suspicious mass. There is a 5 mm hypoattenuating focus in the left hepatic dome, which is too small to adequately characterize no intrahepatic or extrahepatic bile duct dilation.   RUQ [place]: 1. Small amount of  sludge within the gallbladder lumen. No gallbladder wall thickening or pericholecystic fluid. 2. There is a 7 mm hypoechoic mass in the left hepatic lobe, too small to characterize. Recommend attention on follow-up.   Thyroid US: R lobe nodule 2 cm   [place], Medical Student 11/26/2022, 7:42 AM   I was personally present and performed or re-performed the history, physical exam and medical decision making activities of this service and have verified that the service and findings  are accurately documented in the student's note.  Obtain echo for systolic murmur. ?flow related to anemia. Reassuringly stable. Documented on admission as well by EDP. Appears echo prior to 2014 per previous notes, but I am unable to see the indication/results.  Janeal Holmes, MD                  11/26/2022, 1:14 PM PGY-2, [facility] FPTS Intern pager: [phone], text pages welcome Secure chat group [facility] Teaching Service

## 2022-11-26 NOTE — Assessment & Plan Note (Addendum)
Small pulmonary embolism in LLL, no evidence of R heart strain on CT. Appears unprovoked, however, high suspicion for hypercoagulable state due to potential malignancy. Patient hemodynamically stable this morning.  - CCM Consulted, appreciate recommendations:  - Start Lovenox, as patient subtherapeutic on heparin  - DOAC on discharge   - Age appropriate cancer screening on discharge

## 2022-11-26 NOTE — Assessment & Plan Note (Addendum)
Hgb stable this AM at 9.6. Iron studies/TIBC and ferritin consistent with anemia of chronic disease/IDA picture. Patient with no signs or symptoms consistent with active bleed.  - Continue PO ferrous sulfate 325 mg daily

## 2022-11-26 NOTE — Assessment & Plan Note (Addendum)
Chest tube placed 10/6 by CCM, initially with yellow, thick output. Patient with improved symptoms since placement. In last 24 hours, 250 cc of serosanguinous output. Pleural fibrinolytic administered 10/6 and 10/7. Pleural cultures with strep intermedius, susceptibilities pending. Acid fast smear negative, culture pending. Blood cultures with no growth at > 4 days.  - CCM consulted, appreciate recommendations - Consider third dose of pleural lytics, final recommendations pending  - Follow up CT scan to assess for residual fluid  - Continue Abx as below - Follow up pleural cultures - Follow up blood cultures - Follow up TB cultures - Toradol 15 mg q8h PRN - Tylenol 650 mg q6h scheduled - Supplemental O2 as needed

## 2022-11-26 NOTE — Progress Notes (Signed)
NAME:  Stephen Lambert, MRN:  [id number], DOB:  [birth year], LOS: 4 ADMISSION DATE:  11/21/2022, CONSULTATION DATE:  10/5 REFERRING MD:  Linwood Dibbles, CHIEF COMPLAINT:  complicated pleural effusion    History of Present Illness:  62 year old male w/ hx as outlined below. Admitted 10/3 w/ 3 weeks of progressive SOB, abd discomfort, fatigue and wt loss. On eval in ER: wbc 32, CXR w/ near complete opacification of right hemithorax w/ F/U CT imaging noting: large loculated right pleural effusion w/ several areas of loculation, also pleural thickening, RLL ATX/collapse, R>L airspace disease AND small non-occlusive LLL Pulmonary emboli.  Was started on IV antibiotics, IV hydration, therapeutic LMWH. On 10/4 underwent diagnostic thora. Only able to pull hazy/yellow fluid. Pleural diagnostics were sent. PCCM asked to see for loculated pleural effusion on 10/5 Pertinent  Medical History  HTN, chronic pain on methadone, prior 1/2ppd smoker x 39yrs  Significant Hospital Events: Including procedures, antibiotic start and stop dates in addition to other pertinent events   10/3 admitted w/PNA, loculated right effusion and small non-occlusive left PE. Started on ABX and [facility] 10/4 right thoracentesis. removed. LDH 5203, protein 4, glucose < 20, cytology and AFB sent.  10/5 PCCM asked to eval given complicated exudative right effusion   Interim History / Subjective:  Feels a little better  Objective   Blood pressure 111/65, pulse 60, temperature 97.6 F (36.4 C), temperature source Oral, resp. rate 14, height 6\' 1"  (1.854 m), weight 86.5 kg, SpO2 95%.        Intake/Output Summary (Last 24 hours) at 11/26/2022 1029 Last data filed at 11/26/2022 0852 Gross per 24 hour  Intake 430.06 ml  Output 790 ml  Net -359.94 ml   Filed Weights   11/21/22 1200 11/22/22 0847 11/26/22 0443  Weight: 78.5 kg 79.7 kg 86.5 kg   Chest tube output 980cc (atrium around 1030cc this morning)  Examination: 62 year old  male in no acute distress. Right chest tube is in place no airleak is noted currently on 20 cm suction with 270 cc of bloody drainage over the last 24 hours Heart sounds are regular Abdomen soft nontender Extremities are warm without edema Resolved Hospital Problem list     Assessment & Plan:  R empyema adjacent to consolidated RLL- concern for aspiration pneumonia and parapneumonic effusion. Lung cancer is always a concern for family history and previous tobacco abuse. Very low suspicion for TB.  -2nd dose of TPA & dornase 10/7; so far no bleeding complications  CT scan of the chest has been repeated will evaluate Questionable need for third dose of lytics per MD Questionable discontinuation of chest tube Continue current antimicrobial therapy   Subsegmental PE unprovoked Continue heparin Will need 3 months of anticoagulation before reevaluation He will need cancer evaluation with his history   History of tobacco abuse Recommend cessation  Anemia Recent Labs    11/25/22 0646 11/26/22 0517  HGB 9.4* 9.6*   Transfuse per protocol Monitor for bleeding     Best Practice (right click and "Reselect all SmartList Selections" daily)  Per primary   Labs   CBC: Recent Labs  Lab 11/21/22 0750 11/21/22 0812 11/22/22 0401 11/23/22 0538 11/24/22 0537 11/25/22 0646 11/26/22 0517  WBC 32.0*  --  27.8* 26.3* 27.8* 25.4* 21.9*  NEUTROABS 26.4*  --  23.4*  --   --   --   --   HGB 10.1*   < > 9.4* 9.4* 9.6* 9.4* 9.6*  HCT 31.7*   < >  28.6* 29.6* 30.3* 30.1* 30.0*  MCV 80.7  --  81.3 80.4 80.6 82.0 82.4  PLT 333  --  322 337 424* 433* 453*   < > = values in this interval not displayed.    Basic Metabolic Panel: Recent Labs  Lab 11/22/22 0401 11/22/22 1520 11/23/22 0538 11/24/22 0537 11/25/22 0646 11/26/22 0517  NA 137  --  139 137 136 137  K 3.4*  --  4.0 4.8 4.4 4.1  CL 98  --  99 99 99 99  CO2 28  --  27 29 24 28   GLUCOSE 91 77 81 89 94 82  BUN 29*  --  30*  27* 32* 36*  CREATININE 1.49*  --  1.43* 1.43* 1.51* 1.47*  CALCIUM 8.0*  --  8.4* 8.5* 8.4* 8.2*   GFR: Estimated Creatinine Clearance: 58.9 mL/min (A) (by C-G formula based on SCr of 1.47 mg/dL (H)). Recent Labs  Lab 11/21/22 0952 11/21/22 1148 11/22/22 0401 11/23/22 0538 11/24/22 0537 11/25/22 0646 11/26/22 0517  WBC  --   --    < > 26.3* 27.8* 25.4* 21.9*  LATICACIDVEN 1.0 1.0  --   --   --   --   --    < > = values in this interval not displayed.     Brett Canales Rennie Rouch ACNP Acute Care Nurse Practitioner [facility] Pulmonary/Critical Care Please consult Amion 11/26/2022, 10:30 AM

## 2022-11-26 NOTE — Assessment & Plan Note (Addendum)
Hepatomegaly improved on exam. Abdominal exam benign this AM. Hepatitis panel and HIV Ab negative. No other symptoms. RUQ [place] with 7 mm hypoechoic mass in the left hepatic lobe. LFTs WNL (21/18).  - Continue to monitor for clinical signs/symptoms

## 2022-11-26 NOTE — Assessment & Plan Note (Addendum)
Thyroid US with 2 cm nodule, recommended FNA biopsy. TSH WNL at 0.915. Patient with no tachycardia, weight changes.  - FNA biopsy outpatient

## 2022-11-26 NOTE — Assessment & Plan Note (Addendum)
Well-controlled, on home methadone dose of 80 mg daily. Patient does report constipation during admission, suspect this is in the setting of ongoing methadone treatment. S/P 1 dose of Miralax yesterday, has not had BM, but feels like he may have one soon. No abdominal pain, nausea.  - Continue MiraLAX daily

## 2022-11-26 NOTE — Assessment & Plan Note (Addendum)
Downtrending this AM to 21.9, from 25.4 yesterday. Afebrile overnight, with no cough/hemoptysis. Blood cultures negative >3d. Thoracentesis cultures with abundant gram positive cocci. Acid Fast smear negative. Likely etiology of leukocytosis is acute infection. Abx broadened to Unasyn for anaerobic coverage on 10/6 by CCM.  - CCM consulted, appreciate recommendations:  - Continue IV Unasyn  - Continue PO Azithromycin (10/4 - 10/7) - Follow up blood cultures - Follow up fluid cultures

## 2022-11-26 NOTE — Plan of Care (Addendum)
Pt alert and oriented x 4. Up with assist due to Chest tube. Pt has received 1 dose of prn tylenol. Scheduled toradol given for pain per order. Dsg changed this am. Same dressing reapplied which was tegaderm antimicrob patch with sutures intact and attached to antimicrobial patch. On call PA made aware of change in drainage color from yellow to serosanguinous to dark red/bloody. Output 130 so far this shift. Notified to call if over 500 or if significant change in output. Vitals stable. BS x 4. Last bm over 72 hours 10/5 Pt reported he received mira lax 10/7. Edema +2 to BLE. Elevated on pillows. Foot of bed elevated. Pulses + Problem: Education: Goal: Knowledge of General Education information will improve Description: Including pain rating scale, medication(s)/side effects and non-pharmacologic comfort measures Outcome: Progressing   Problem: Health Behavior/Discharge Planning: Goal: Ability to manage health-related needs will improve Outcome: Progressing   Problem: Clinical Measurements: Goal: Ability to maintain clinical measurements within normal limits will improve Outcome: Progressing Goal: Will remain free from infection Outcome: Progressing Goal: Diagnostic test results will improve Outcome: Progressing Goal: Respiratory complications will improve Outcome: Progressing Goal: Cardiovascular complication will be avoided Outcome: Progressing   Problem: Activity: Goal: Risk for activity intolerance will decrease Outcome: Progressing   Problem: Nutrition: Goal: Adequate nutrition will be maintained Outcome: Progressing   Problem: Coping: Goal: Level of anxiety will decrease Outcome: Progressing   Problem: Elimination: Goal: Will not experience complications related to bowel motility Outcome: Progressing Goal: Will not experience complications related to urinary retention Outcome: Progressing   Problem: Pain Managment: Goal: General experience of comfort will  improve Outcome: Progressing   Problem: Safety: Goal: Ability to remain free from injury will improve Outcome: Progressing   Problem: Skin Integrity: Goal: Risk for impaired skin integrity will decrease Outcome: Progressing

## 2022-11-26 NOTE — Assessment & Plan Note (Addendum)
Baseline Cr 1.4-1.5. Cr stable this morning at 1.47. Only slightly elevated from baseline, patient asymptomatic.  - BMP in AM to trend renal function

## 2022-11-26 NOTE — Progress Notes (Signed)
Phone call placed due to bloody drainage from Chest tube. Change in color from previous shift. Pt did receive cathflo at 1pm 10/7. PA aware with call. Continue to monitor and notify if over 500 in 1 hour or significant increase in output.   11/26/22 0300  Chest Tube 1 Right Pleural  Placement Date/Time: 11/24/22 1121   Tube Number: 1  Chest Tube Orientation: Right  Chest Tube Location: Pleural  Chest Tube Type: Pigtail - Cook Catheter  Chest Tube Drainage System: Suction  Status -20 cm H2O (JD Payne aware.)  Chest Tube Air Leak None  Patency Intervention Tip/tilt  Drainage Description Serosanguineous;Dark red (at times dark bloody)  Dressing Status Clean, Dry, Intact (changed. Same dsg replaced. tegaderm)  Dressing Intervention Dressing changed  Site Assessment Clean, Dry, Intact  Surrounding Skin Dry;Intact  Intake (mL) 10 mL  Output (mL) 40 mL

## 2022-11-27 ENCOUNTER — Inpatient Hospital Stay (HOSPITAL_COMMUNITY): Payer: Medicare Other

## 2022-11-27 DIAGNOSIS — Z4682 Encounter for fitting and adjustment of non-vascular catheter: Secondary | ICD-10-CM

## 2022-11-27 DIAGNOSIS — J869 Pyothorax without fistula: Secondary | ICD-10-CM

## 2022-11-27 DIAGNOSIS — R011 Cardiac murmur, unspecified: Secondary | ICD-10-CM | POA: Diagnosis not present

## 2022-11-27 DIAGNOSIS — J90 Pleural effusion, not elsewhere classified: Secondary | ICD-10-CM | POA: Diagnosis not present

## 2022-11-27 LAB — BASIC METABOLIC PANEL
Anion gap: 11 (ref 5–15)
BUN: 29 mg/dL — ABNORMAL HIGH (ref 8–23)
CO2: 27 mmol/L (ref 22–32)
Calcium: 8.2 mg/dL — ABNORMAL LOW (ref 8.9–10.3)
Chloride: 103 mmol/L (ref 98–111)
Creatinine, Ser: 1.63 mg/dL — ABNORMAL HIGH (ref 0.61–1.24)
GFR, Estimated: 47 mL/min — ABNORMAL LOW (ref >60)
Glucose, Bld: 78 mg/dL (ref 70–99)
Potassium: 4.6 mmol/L (ref 3.5–5.1)
Sodium: 141 mmol/L (ref 135–145)

## 2022-11-27 LAB — CBC
HCT: 26.2 % — ABNORMAL LOW (ref 39.0–52.0)
Hemoglobin: 8.3 g/dL — ABNORMAL LOW (ref 13.0–17.0)
MCH: 25.6 pg — ABNORMAL LOW (ref 26.0–34.0)
MCHC: 31.7 g/dL (ref 30.0–36.0)
MCV: 80.9 fL (ref 80.0–100.0)
Platelets: 462 10*3/uL — ABNORMAL HIGH (ref 150–400)
RBC: 3.24 MIL/uL — ABNORMAL LOW (ref 4.22–5.81)
RDW: 14.2 % (ref 11.5–15.5)
WBC: 16.6 10*3/uL — ABNORMAL HIGH (ref 4.0–10.5)
nRBC: 0 % (ref 0.0–0.2)

## 2022-11-27 LAB — TRIGLYCERIDES, BODY FLUIDS

## 2022-11-27 LAB — ECHOCARDIOGRAM COMPLETE
AR max vel: 3.7 cm2
AV Area VTI: 3.41 cm2
AV Area mean vel: 3.6 cm2
AV Mean grad: 4.5 mm[Hg]
AV Peak grad: 8.3 mm[Hg]
Ao pk vel: 1.44 m/s
Area-P 1/2: 4.06 cm2
Height: 73 in
S' Lateral: 2.6 cm
Weight: 3051.17 [oz_av]

## 2022-11-27 LAB — CULTURE, BODY FLUID W GRAM STAIN -BOTTLE: Culture: NO GROWTH

## 2022-11-27 MED ORDER — SODIUM CHLORIDE (PF) 0.9 % IJ SOLN
10.0000 mg | Freq: Once | INTRAMUSCULAR | Status: AC
  Administered 2022-11-27: 10 mg via INTRAPLEURAL
  Filled 2022-11-27: qty 10

## 2022-11-27 MED ORDER — STERILE WATER FOR INJECTION IJ SOLN
5.0000 mg | Freq: Once | RESPIRATORY_TRACT | Status: AC
  Administered 2022-11-27: 5 mg via INTRAPLEURAL
  Filled 2022-11-27 (×2): qty 5

## 2022-11-27 MED ORDER — POLYETHYLENE GLYCOL 3350 17 G PO PACK
17.0000 g | PACK | Freq: Two times a day (BID) | ORAL | Status: DC
  Administered 2022-11-27: 17 g via ORAL
  Filled 2022-11-27 (×9): qty 1

## 2022-11-27 MED ORDER — SODIUM CHLORIDE 0.9% FLUSH
10.0000 mL | Freq: Three times a day (TID) | INTRAVENOUS | Status: DC
  Administered 2022-11-28 – 2022-11-30 (×6): 10 mL via INTRAPLEURAL

## 2022-11-27 MED ORDER — SENNA 8.6 MG PO TABS
1.0000 | ORAL_TABLET | Freq: Every day | ORAL | Status: DC
  Administered 2022-11-27 – 2022-11-30 (×2): 8.6 mg via ORAL
  Filled 2022-11-27 (×4): qty 1

## 2022-11-27 NOTE — Progress Notes (Signed)
NAME:  Stephen Lambert, MRN:  [id number], DOB:  1961/01/03, LOS: 5 ADMISSION DATE:  11/21/2022, CONSULTATION DATE:  10/5 REFERRING MD:  Linwood Dibbles, CHIEF COMPLAINT:  complicated pleural effusion    History of Present Illness:  62 year old male w/ hx as outlined below. Admitted 10/3 w/ 3 weeks of progressive SOB, abd discomfort, fatigue and wt loss. On eval in ER: wbc 32, CXR w/ near complete opacification of right hemithorax w/ F/U CT imaging noting: large loculated right pleural effusion w/ several areas of loculation, also pleural thickening, RLL ATX/collapse, R>L airspace disease AND small non-occlusive LLL Pulmonary emboli.  Was started on IV antibiotics, IV hydration, therapeutic LMWH. On 10/4 underwent diagnostic thora. Only able to pull hazy/yellow fluid. Pleural diagnostics were sent. PCCM asked to see for loculated pleural effusion on 10/5 Pertinent  Medical History  HTN, chronic pain on methadone, prior 1/2ppd smoker x 51yrs  Significant Hospital Events: Including procedures, antibiotic start and stop dates in addition to other pertinent events   10/3 admitted w/PNA, loculated right effusion and small non-occlusive left PE. Started on ABX and [facility] 10/4 right thoracentesis. removed. LDH 5203, protein 4, glucose < 20, cytology and AFB sent.  10/5 PCCM asked to eval given complicated exudative right effusion   Interim History / Subjective:  Plan to repeat fibrinolytics  Objective   Blood pressure 124/61, pulse 71, temperature 98.4 F (36.9 C), temperature source Oral, resp. rate 16, height 6\' 1"  (1.854 m), weight 86.5 kg, SpO2 100%.        Intake/Output Summary (Last 24 hours) at 11/27/2022 1122 Last data filed at 11/27/2022 4098 Gross per 24 hour  Intake 10 ml  Output 1620 ml  Net -1610 ml   Filed Weights   11/21/22 1200 11/22/22 0847 11/26/22 0443  Weight: 78.5 kg 79.7 kg 86.5 kg   Chest tube output 980cc (atrium around 1030cc this morning)  Examination: Awake  and alert no acute distress Chest tube still has copious amount of drainage Decreased breath sounds on the right Heart sounds are regular Abdomen soft nontender Extremities warm Resolved Hospital Problem list     Assessment & Plan:  R empyema adjacent to consolidated RLL- concern for aspiration pneumonia and parapneumonic effusion. Lung cancer is always a concern for family history and previous tobacco abuse. Very low suspicion for TB.  -2nd dose of TPA & dornase 10/7; so far no bleeding complications  CT scan has been repeated We will opt for further fibrinolytics on 11/27/2022 Check chest x-ray in a.m. Monitor chest tube output   Subsegmental PE unprovoked Current level of macular weight heparin Will need 3 months of anticoagulation before reevaluation He will need cancer evaluation with his history   History of tobacco abuse Recommend cessation  Anemia Recent Labs    11/26/22 0517 11/27/22 0546  HGB 9.6* 8.3*   Transfuse per protocol Monitor for bleeding     Best Practice (right click and "Reselect all SmartList Selections" daily)  Per primary   Labs   CBC: Recent Labs  Lab 11/21/22 0750 11/21/22 0812 11/22/22 0401 11/23/22 0538 11/24/22 0537 11/25/22 0646 11/26/22 0517 11/27/22 0546  WBC 32.0*  --  27.8* 26.3* 27.8* 25.4* 21.9* 16.6*  NEUTROABS 26.4*  --  23.4*  --   --   --   --   --   HGB 10.1*   < > 9.4* 9.4* 9.6* 9.4* 9.6* 8.3*  HCT 31.7*   < > 28.6* 29.6* 30.3* 30.1* 30.0* 26.2*  MCV 80.7  --  81.3 80.4 80.6 82.0 82.4 80.9  PLT 333  --  322 337 424* 433* 453* 462*   < > = values in this interval not displayed.    Basic Metabolic Panel: Recent Labs  Lab 11/23/22 0538 11/24/22 0537 11/25/22 0646 11/26/22 0517 11/27/22 0546  NA 139 137 136 137 141  K 4.0 4.8 4.4 4.1 4.6  CL 99 99 99 99 103  CO2 27 29 24 28 27   GLUCOSE 81 89 94 82 78  BUN 30* 27* 32* 36* 29*  CREATININE 1.43* 1.43* 1.51* 1.47* 1.63*  CALCIUM 8.4* 8.5* 8.4* 8.2* 8.2*    GFR: Estimated Creatinine Clearance: 53.1 mL/min (A) (by C-G formula based on SCr of 1.63 mg/dL (H)). Recent Labs  Lab 11/21/22 0952 11/21/22 1148 11/22/22 0401 11/24/22 0537 11/25/22 0646 11/26/22 0517 11/27/22 0546  WBC  --   --    < > 27.8* 25.4* 21.9* 16.6*  LATICACIDVEN 1.0 1.0  --   --   --   --   --    < > = values in this interval not displayed.     Brett Canales Avalina Benko ACNP Acute Care Nurse Practitioner [facility] Pulmonary/Critical Care Please consult Amion 11/27/2022, 11:22 AM

## 2022-11-27 NOTE — Progress Notes (Signed)
Patient has not had any bowel movement since 11/19/2022, per patient he doesn't want to have anything to help him have bowel movement, patient states, he feels he will have it any time now, offered prune juice and he kindly refused.

## 2022-11-27 NOTE — Progress Notes (Addendum)
Daily Progress Note Intern Pager: [phone]  Patient name: Stephen Lambert Medical record number: [id number] Date of birth: 1961-01-16 Age: 62 y.o. Gender: male  Primary Care Provider: Alfredo Martinez, MD Consultants: CCM/Pulmonology Code Status: DNR with interventions  Pt Overview and Major Events to Date:  10/4: Admitted to FMTS 10/6: Chest tube placed  Assessment and Plan:  Stephen Lambert is a 62 yo M with PMHx of OUD (in remission, on methadone), and hypertension who presents here with pleural effusion and small pulmonary embolism in LLL.  Assessment & Plan Pleural effusion 280 cc of serosanguinous output in last 62 hours, patient with improved symptoms since placement 10/6. Pleural fibrinolytic administered 10/6 and 10/7. Pleural cultures with strep intermedius, susceptibilities pending. Cytology with no malignant cells. Acid fast smear negative, culture pending. Blood cultures with no growth at > 4 days. CT scan with near complete resolution of posterior R pocket of pleural fluid, but residual multilocular fluid collections along major fissure.  - CCM consulted, appreciate recommendations - Pleural lytics this AM per CCM  - Continue Abx as below - Follow up pleural culture susceptibilities  - Follow up blood cultures - Follow up TB cultures - Toradol 15 mg q8h PRN - Tylenol 650 mg q6h scheduled - Supplemental O2 as needed  Pulmonary embolism (HCC) Continue tx with Lovenox 80 mg BID. - CCM Consulted, appreciate recommendations:  - Continue Lovenox, as patient subtherapeutic on heparin  - DOAC on discharge   - Age appropriate cancer screening on discharge Leukocytosis No concern at this time for other infectious process other than pulmonary; however uptrend gives concern for possible loculation (will assess with chest CT) - CCM consulted, appreciate recommendations:  - Continue IV Unasyn  - Continue PO Azithromycin (10/4 - 10/7) - Follow up blood cultures - Follow  up fluid cultures  Anemia Hgb downtrending this AM to 8.3 (9.6 yesterday). Suspect this is due to chest tube output, overall 250 cc of serosanguinous output in last 62 hours. Patient with no other signs/symptoms consistent with active bleed. Iron studies/TIBC and ferritin consistent with anemia of chronic disease/IDA picture.  - Continue PO ferrous sulfate 325 mg daily - CBC in AM to monitor Hgb AKI (acute kidney injury) (HCC) Baseline Cr 1.4-1.5. Cr this AM of 1.6. \ - BMP in AM to trend renal function  Systolic murmur 2/6 systolic ejection murmur noted on exam. - Follow up Echo Thyroid mass - FNA biopsy outpatient Hepatomegaly Hepatomegaly improved on exam. Abdominal exam benign this AM. Hepatitis panel and HIV Ab negative. No other symptoms. RUQ [place] with 7 mm hypoechoic mass in the left hepatic lobe. LFTs WNL (21/18).  - Continue to monitor for clinical signs/symptoms   Chronic and Stable Issues: HTN: Holding home Amlodipine, lisinopril/hydrochlorothiazide 20-25 daily in setting of soft BP and AKI during admission   FEN/GI: Regular Diet PPx: Lovenox Dispo: Home pending clinical improvement   Subjective:  Patient feels much improved this morning. Had BM this morning, not bloody, normal. No abdominal pain, n/v. Improved pleuritic pain, no CP, shortness of breath, HA, leg pain.   Objective: Temp:  [97.6 F (36.4 C)-98.6 F (37 C)] 98.4 F (36.9 C) (10/09 0742) Pulse Rate:  [56-71] 71 (10/09 0742) Resp:  [14-18] 16 (10/09 0742) BP: (107-124)/(57-85) 124/61 (10/09 0742) SpO2:  [94 %-100 %] 100 % (10/09 0742) Physical Exam: General: Chronically ill-appearing, temporal wasting. Alert and oriented, NAD.  Cardiovascular: 2/6 systolic ejection flow murmur noted, rate regular. No murmurs, rubs, gallops.  Respiratory: Diminished breath sounds in R lung fields, but improved air movement since chest tube placement. No focal wheezing. Intact breath sounds with good air movement on L side.   Abdomen: No abdominal tenderness, rebound, guarding, or distension.  Extremities: Warm and well-perfused. Trace bilateral edema that is non-pitting, Intact pulses.   Laboratory: Most recent CBC Lab Results  Component Value Date   WBC 16.6 (H) 11/27/2022   HGB 8.3 (L) 11/27/2022   HCT 26.2 (L) 11/27/2022   MCV 80.9 11/27/2022   PLT 462 (H) 11/27/2022   Most recent BMP    Latest Ref Rng & Units 11/27/2022    5:46 AM  BMP  Glucose 70 - 99 mg/dL 78   BUN 8 - 23 mg/dL 29   Creatinine 9.56 - 1.24 mg/dL 2.13   Sodium 086 - 578 mmol/L 141   Potassium 3.5 - 5.1 mmol/L 4.6   Chloride 98 - 111 mmol/L 103   CO2 22 - 32 mmol/L 27   Calcium 8.9 - 10.3 mg/dL 8.2     Other pertinent labs: Blood cultures with no growth at > 5d Troponin: 58 > 25 Lactic Acid 1.0 UA negative UDS negative COVId negative TSH 0.915 BNP 165 HIV negative, Hepatitis panel non reactive  Pleural Fluid Cultures = abundant gram positive cocci   Imaging/Diagnostic Tests: CXR 10/6: 1. Interval placement of small bore right-sided chest tube with decreased size of right-sided pleural effusion. Persistent atelectasis and/or consolidation throughout the right mid to lower lung.   CT chest with contrast (10/5):   IMPRESSION: 1. Moderate-to-large graft the multiloculated right pleural effusion with surrounding pleural thickening, little changed following 100 mL thoracentesis. 2. There is new demonstration of several small air bubbles in the dominant posterior component of the fluid. I suspect these were probably introduced at the time of thoracentesis but it is also possible the air in the fluid could be on an infectious basis or due to a bronchopleural fistula. 3. Likely little if any change in the scattered segmental and subsegmental left lower lobe and lingular arterial emboli seen on the recent CTA, but this is not re-evaluated on this nondedicated exam. 4. Stable airspace/interstitial disease in the  posterior segment of the right upper lobe abutting the fluid. Interval clearing of prior ground-glass opacities on the left. 5. Slightly prominent precarinal and subcarinal lymph nodes, unchanged. 6. Hepatic steatosis. 7. Aortic atherosclerosis. 8. Aberrant retroesophageal right subclavian artery. 9. 4.3 cm simple cyst superior pole right kidney.   CT A/P:  Moderate-to-severe early enlarged liver measuring up to 22.4 cm in length. Non-cirrhotic configuration. No suspicious mass. There is a 5 mm hypoattenuating focus in the left hepatic dome, which is too small to adequately characterize no intrahepatic or extrahepatic bile duct dilation.   RUQ [place]: 1. Small amount of sludge within the gallbladder lumen. No gallbladder wall thickening or pericholecystic fluid. 2. There is a 7 mm hypoechoic mass in the left hepatic lobe, too small to characterize. Recommend attention on follow-up.   Thyroid US: R lobe nodule 2 cm   Susy Manor, Medical Student 11/27/2022, 7:44 AM  PGY-, [facility] FPTS Intern pager: [phone], text pages welcome Secure chat group [facility] Teaching Service   I agree with the assessment and plan by the medical student.  Darral Dash, DO PGY-3 [facility]

## 2022-11-27 NOTE — Progress Notes (Signed)
Pleur-e-vac changed, hooked to suction as ordered.

## 2022-11-27 NOTE — Assessment & Plan Note (Addendum)
-   FNA biopsy outpatient

## 2022-11-27 NOTE — Assessment & Plan Note (Addendum)
2/6 systolic ejection murmur noted on exam. - Follow up Echo

## 2022-11-27 NOTE — Assessment & Plan Note (Addendum)
280 cc of serosanguinous output in last 24 hours, patient with improved symptoms since placement 10/6. Pleural fibrinolytic administered 10/6 and 10/7. Pleural cultures with strep intermedius, susceptibilities pending. Cytology with no malignant cells. Acid fast smear negative, culture pending. Blood cultures with no growth at > 4 days. CT scan with near complete resolution of posterior R pocket of pleural fluid, but residual multilocular fluid collections along major fissure.  - CCM consulted, appreciate recommendations - Pleural lytics this AM per CCM  - Continue Abx as below - Follow up pleural culture susceptibilities  - Follow up blood cultures - Follow up TB cultures - Toradol 15 mg q8h PRN - Tylenol 650 mg q6h scheduled - Supplemental O2 as needed

## 2022-11-27 NOTE — Procedures (Signed)
Pleural Fibrinolytic Administration Procedure Note  Stephen Lambert  [id number]  04/03/1960  Date:11/27/22  Time:4:27 PM   Provider Performing:Noura Purpura E  Cherlynn Polo   Procedure: Pleural Fibrinolysis Subsequent day (57846)  Indication(s) Fibrinolysis of complicated pleural effusion  Consent Risks of the procedure as well as the alternatives and risks of each were explained to the patient and/or caregiver.  Consent for the procedure was obtained.   Anesthesia None   Time Out Verified patient identification, verified procedure, site/side was marked, verified correct patient position, special equipment/implants available, medications/allergies/relevant history reviewed, required imaging and test results available.   Sterile Technique Hand hygiene, gloves   Procedure Description Existing pleural catheter was cleaned and accessed in sterile manner.  10mg  of tPA in 30cc of saline and 5mg  of dornase in 30cc of sterile water were injected into pleural space using existing pleural catheter.  Catheter will be clamped for 1 hour and then placed back to suction.   Complications/Tolerance None; patient tolerated the procedure well.  EBL None   Specimen(s) None   Under supervision of Cloyd Stagers, PA-C  [facility] 11/27/2022, 4:28 PM  Please see [email or web address] for pager details.  From 7A-7P if no response, please call [phone]. After hours, please call ELink [phone].

## 2022-11-27 NOTE — Assessment & Plan Note (Addendum)
Continue tx with Lovenox 80 mg BID. - CCM Consulted, appreciate recommendations:  - Continue Lovenox, as patient subtherapeutic on heparin  - DOAC on discharge   - Age appropriate cancer screening on discharge

## 2022-11-27 NOTE — Plan of Care (Signed)

## 2022-11-27 NOTE — Plan of Care (Signed)
  Problem: Elimination: Goal: Will not experience complications related to bowel motility Outcome: Not Progressing   Problem: Pain Managment: Goal: General experience of comfort will improve Outcome: Not Progressing   Problem: Clinical Measurements: Goal: Respiratory complications will improve Outcome: Not Progressing

## 2022-11-27 NOTE — Assessment & Plan Note (Addendum)
Hgb downtrending this AM to 8.3 (9.6 yesterday). Suspect this is due to chest tube output, overall 250 cc of serosanguinous output in last 24 hours. Patient with no other signs/symptoms consistent with active bleed. Iron studies/TIBC and ferritin consistent with anemia of chronic disease/IDA picture.  - Continue PO ferrous sulfate 325 mg daily - CBC in AM to monitor Hgb

## 2022-11-27 NOTE — Assessment & Plan Note (Addendum)
Baseline Cr 1.4-1.5. Cr this AM of 1.6. \ - BMP in AM to trend renal function

## 2022-11-27 NOTE — Assessment & Plan Note (Addendum)
Hepatomegaly improved on exam. Abdominal exam benign this AM. Hepatitis panel and HIV Ab negative. No other symptoms. RUQ [place] with 7 mm hypoechoic mass in the left hepatic lobe. LFTs WNL (21/18).  - Continue to monitor for clinical signs/symptoms

## 2022-11-27 NOTE — Assessment & Plan Note (Deleted)
Well-controlled, on home methadone dose of 80 mg daily. Patient does report constipation during admission, suspect this is in the setting of ongoing methadone treatment. Had good BM this morning after 2 doses of Miralax.  - Continue MiraLAX daily

## 2022-11-27 NOTE — Progress Notes (Signed)
*  PRELIMINARY RESULTS* Echocardiogram 2D Echocardiogram has been performed.  Stephen Lambert 11/27/2022, 1:41 PM

## 2022-11-27 NOTE — Assessment & Plan Note (Addendum)
No concern at this time for other infectious process other than pulmonary; however uptrend gives concern for possible loculation (will assess with chest CT) - CCM consulted, appreciate recommendations:  - Continue IV Unasyn  - Continue PO Azithromycin (10/4 - 10/7) - Follow up blood cultures - Follow up fluid cultures

## 2022-11-27 NOTE — Plan of Care (Signed)
  Problem: Education: Goal: Knowledge of General Education information will improve Description: Including pain rating scale, medication(s)/side effects and non-pharmacologic comfort measures Outcome: Progressing   Problem: Health Behavior/Discharge Planning: Goal: Ability to manage health-related needs will improve Outcome: Progressing   Problem: Activity: Goal: Risk for activity intolerance will decrease Outcome: Progressing   Problem: Nutrition: Goal: Adequate nutrition will be maintained Outcome: Progressing   Problem: Elimination: Goal: Will not experience complications related to bowel motility Outcome: Progressing Goal: Will not experience complications related to urinary retention Outcome: Progressing   Problem: Pain Managment: Goal: General experience of comfort will improve Outcome: Progressing   Problem: Safety: Goal: Ability to remain free from injury will improve Outcome: Progressing   Problem: Skin Integrity: Goal: Risk for impaired skin integrity will decrease Outcome: Progressing   

## 2022-11-28 ENCOUNTER — Inpatient Hospital Stay (HOSPITAL_COMMUNITY): Payer: Medicare Other

## 2022-11-28 DIAGNOSIS — Z4682 Encounter for fitting and adjustment of non-vascular catheter: Secondary | ICD-10-CM | POA: Diagnosis not present

## 2022-11-28 DIAGNOSIS — J869 Pyothorax without fistula: Secondary | ICD-10-CM | POA: Diagnosis not present

## 2022-11-28 DIAGNOSIS — J90 Pleural effusion, not elsewhere classified: Secondary | ICD-10-CM | POA: Diagnosis not present

## 2022-11-28 DIAGNOSIS — J189 Pneumonia, unspecified organism: Secondary | ICD-10-CM

## 2022-11-28 LAB — BASIC METABOLIC PANEL
Anion gap: 11 (ref 5–15)
BUN: 27 mg/dL — ABNORMAL HIGH (ref 8–23)
CO2: 27 mmol/L (ref 22–32)
Calcium: 8.4 mg/dL — ABNORMAL LOW (ref 8.9–10.3)
Chloride: 101 mmol/L (ref 98–111)
Creatinine, Ser: 1.36 mg/dL — ABNORMAL HIGH (ref 0.61–1.24)
GFR, Estimated: 59 mL/min — ABNORMAL LOW (ref >60)
Glucose, Bld: 78 mg/dL (ref 70–99)
Potassium: 4.8 mmol/L (ref 3.5–5.1)
Sodium: 139 mmol/L (ref 135–145)

## 2022-11-28 LAB — CBC
HCT: 28.1 % — ABNORMAL LOW (ref 39.0–52.0)
Hemoglobin: 8.8 g/dL — ABNORMAL LOW (ref 13.0–17.0)
MCH: 26.1 pg (ref 26.0–34.0)
MCHC: 31.3 g/dL (ref 30.0–36.0)
MCV: 83.4 fL (ref 80.0–100.0)
Platelets: 589 10*3/uL — ABNORMAL HIGH (ref 150–400)
RBC: 3.37 MIL/uL — ABNORMAL LOW (ref 4.22–5.81)
RDW: 14.3 % (ref 11.5–15.5)
WBC: 17.6 10*3/uL — ABNORMAL HIGH (ref 4.0–10.5)
nRBC: 0 % (ref 0.0–0.2)

## 2022-11-28 MED ORDER — APIXABAN 5 MG PO TABS
5.0000 mg | ORAL_TABLET | Freq: Two times a day (BID) | ORAL | Status: DC
  Administered 2022-11-28 – 2022-12-02 (×8): 5 mg via ORAL
  Filled 2022-11-28 (×8): qty 1

## 2022-11-28 NOTE — Plan of Care (Signed)

## 2022-11-28 NOTE — Assessment & Plan Note (Addendum)
Patient with 2/6 systolic flow murmur noted on exam. Echo 10/9 with LVEF of 60-65% and no regional wall abnormalities. Patient asymptomatic.  - Continue to monitor vitals and symptoms

## 2022-11-28 NOTE — Assessment & Plan Note (Addendum)
Hgb stable this morning at 8.8. Baseline of 9-10, suspect this is due to chest tube serosanguinous output. Patient with no other signs/symptoms consistent with active bleed. Iron studies/TIBC and ferritin consistent with anemia of chronic disease/IDA picture.  - Continue PO ferrous sulfate 325 mg daily - CBC in AM to monitor Hgb

## 2022-11-28 NOTE — Assessment & Plan Note (Addendum)
Thyroid US with 2 cm nodule, recommended FNA biopsy. TSH WNL at 0.915. Patient with no tachycardia, weight changes.  - FNA biopsy outpatient

## 2022-11-28 NOTE — Assessment & Plan Note (Addendum)
Small pulmonary embolism in LLL, no evidence of R heart strain on CT. Appears unprovoked, however, potential underlying malignancy could cause underlying hypercoagulability. Patient hemodynamically stable this morning.  - CCM Consulted, appreciate recommendations:  - Continue Lovenox, as patient subtherapeutic on heparin  - DOAC on discharge   - Age appropriate cancer screening on discharge

## 2022-11-28 NOTE — Progress Notes (Addendum)
Daily Progress Note Intern Pager: [phone]  Patient name: Stephen Lambert Medical record number: [id number] Date of birth: [birth year] Age: 62 y.o. Gender: male  Primary Care Provider: Alfredo Martinez, MD Consultants: CCM/Pulmonology Code Status: DNR with Interventions  Pt Overview and Major Events to Date:  10/4: Admitted to FMTS 10/6: Chest tube placed  Assessment and Plan:  Stephen Lambert is a 62 yo M with PMHx of OUD (in remission, on methadone), and hypertension who presents here with pleural effusion and small pulmonary embolism in LLL.  Assessment & Plan Empyema  Pleural Effusion 350 cc of serosanguinous output, patient with improved symptoms since placement 10/6. Pleural lytics administered 10/6, 10/7, and 10/9. Pleural cultures with strep intermedius, susceptibilities pending. Cytology with no malignant cells. Acid fast smear negative, culture pending. Blood cultures with no growth at > 4 days. CT scan with near complete resolution of posterior R pocket of pleural fluid, but residual multilocular fluid collections along major fissure. WBC 17.6 this morning, 16.6 yesterday. Low concern for worsening infection or new infectious process other than acute PNA, given no other new infectious symptoms and patient afebrile.  - CCM consulted, appreciate recommendations - Possible discontinuation of chest tube today - Recommended prolonged course of outpatient antibiotics given residual fluid pockets for 4-6 weeks.   - Continue IV Unasyn during inpatient stay  - Outpatient dental follow up - Follow up pleural culture susceptibilities  - Follow up blood cultures - Follow up TB cultures - Toradol 15 mg q8h PRN - Tylenol 650 mg q6h scheduled - Supplemental O2 as needed  Pulmonary embolism (HCC) Small pulmonary embolism in LLL, no evidence of R heart strain on CT. Appears unprovoked, however, potential underlying malignancy could cause underlying hypercoagulability. Patient  hemodynamically stable this morning.  - CCM Consulted, appreciate recommendations:  - Continue Lovenox, as patient subtherapeutic on heparin  - DOAC on discharge   - Age appropriate cancer screening on discharge  Anemia Hgb stable this morning at 8.8. Baseline of 9-10, suspect this is due to chest tube serosanguinous output. Patient with no other signs/symptoms consistent with active bleed. Iron studies/TIBC and ferritin consistent with anemia of chronic disease/IDA picture.  - Continue PO ferrous sulfate 325 mg daily - CBC in AM to monitor Hgb AKI (acute kidney injury) (HCC) Baseline Cr 1.4-1.5. Elevated to 1.8 during admission, but improved at this point. Cr this AM of 1.36.  - BMP in AM to trend renal function  Systolic murmur Patient with 2/6 systolic flow murmur noted on exam. Echo 10/9 with LVEF of 60-65% and no regional wall abnormalities. Patient asymptomatic.  - Continue to monitor vitals and symptoms Thyroid mass Thyroid US with 2 cm nodule, recommended FNA biopsy. TSH WNL at 0.915. Patient with no tachycardia, weight changes.  - FNA biopsy outpatient Hepatomegaly Hepatomegaly improved on exam. Abdominal exam benign this AM. Hepatitis panel and HIV Ab negative. No other symptoms. RUQ [place] with 7 mm hypoechoic mass in the left hepatic lobe. Spoke with Radiology, they feel it is likely a benign cyst, recommend follow up ultrasound in 3 months. LFTs WNL (21/18).  - Continue to monitor for clinical signs/symptoms   Chronic and Stable Issues: HTN: Holding home Amlodipine, lisinopril/hydrochlorothiazide 20-25 daily in setting of soft BP and AKI during admission   FEN/GI: Regular Diet PPx: Lovenox Dispo: Home pending clinical improvement   Subjective:  Patient feels improved this morning.   Objective: Temp:  [98 F (36.7 C)-98.5 F (36.9 C)] 98.5 F (36.9  C) (10/10 0504) Pulse Rate:  [58-67] 58 (10/10 0504) Resp:  [16-22] 18 (10/10 0504) BP: (105-127)/(58-75) 105/58 (10/10  0504) SpO2:  [97 %-100 %] 98 % (10/10 0504) Physical Exam: General: Chronically ill-appearing, temporal wasting. Alert and oriented, NAD.  Cardiovascular: 2/6 systolic ejection flow murmur noted, rate regular. No murmurs, rubs, gallops.  Respiratory: Diminished breath sounds in R lung fields, but improved air movement since yesterday. No focal wheezing. Intact breath sounds with good air movement on L side.  Abdomen: No abdominal tenderness, rebound, guarding, or distension.  Extremities: Warm and well-perfused. Trace bilateral edema that is non-pitting, intact pulses.   Laboratory: Most recent CBC Lab Results  Component Value Date   WBC 16.6 (H) 11/27/2022   HGB 8.3 (L) 11/27/2022   HCT 26.2 (L) 11/27/2022   MCV 80.9 11/27/2022   PLT 462 (H) 11/27/2022   Most recent BMP    Latest Ref Rng & Units 11/27/2022    5:46 AM  BMP  Glucose 70 - 99 mg/dL 78   BUN 8 - 23 mg/dL 29   Creatinine 1.30 - 1.24 mg/dL 8.65   Sodium 784 - 696 mmol/L 141   Potassium 3.5 - 5.1 mmol/L 4.6   Chloride 98 - 111 mmol/L 103   CO2 22 - 32 mmol/L 27   Calcium 8.9 - 10.3 mg/dL 8.2     Other pertinent labs: Blood cultures with no growth at > 5d Troponin: 58 > 25 Lactic Acid 1.0 UA negative UDS negative COVId negative TSH 0.915 BNP 165 HIV negative, Hepatitis panel non reactive  Pleural Fluid Cultures = abundant gram positive cocci   Imaging/Diagnostic Tests: CXR 10/6: 1. Interval placement of small bore right-sided chest tube with decreased size of right-sided pleural effusion. Persistent atelectasis and/or consolidation throughout the right mid to lower lung.   CT chest (10/8):  IMPRESSION: 1. Indwelling right-sided pigtail drainage catheter, with near complete resolution of the posterior complex right pleural fluid collection seen previously. 2. Residual multilocular fluid collections along the major fissure and within the right anterior costophrenic angle, which likely do not communicate  with indwelling pleural drainage catheter. These collections are not significantly changed since the prior study. 3. Multifocal airspace disease throughout the right lung, greatest in the right middle and right lower lobes, compatible with underlying pneumonia.   CT A/P:  Moderate-to-severe early enlarged liver measuring up to 22.4 cm in length. Non-cirrhotic configuration. No suspicious mass. There is a 5 mm hypoattenuating focus in the left hepatic dome, which is too small to adequately characterize no intrahepatic or extrahepatic bile duct dilation.   RUQ [place]: 1. Small amount of sludge within the gallbladder lumen. No gallbladder wall thickening or pericholecystic fluid. 2. There is a 7 mm hypoechoic mass in the left hepatic lobe, too small to characterize. Recommend attention on follow-up.   Thyroid US: R lobe nodule 2 cm   Susy Manor, Medical Student 11/28/2022, 7:44 AM   I was personally present and performed or re-performed the history, physical exam and medical decision making activities of this service and have verified that the service and findings are accurately documented in the student's note.  Janeal Holmes, MD                  11/28/2022, 2:07 PM PGY-2, [facility] FPTS Intern pager: [phone], text pages welcome Secure chat group [facility] Teaching Service

## 2022-11-28 NOTE — Progress Notes (Signed)
NAME:  Stephen Lambert, MRN:  [id number], DOB:  08/30/1960, LOS: 6 ADMISSION DATE:  11/21/2022, CONSULTATION DATE:  10/5 REFERRING MD:  Linwood Dibbles, CHIEF COMPLAINT:  complicated pleural effusion    History of Present Illness:  61 year old male w/ hx as outlined below. Admitted 10/3 w/ 3 weeks of progressive SOB, abd discomfort, fatigue and wt loss. On eval in ER: wbc 32, CXR w/ near complete opacification of right hemithorax w/ F/U CT imaging noting: large loculated right pleural effusion w/ several areas of loculation, also pleural thickening, RLL ATX/collapse, R>L airspace disease AND small non-occlusive LLL Pulmonary emboli.  Was started on IV antibiotics, IV hydration, therapeutic LMWH. On 10/4 underwent diagnostic thora. Only able to pull hazy/yellow fluid. Pleural diagnostics were sent. PCCM asked to see for loculated pleural effusion on 10/5 Pertinent  Medical History  HTN, chronic pain on methadone, prior 1/2ppd smoker x 41yrs  Significant Hospital Events: Including procedures, antibiotic start and stop dates in addition to other pertinent events   10/3 admitted w/PNA, loculated right effusion and small non-occlusive left PE. Started on ABX and [facility] 10/4 right thoracentesis. removed. LDH 5203, protein 4, glucose < 20, cytology and AFB sent.  10/5 PCCM asked to eval given complicated exudative right effusion   Interim History / Subjective:  Plan to repeat fibrinolytics  Objective   Blood pressure 105/63, pulse 66, temperature 98.2 F (36.8 C), temperature source Oral, resp. rate 16, height 6\' 1"  (1.854 m), weight 86.5 kg, SpO2 99%.        Intake/Output Summary (Last 24 hours) at 11/28/2022 1018 Last data filed at 11/28/2022 0500 Gross per 24 hour  Intake 160 ml  Output 1150 ml  Net -990 ml   Filed Weights   11/21/22 1200 11/22/22 0847 11/26/22 0443  Weight: 78.5 kg 79.7 kg 86.5 kg   Chest tube output 980cc (atrium around 1030cc this morning)  Examination: Awake  alert no acute distress Right chest tube with additional 1000 cc of red drainage Chest clear to auscultation Heart sounds are regular Extremities without edema Resolved Hospital Problem list     Assessment & Plan:  R empyema adjacent to consolidated RLL- concern for aspiration pneumonia and parapneumonic effusion. Lung cancer is always a concern for family history and previous tobacco abuse. Very low suspicion for TB.  -2nd dose of TPA & dornase 10/7; so far no bleeding complications  Fibrinolytics were repeated on 11/27/2022 with an additional 980 cc of drainage obtained. Consider DC and chest tube   Subsegmental PE unprovoked Continue anticoagulation   History of tobacco abuse Recommend cessation  Anemia Recent Labs    11/27/22 0546 11/28/22 0816  HGB 8.3* 8.8*   Transfuse per protocol Monitor for bleeding     Best Practice (right click and "Reselect all SmartList Selections" daily)  Per primary   Labs   CBC: Recent Labs  Lab 11/22/22 0401 11/23/22 0538 11/24/22 0537 11/25/22 0646 11/26/22 0517 11/27/22 0546 11/28/22 0816  WBC 27.8*   < > 27.8* 25.4* 21.9* 16.6* 17.6*  NEUTROABS 23.4*  --   --   --   --   --   --   HGB 9.4*   < > 9.6* 9.4* 9.6* 8.3* 8.8*  HCT 28.6*   < > 30.3* 30.1* 30.0* 26.2* 28.1*  MCV 81.3   < > 80.6 82.0 82.4 80.9 83.4  PLT 322   < > 424* 433* 453* 462* 589*   < > = values in this  interval not displayed.    Basic Metabolic Panel: Recent Labs  Lab 11/24/22 0537 11/25/22 0646 11/26/22 0517 11/27/22 0546 11/28/22 0816  NA 137 136 137 141 139  K 4.8 4.4 4.1 4.6 4.8  CL 99 99 99 103 101  CO2 29 24 28 27 27   GLUCOSE 89 94 82 78 78  BUN 27* 32* 36* 29* 27*  CREATININE 1.43* 1.51* 1.47* 1.63* 1.36*  CALCIUM 8.5* 8.4* 8.2* 8.2* 8.4*   GFR: Estimated Creatinine Clearance: 63.6 mL/min (A) (by C-G formula based on SCr of 1.36 mg/dL (H)). Recent Labs  Lab 11/21/22 1148 11/22/22 0401 11/25/22 0646 11/26/22 0517 11/27/22 0546  11/28/22 0816  WBC  --    < > 25.4* 21.9* 16.6* 17.6*  LATICACIDVEN 1.0  --   --   --   --   --    < > = values in this interval not displayed.     Brett Canales Abegail Kloeppel ACNP Acute Care Nurse Practitioner [facility]/Critical [facility] Please consult Amion 11/28/2022, 10:18 AM

## 2022-11-28 NOTE — Assessment & Plan Note (Addendum)
Hepatomegaly improved on exam. Abdominal exam benign this AM. Hepatitis panel and HIV Ab negative. No other symptoms. RUQ [place] with 7 mm hypoechoic mass in the left hepatic lobe. Spoke with Radiology, they feel it is likely a benign cyst, recommend follow up ultrasound in 3 months. LFTs WNL (21/18).  - Continue to monitor for clinical signs/symptoms

## 2022-11-28 NOTE — Assessment & Plan Note (Deleted)
   No concern at this time for other infectious process other than pulmonary; however uptrend gives concern for possible loculation (will assess with chest CT) - CCM consulted, appreciate recommendations:  - Continue IV Unasyn  - Continue PO Azithromycin (10/4 - 10/7) - Follow up blood cultures - Follow up fluid cultures   Wbc 17.6, only up a little, could also be hemoconcentrated because everything came up a little Stayed afebrile and no symptoms

## 2022-11-28 NOTE — Assessment & Plan Note (Addendum)
350 cc of serosanguinous output, patient with improved symptoms since placement 10/6. Pleural lytics administered 10/6, 10/7, and 10/9. Pleural cultures with strep intermedius, susceptibilities pending. Cytology with no malignant cells. Acid fast smear negative, culture pending. Blood cultures with no growth at > 4 days. CT scan with near complete resolution of posterior R pocket of pleural fluid, but residual multilocular fluid collections along major fissure. WBC 17.6 this morning, 16.6 yesterday. Low concern for worsening infection or new infectious process other than acute PNA, given no other new infectious symptoms and patient afebrile.  - CCM consulted, appreciate recommendations - Possible discontinuation of chest tube today - Recommended prolonged course of outpatient antibiotics given residual fluid pockets for 4-6 weeks.   - Continue IV Unasyn during inpatient stay  - Outpatient dental follow up - Follow up pleural culture susceptibilities  - Follow up blood cultures - Follow up TB cultures - Toradol 15 mg q8h PRN - Tylenol 650 mg q6h scheduled - Supplemental O2 as needed

## 2022-11-28 NOTE — Assessment & Plan Note (Addendum)
Baseline Cr 1.4-1.5. Elevated to 1.8 during admission, but improved at this point. Cr this AM of 1.36.  - BMP in AM to trend renal function

## 2022-11-29 ENCOUNTER — Inpatient Hospital Stay (HOSPITAL_COMMUNITY): Payer: Medicare Other

## 2022-11-29 DIAGNOSIS — J90 Pleural effusion, not elsewhere classified: Secondary | ICD-10-CM | POA: Diagnosis not present

## 2022-11-29 LAB — BASIC METABOLIC PANEL
Anion gap: 8 (ref 5–15)
BUN: 21 mg/dL (ref 8–23)
CO2: 27 mmol/L (ref 22–32)
Calcium: 7.9 mg/dL — ABNORMAL LOW (ref 8.9–10.3)
Chloride: 103 mmol/L (ref 98–111)
Creatinine, Ser: 1.38 mg/dL — ABNORMAL HIGH (ref 0.61–1.24)
GFR, Estimated: 58 mL/min — ABNORMAL LOW (ref >60)
Glucose, Bld: 76 mg/dL (ref 70–99)
Potassium: 4.9 mmol/L (ref 3.5–5.1)
Sodium: 138 mmol/L (ref 135–145)

## 2022-11-29 LAB — CBC
HCT: 21.6 % — ABNORMAL LOW (ref 39.0–52.0)
HCT: 25.5 % — ABNORMAL LOW (ref 39.0–52.0)
HCT: 24.7 % — ABNORMAL LOW (ref 39.0–52.0)
Hemoglobin: 6.8 g/dL — CL (ref 13.0–17.0)
Hemoglobin: 7.8 g/dL — ABNORMAL LOW (ref 13.0–17.0)
Hemoglobin: 7.7 g/dL — ABNORMAL LOW (ref 13.0–17.0)
MCH: 26.2 pg (ref 26.0–34.0)
MCH: 25 pg — ABNORMAL LOW (ref 26.0–34.0)
MCH: 25.8 pg — ABNORMAL LOW (ref 26.0–34.0)
MCHC: 31.5 g/dL (ref 30.0–36.0)
MCHC: 30.6 g/dL (ref 30.0–36.0)
MCHC: 31.2 g/dL (ref 30.0–36.0)
MCV: 83.1 fL (ref 80.0–100.0)
MCV: 81.7 fL (ref 80.0–100.0)
MCV: 82.6 fL (ref 80.0–100.0)
Platelets: 475 10*3/uL — ABNORMAL HIGH (ref 150–400)
Platelets: 595 10*3/uL — ABNORMAL HIGH (ref 150–400)
Platelets: 590 10*3/uL — ABNORMAL HIGH (ref 150–400)
RBC: 2.6 MIL/uL — ABNORMAL LOW (ref 4.22–5.81)
RBC: 3.12 MIL/uL — ABNORMAL LOW (ref 4.22–5.81)
RBC: 2.99 MIL/uL — ABNORMAL LOW (ref 4.22–5.81)
RDW: 14.2 % (ref 11.5–15.5)
RDW: 14.1 % (ref 11.5–15.5)
RDW: 14.2 % (ref 11.5–15.5)
WBC: 14.5 10*3/uL — ABNORMAL HIGH (ref 4.0–10.5)
WBC: 16.2 10*3/uL — ABNORMAL HIGH (ref 4.0–10.5)
WBC: 18.1 10*3/uL — ABNORMAL HIGH (ref 4.0–10.5)
nRBC: 0 % (ref 0.0–0.2)
nRBC: 0 % (ref 0.0–0.2)
nRBC: 0 % (ref 0.0–0.2)

## 2022-11-29 LAB — BODY FLUID CULTURE W GRAM STAIN

## 2022-11-29 NOTE — Progress Notes (Signed)
Critical hemoglobin of 6.8 was called in from the lab. On call resident provider was notified. An order for a redraw was placed. CN was made aware.

## 2022-11-29 NOTE — TOC Initial Note (Signed)
Transition of Care (TOC) - Initial/Assessment Note   Spoke to patient at bedside. From home with girl friend.   Has cane at home.   PCP Allee Maxwell.   Will continue to follow patient   Plan to discharge patient on PO ABX  Patient Details  Name: Stephen Lambert MRN: [id number] Date of Birth: 11-17-1960  Transition of Care [facility]) CM/SW Contact:    Kingsley Plan, RN Phone Number: 11/29/2022, 1:23 PM  Clinical Narrative:                   Expected Discharge Plan: Home/Self Care Barriers to Discharge: Continued Medical Work up   Patient Goals and CMS Choice Patient states their goals for this hospitalization and ongoing recovery are:: to return to home          Expected Discharge Plan and Services   Discharge Planning Services: CM Consult Post Acute Care Choice: NA Living arrangements for the past 2 months: Single Family Home                 DME Arranged: N/A DME Agency: NA       HH Arranged: NA          Prior Living Arrangements/Services Living arrangements for the past 2 months: Single Family Home Lives with:: Significant Other Patient language and need for interpreter reviewed:: Yes Do you feel safe going back to the place where you live?: Yes      Need for Family Participation in Patient Care: Yes (Comment) Care giver support system in place?: Yes (comment)   Criminal Activity/Legal Involvement Pertinent to Current Situation/Hospitalization: No - Comment as needed  Activities of Daily Living   ADL Screening (condition at time of admission) Independently performs ADLs?: Yes (appropriate for developmental age) Is the patient deaf or have difficulty hearing?: No Does the patient have difficulty seeing, even when wearing glasses/contacts?: No Does the patient have difficulty concentrating, remembering, or making decisions?: No  Permission Sought/Granted   Permission granted to share information with : No              Emotional  Assessment Appearance:: Appears stated age Attitude/Demeanor/Rapport: Engaged Affect (typically observed): Accepting Orientation: : Oriented to Place, Oriented to  Time, Oriented to Self, Oriented to Situation Alcohol / Substance Use: Not Applicable Psych Involvement: No (comment)  Admission diagnosis:  Hepatomegaly [R16.0] Pleural effusion [J90] Thyroid mass [E07.9] 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] AKI (acute kidney injury) (HCC) [N17.9] Leukocytosis, unspecified type [D72.829] Pneumonia of right lung due to infectious organism, unspecified part of lung [J18.9] Single subsegmental pulmonary embolism without acute cor pulmonale (HCC) [I26.93] Acute pulmonary embolism, unspecified pulmonary embolism type, unspecified whether acute cor pulmonale present (HCC) [I26.99] Patient Active Problem List   Diagnosis Date Noted   Pneumonia of right lung due to infectious organism 11/28/2022   Systolic murmur 11/27/2022   Need for management of chest tube 11/27/2022   Empyema (HCC) 11/27/2022   Constipation 11/24/2022   Hypokalemia 11/22/2022   Hepatomegaly 11/22/2022   Anemia 11/22/2022   Pneumonia  Empyema 11/21/2022   Pulmonary embolism (HCC) 11/21/2022   AKI (acute kidney injury) (HCC) 11/21/2022   Thyroid mass 11/21/2022   Leukocytosis 11/21/2022   Elevated serum creatinine 10/15/2022   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   HTN (hypertension) 05/20/2012   Tobacco abuse counseling 05/20/2012   Chronic back pain 05/20/2012   PCP:  Alfredo Martinez, MD Pharmacy:   [facility] [phone] - [place], [place] - [address] [place] [address] Phone: [phone] Fax: [phone]     Social Determinants of Health (SDOH) Social History: SDOH Screenings   Food Insecurity: No Food Insecurity (11/21/2022)  Housing: Low Risk  (11/21/2022)  Transportation Needs: No Transportation Needs (11/21/2022)  Utilities: Not At Risk (11/21/2022)  Alcohol  Screen: Low Risk  (09/30/2022)  Depression (PHQ2-9): Low Risk  (10/15/2022)  Recent Concern: Depression (PHQ2-9) - Medium Risk (09/30/2022)  Financial Resource Strain: Low Risk  (09/30/2022)  Physical Activity: Insufficiently Active (09/30/2022)  Social Connections: Moderately Integrated (09/30/2022)  Stress: No Stress Concern Present (09/30/2022)  Tobacco Use: High Risk (11/21/2022)  Health Literacy: Adequate Health Literacy (09/30/2022)   SDOH Interventions:     Readmission Risk Interventions     No data to display

## 2022-11-29 NOTE — Progress Notes (Signed)
Daily Progress Note Intern Pager: [phone]  Patient name: Stephen Lambert Medical record number: [id number] Date of birth: 01-02-1961 Age: 62 y.o. Gender: male  Primary Care Provider: Alfredo Martinez, MD Consultants: CCM/Pulmonology  Code Status: DNR with Interventions   Pt Overview and Major Events to Date:  10/4: Admitted to FMTS 10/6: Chest tube placed  Assessment and Plan:  Stephen Lambert is a 27 yo M with PMHx of OUD (in remission, on methadone), and hypertension who presents here with pleural effusion and small pulmonary embolism in LLL.  Assessment & Plan Pneumonia  Empyema 200 cc of serosanguinous output, patient with improved symptoms since chest tube placement 10/6. S/P pleural lytics x 3. Pleural cultures with S. Intermedius, pan sensitive. WBC downtrending to 14.5 today. Workup including acid fast smear, blood cultures, pleural cytology negative. CT on 10/9 with resolution of R posterior fluid pocket, but residual multilocular collections. CXR this AM with slightly larger R pleural effusion. Suspect this may be continuing fluid drainage from loculated R major fissure fluid collections noted on 10/9 CT. - CCM consulted, appreciate recommendations - Possible discontinuation of chest tube today - Outpatient Abx course of Augmentin x 4-6 weeks  - Continue IV Unasyn during inpatient stay  - Outpatient dental follow up - Toradol 15 mg q8h PRN - Tylenol 650 mg q6h scheduled - Supplemental O2 as needed  Pulmonary embolism (HCC) Small pulmonary embolism in LLL, no evidence of R heart strain on CT. Appears unprovoked, however, potential underlying malignancy could cause underlying hypercoagulability. Patient with drop in Hgb this AM to 7.8 from baseline between 9-10. Otherwise hemodynamically stable and appears well.  - Continue Eliquis 5 mg BID - Age appropriate cancer screening on discharge  - PM CBC to monitor Hgb given systemic anticoagulation for pulmonary embolism   Anemia Hgb initially at 6.8 this AM, redraw with Hgb of 7.8. Patient with <200 cc of serosanguinous output in last 24 hours, however significant serosanguinous output 10/9 after pleural lytics. No other signs/symptoms consistent with active bleed. Iron studies/TIBC and ferritin consistent with anemia of chronic disease/IDA picture.  - Continue PO ferrous sulfate 325 mg daily - PM CBC to monitor Hgb given systemic anticoagulation for pulmonary embolism  AKI (acute kidney injury) (HCC) Baseline Cr 1.4-1.5. Cr this AM of 1.38. AKI resolved.  - BMP in AM to trend renal function  Systolic murmur Patient with 2/6 systolic flow murmur. Echo 10/9 with LVEF of 60-65% and no regional wall abnormalities. Patient asymptomatic.  - Continue to monitor vitals and symptoms Thyroid mass Thyroid US with 2 cm nodule, recommended FNA biopsy. TSH WNL at 0.915.  - FNA biopsy outpatient Hepatomegaly Hepatomegaly improved on exam. Abdominal exam benign this AM. Hepatitis panel and HIV Ab negative. No other symptoms. RUQ [place] with 7 mm hypoechoic mass in the left hepatic lobe. Spoke with Radiology, they feel it is likely a benign cyst, recommend follow up ultrasound in 3 months. LFTs WNL (21/18).  - Continue to monitor for clinical signs/symptoms    Chronic and Stable Issues: HTN: Holding home Amlodipine, lisinopril/hydrochlorothiazide 20-25 daily in setting of soft BP and AKI during admission   FEN/GI: Regular Diet PPx: Lovenox Dispo: Home pending clinical improvement   Subjective:  Patient feels about the same as yesterday. Reports no pleuritic pain, no chest pain, shortness of breath. Does endorse a dry cough intermittently. No nausea, vomiting, abdominal pain, no blood in stool or hemoptysis.   Objective: Temp:  [98.1 F (36.7 C)-98.4 F (  36.9 C)] 98.4 F (36.9 C) (10/11 0411) Pulse Rate:  [59-66] 59 (10/11 0411) Resp:  [16-18] 18 (10/10 1711) BP: (105-139)/(63-69) 120/68 (10/11 0411) SpO2:  [96 %-99  %] 97 % (10/11 0411) Physical Exam: General: Chronically ill-appearing, temporal wasting. Alert and oriented, NAD.  Cardiovascular: 2/6 systolic ejection flow murmur noted, rate regular. No murmurs, rubs, gallops.  Respiratory: Diminished breath sounds in R lung fields. No focal wheezing. Intact breath sounds with good air movement on L side.  Abdomen: No abdominal tenderness, rebound, guarding, or distension.  Extremities: Warm and well-perfused. Trace bilateral edema that is non-pitting, intact pulses  Laboratory: Most recent CBC Lab Results  Component Value Date   WBC 14.5 (H) 11/29/2022   HGB 6.8 (LL) 11/29/2022   HCT 21.6 (L) 11/29/2022   MCV 83.1 11/29/2022   PLT 475 (H) 11/29/2022   Most recent BMP    Latest Ref Rng & Units 11/29/2022    5:31 AM  BMP  Glucose 70 - 99 mg/dL 76   BUN 8 - 23 mg/dL 21   Creatinine 4.40 - 1.24 mg/dL 1.02   Sodium 725 - 366 mmol/L 138   Potassium 3.5 - 5.1 mmol/L 4.9   Chloride 98 - 111 mmol/L 103   CO2 22 - 32 mmol/L 27   Calcium 8.9 - 10.3 mg/dL 7.9     Other pertinent labs: Blood cultures with no growth at > 5d Troponin: 58 > 25 Lactic Acid 1.0 UA negative UDS negative COVId negative TSH 0.915 BNP 165 HIV negative, Hepatitis panel non reactive  Pleural Fluid Cultures = abundant gram positive cocci   Imaging/Diagnostic Tests: CXR 10/6: 1. Interval placement of small bore right-sided chest tube with decreased size of right-sided pleural effusion. Persistent atelectasis and/or consolidation throughout the right mid to lower lung.   CT chest (10/8):  IMPRESSION: 1. Indwelling right-sided pigtail drainage catheter, with near complete resolution of the posterior complex right pleural fluid collection seen previously. 2. Residual multilocular fluid collections along the major fissure and within the right anterior costophrenic angle, which likely do not communicate with indwelling pleural drainage catheter. These collections are  not significantly changed since the prior study. 3. Multifocal airspace disease throughout the right lung, greatest in the right middle and right lower lobes, compatible with underlying pneumonia.   CT A/P:  Moderate-to-severe early enlarged liver measuring up to 22.4 cm in length. Non-cirrhotic configuration. No suspicious mass. There is a 5 mm hypoattenuating focus in the left hepatic dome, which is too small to adequately characterize no intrahepatic or extrahepatic bile duct dilation.   RUQ [place]: 1. Small amount of sludge within the gallbladder lumen. No gallbladder wall thickening or pericholecystic fluid. 2. There is a 7 mm hypoechoic mass in the left hepatic lobe, too small to characterize. Recommend attention on follow-up.   Thyroid US: R lobe nodule 2 cm  CXR 10/11:  IMPRESSION: Stable position of right-sided chest tube without pneumothorax. Right pleural effusion appears to be slightly enlarged with associated atelectasis or infiltrate.  [place], Medical Student 11/29/2022, 7:27 AM  PGY-, [facility] FPTS Intern pager: [phone], text pages welcome Secure chat group [facility] Teaching Service

## 2022-11-29 NOTE — Plan of Care (Signed)

## 2022-11-29 NOTE — Assessment & Plan Note (Addendum)
200 cc of serosanguinous output, patient with improved symptoms since chest tube placement 10/6. S/P pleural lytics x 3. Pleural cultures with S. Intermedius, pan sensitive. WBC downtrending to 14.5 today. Workup including acid fast smear, blood cultures, pleural cytology negative. CT on 10/9 with resolution of R posterior fluid pocket, but residual multilocular collections. CXR this AM with slightly larger R pleural effusion. Suspect this may be continuing fluid drainage from loculated R major fissure fluid collections noted on 10/9 CT. - CCM consulted, appreciate recommendations - Possible discontinuation of chest tube today - Outpatient Abx course of Augmentin x 4-6 weeks  - Continue IV Unasyn during inpatient stay  - Outpatient dental follow up - Toradol 15 mg q8h PRN - Tylenol 650 mg q6h scheduled - Supplemental O2 as needed

## 2022-11-29 NOTE — Assessment & Plan Note (Addendum)
Small pulmonary embolism in LLL, no evidence of R heart strain on CT. Appears unprovoked, however, potential underlying malignancy could cause underlying hypercoagulability. Patient with drop in Hgb this AM to 7.8 from baseline between 9-10. Otherwise hemodynamically stable and appears well.  - Continue Eliquis 5 mg BID - Age appropriate cancer screening on discharge  - PM CBC to monitor Hgb given systemic anticoagulation for pulmonary embolism

## 2022-11-29 NOTE — Progress Notes (Signed)
   NAME:  Stephen Lambert, MRN:  [id number], DOB:  1960/04/27, LOS: 7 ADMISSION DATE:  11/21/2022, CONSULTATION DATE:  10/5 REFERRING MD:  Linwood Dibbles, CHIEF COMPLAINT:  complicated pleural effusion    History of Present Illness:  62 year old male w/ hx as outlined below. Admitted 10/3 w/ 3 weeks of progressive SOB, abd discomfort, fatigue and wt loss. On eval in ER: wbc 32, CXR w/ near complete opacification of right hemithorax w/ F/U CT imaging noting: large loculated right pleural effusion w/ several areas of loculation, also pleural thickening, RLL ATX/collapse, R>L airspace disease AND small non-occlusive LLL Pulmonary emboli.  Was started on IV antibiotics, IV hydration, therapeutic LMWH. On 10/4 underwent diagnostic thora. Only able to pull hazy/yellow fluid. Pleural diagnostics were sent. PCCM asked to see for loculated pleural effusion on 10/5 Pertinent  Medical History  HTN, chronic pain on methadone, prior 1/2ppd smoker x 31yrs  Significant Hospital Events: Including procedures, antibiotic start and stop dates in addition to other pertinent events   10/3 admitted w/PNA, loculated right effusion and small non-occlusive left PE. Started on ABX and [facility] 10/4 right thoracentesis. removed. LDH 5203, protein 4, glucose < 20, cytology and AFB sent.  10/5 PCCM asked to eval given complicated exudative right effusion  10/11-still with over 200 cc  Interim History / Subjective:  No pain or discomfort Appears comfortable  Objective   Blood pressure 109/65, pulse 64, temperature 98.1 F (36.7 C), temperature source Oral, resp. rate 19, height 6\' 1"  (1.854 m), weight 86.5 kg, SpO2 97%.        Intake/Output Summary (Last 24 hours) at 11/29/2022 1041 Last data filed at 11/29/2022 0900 Gross per 24 hour  Intake 1410 ml  Output 1350 ml  Net 60 ml   Filed Weights   11/21/22 1200 11/22/22 0847 11/26/22 0443  Weight: 78.5 kg 79.7 kg 86.5 kg   Chest tube output  200cc  Examination: Middle-age, does not appear to be in distress Right chest tube in place with blood-tinged drainage Clear breath sounds bilaterally S1-S2 appreciated Bowel sounds appreciated Extremities shows no clubbing, no edema  Resolved Hospital Problem list     Assessment & Plan:  Right-sided empyema, extensive pneumonia with parapneumonic effusion Did receive tPA Effluent now bloody -High risk of significant bleeding, will hold off on repeating tPA Continue chest tube monitoring  Will plan to remove chest tube once effluent is less than 200 cc over 24 hours  Will need long-term antibiotics  He does have a loculated effusion in between the pleural fissures which needs monitoring  Continue monitoring chest x-ray findings  Discussed fully with patient  Will follow  On anticoagulation for subsegmental PE  Smoking cessation counseling   Virl Diamond, MD [place] P[facility]M Pager: See Loretha Stapler

## 2022-11-29 NOTE — Assessment & Plan Note (Addendum)
Thyroid US with 2 cm nodule, recommended FNA biopsy. TSH WNL at 0.915.  - FNA biopsy outpatient

## 2022-11-29 NOTE — Assessment & Plan Note (Signed)
Hepatomegaly improved on exam. Abdominal exam benign this AM. Hepatitis panel and HIV Ab negative. No other symptoms. RUQ [place] with 7 mm hypoechoic mass in the left hepatic lobe. Spoke with Radiology, they feel it is likely a benign cyst, recommend follow up ultrasound in 3 months. LFTs WNL (21/18).  - Continue to monitor for clinical signs/symptoms

## 2022-11-29 NOTE — Assessment & Plan Note (Addendum)
Patient with 2/6 systolic flow murmur. Echo 10/9 with LVEF of 60-65% and no regional wall abnormalities. Patient asymptomatic.  - Continue to monitor vitals and symptoms

## 2022-11-29 NOTE — Assessment & Plan Note (Addendum)
Baseline Cr 1.4-1.5. Cr this AM of 1.38. AKI resolved.  - BMP in AM to trend renal function

## 2022-11-29 NOTE — Assessment & Plan Note (Addendum)
Hgb initially at 6.8 this AM, redraw with Hgb of 7.8. Patient with <200 cc of serosanguinous output in last 24 hours, however significant serosanguinous output 10/9 after pleural lytics. No other signs/symptoms consistent with active bleed. Iron studies/TIBC and ferritin consistent with anemia of chronic disease/IDA picture.  - Continue PO ferrous sulfate 325 mg daily - PM CBC to monitor Hgb given systemic anticoagulation for pulmonary embolism

## 2022-11-30 ENCOUNTER — Inpatient Hospital Stay (HOSPITAL_COMMUNITY): Payer: Medicare Other

## 2022-11-30 ENCOUNTER — Encounter (HOSPITAL_COMMUNITY): Payer: Self-pay | Admitting: Family Medicine

## 2022-11-30 DIAGNOSIS — I2699 Other pulmonary embolism without acute cor pulmonale: Secondary | ICD-10-CM | POA: Diagnosis not present

## 2022-11-30 DIAGNOSIS — J90 Pleural effusion, not elsewhere classified: Secondary | ICD-10-CM | POA: Diagnosis not present

## 2022-11-30 DIAGNOSIS — J189 Pneumonia, unspecified organism: Secondary | ICD-10-CM | POA: Diagnosis not present

## 2022-11-30 LAB — CBC
HCT: 24.7 % — ABNORMAL LOW (ref 39.0–52.0)
Hemoglobin: 7.7 g/dL — ABNORMAL LOW (ref 13.0–17.0)
MCH: 25.6 pg — ABNORMAL LOW (ref 26.0–34.0)
MCHC: 31.2 g/dL (ref 30.0–36.0)
MCV: 82.1 fL (ref 80.0–100.0)
Platelets: 618 10*3/uL — ABNORMAL HIGH (ref 150–400)
RBC: 3.01 MIL/uL — ABNORMAL LOW (ref 4.22–5.81)
RDW: 14.2 % (ref 11.5–15.5)
WBC: 16.4 10*3/uL — ABNORMAL HIGH (ref 4.0–10.5)
nRBC: 0 % (ref 0.0–0.2)

## 2022-11-30 LAB — BASIC METABOLIC PANEL
Anion gap: 7 (ref 5–15)
BUN: 17 mg/dL (ref 8–23)
CO2: 26 mmol/L (ref 22–32)
Calcium: 7.9 mg/dL — ABNORMAL LOW (ref 8.9–10.3)
Chloride: 104 mmol/L (ref 98–111)
Creatinine, Ser: 1.26 mg/dL — ABNORMAL HIGH (ref 0.61–1.24)
GFR, Estimated: >60 mL/min (ref >60)
Glucose, Bld: 92 mg/dL (ref 70–99)
Potassium: 4.9 mmol/L (ref 3.5–5.1)
Sodium: 137 mmol/L (ref 135–145)

## 2022-11-30 NOTE — Assessment & Plan Note (Signed)
Patient with 2/6 systolic flow murmur likely due to anemia. Echo 10/9 with LVEF of 60-65% and no regional wall abnormalities. Patient asymptomatic.  - Continue to monitor vitals and symptoms

## 2022-11-30 NOTE — Progress Notes (Signed)
Daily Progress Note Intern Pager: [phone]  Patient name: Stephen Lambert Medical record number: [id number] Date of birth: [birth year] Age: 62 y.o. Gender: male  Primary Care Provider: Alfredo Martinez, MD Consultants: CCM/Pulmonology  Code Status: DNR with Interventions    Pt Overview and Major Events to Date:  10/4: Admitted to FMTS 10/6: Chest tube placed   Assessment and Plan:   Stephen Lambert is a 62 yo M with PMHx of OUD (in remission, on methadone), and hypertension who presents here with pleural effusion and small pulmonary embolism in LLL.  Assessment & Plan Pneumonia  Empyema Remains on RA. Workup including acid fast smear, blood cultures, pleural cytology negative. CT on 10/9 with resolution of R posterior fluid pocket, but residual multilocular collections. CXR 10/11 with increased pleural fluid, chest tube remains in place.  - CCM consulted, appreciate recommendations - Continue chest tube for drainage per PCCM, can follow up CXR and possible removal today 10/12 - Outpatient Abx course of Augmentin x 4-6 weeks  - Continue IV Unasyn during inpatient stay  - Outpatient dental follow up - Toradol 15 mg q8h PRN - Tylenol 650 mg q6h scheduled - Supplemental O2 as needed  Pulmonary embolism (HCC) Small pulmonary embolism in LLL, no evidence of R heart strain on CT. Appears unprovoked, however, potential underlying malignancy could cause underlying hypercoagulability. Patient with drop in Hgb stable around 7.7, down from baseline between 9-10. Otherwise hemodynamically stable and appears well.  - Continue Eliquis 5 mg BID - Age appropriate cancer screening on discharge  Anemia Hgb stable though decreased from BL as above. Patient with <200 cc of serosanguinous output in last 24 hours, however significant serosanguinous output 10/9 after pleural lytics. No other signs/symptoms consistent with active bleed. Iron studies/TIBC and ferritin consistent with anemia of chronic  disease/IDA picture.  - Continue PO ferrous sulfate 325 mg daily AKI (acute kidney injury) (HCC) (Resolved: 11/30/2022) Baseline Cr 1.4-1.5. AKI resolved.  - BMP in AM to trend renal function  Systolic murmur Patient with 2/6 systolic flow murmur likely due to anemia. Echo 10/9 with LVEF of 60-65% and no regional wall abnormalities. Patient asymptomatic.  - Continue to monitor vitals and symptoms Thyroid mass Thyroid US with 2 cm nodule, recommended FNA biopsy. TSH WNL at 0.915.  - FNA biopsy outpatient Hepatomegaly Hepatomegaly improved on exam. Abdominal exam benign this AM. Hepatitis panel and HIV Ab negative. No other symptoms. RUQ [place] with 7 mm hypoechoic mass in the left hepatic lobe. Spoke with Radiology, they feel it is likely a benign cyst, recommend follow up ultrasound in 3 months. LFTs WNL (21/18).  - Continue to monitor for clinical signs/symptoms   Chronic and Stable Issues: HTN: Holding home Amlodipine, lisinopril/hydrochlorothiazide 20-25 daily in setting of soft BP and AKI during admission   FEN/GI: Regular Diet PPx: Lovenox Dispo: Home pending clinical improvement   Subjective:  Doing well this morning.  He is ready to go home whenever he can.  He has no concerns on my interview, but he does mention to Dr. Lum Babe some bilateral lower leg swelling that is been going on for about 4 days.  Objective: Temp:  [98.1 F (36.7 C)-98.5 F (36.9 C)] 98.5 F (36.9 C) (10/12 0422) Pulse Rate:  [58-64] 61 (10/12 0422) Resp:  [16-20] 20 (10/12 0422) BP: (109-133)/(61-71) 133/71 (10/12 0422) SpO2:  [97 %-100 %] 97 % (10/12 0422) Physical Exam: General: Sitting on the side of the bed, no acute distress Cardiovascular: Regular rate  and rhythm without murmurs rubs or gallops Respiratory: Mild crackles in right lower lung field, otherwise clear Abdomen: Nondistended, normoactive bowel sounds Extremities: Moves all extremities grossly equally  Laboratory: Most recent CBC Lab  Results  Component Value Date   WBC 16.4 (H) 11/30/2022   HGB 7.7 (L) 11/30/2022   HCT 24.7 (L) 11/30/2022   MCV 82.1 11/30/2022   PLT 618 (H) 11/30/2022   Most recent BMP    Latest Ref Rng & Units 11/30/2022    2:14 AM  BMP  Glucose 70 - 99 mg/dL 92   BUN 8 - 23 mg/dL 17   Creatinine 1.61 - 1.24 mg/dL 0.96   Sodium 045 - 409 mmol/L 137   Potassium 3.5 - 5.1 mmol/L 4.9   Chloride 98 - 111 mmol/L 104   CO2 22 - 32 mmol/L 26   Calcium 8.9 - 10.3 mg/dL 7.9    Imaging/Diagnostic Tests: CXR 10/11 IMPRESSION: Stable position of right-sided chest tube without pneumothorax. Right pleural effusion appears to be slightly enlarged with associated atelectasis or infiltrate.  Evette Georges, MD 11/30/2022, 7:55 AM  PGY-2, [facility] FPTS Intern pager: [phone], text pages welcome Secure chat group [facility] Teaching Service

## 2022-11-30 NOTE — Plan of Care (Signed)

## 2022-11-30 NOTE — Progress Notes (Signed)
Chest tube was discontinued uneventfully  May transition to oral antibiotics 12/01/2022 and if no fevers, chest x-ray stable  Anticipate being able to discharge 10/14

## 2022-11-30 NOTE — Progress Notes (Signed)
Chest tube removed by CCMD, at bedside. No complications.

## 2022-11-30 NOTE — Assessment & Plan Note (Signed)
Hgb stable though decreased from BL as above. Patient with <200 cc of serosanguinous output in last 24 hours, however significant serosanguinous output 10/9 after pleural lytics. No other signs/symptoms consistent with active bleed. Iron studies/TIBC and ferritin consistent with anemia of chronic disease/IDA picture.  - Continue PO ferrous sulfate 325 mg daily

## 2022-11-30 NOTE — Assessment & Plan Note (Addendum)
Remains on RA. Workup including acid fast smear, blood cultures, pleural cytology negative. CT on 10/9 with resolution of R posterior fluid pocket, but residual multilocular collections. CXR 10/11 with increased pleural fluid, chest tube remains in place.  - CCM consulted, appreciate recommendations - Continue chest tube for drainage per PCCM, can follow up CXR and possible removal today 10/12 - Outpatient Abx course of Augmentin x 4-6 weeks  - Continue IV Unasyn during inpatient stay  - Outpatient dental follow up - Toradol 15 mg q8h PRN - Tylenol 650 mg q6h scheduled - Supplemental O2 as needed

## 2022-11-30 NOTE — Assessment & Plan Note (Signed)
Small pulmonary embolism in LLL, no evidence of R heart strain on CT. Appears unprovoked, however, potential underlying malignancy could cause underlying hypercoagulability. Patient with drop in Hgb stable around 7.7, down from baseline between 9-10. Otherwise hemodynamically stable and appears well.  - Continue Eliquis 5 mg BID - Age appropriate cancer screening on discharge

## 2022-11-30 NOTE — Assessment & Plan Note (Signed)
Baseline Cr 1.4-1.5. AKI resolved.  - BMP in AM to trend renal function

## 2022-11-30 NOTE — Assessment & Plan Note (Signed)
Thyroid US with 2 cm nodule, recommended FNA biopsy. TSH WNL at 0.915.  - FNA biopsy outpatient

## 2022-11-30 NOTE — Assessment & Plan Note (Signed)
Hepatomegaly improved on exam. Abdominal exam benign this AM. Hepatitis panel and HIV Ab negative. No other symptoms. RUQ [place] with 7 mm hypoechoic mass in the left hepatic lobe. Spoke with Radiology, they feel it is likely a benign cyst, recommend follow up ultrasound in 3 months. LFTs WNL (21/18).  - Continue to monitor for clinical signs/symptoms

## 2022-11-30 NOTE — Progress Notes (Signed)
   NAME:  Stephen Lambert, MRN:  [id number], DOB:  [birth year], LOS: 8 ADMISSION DATE:  11/21/2022, CONSULTATION DATE:  10/5 REFERRING MD:  Linwood Dibbles, CHIEF COMPLAINT:  complicated pleural effusion    History of Present Illness:  62 year old male w/ hx as outlined below. Admitted 10/3 w/ 3 weeks of progressive SOB, abd discomfort, fatigue and wt loss. On eval in ER: wbc 32, CXR w/ near complete opacification of right hemithorax w/ F/U CT imaging noting: large loculated right pleural effusion w/ several areas of loculation, also pleural thickening, RLL ATX/collapse, R>L airspace disease AND small non-occlusive LLL Pulmonary emboli.  Was started on IV antibiotics, IV hydration, therapeutic LMWH. On 10/4 underwent diagnostic thora. Only able to pull hazy/yellow fluid. Pleural diagnostics were sent. PCCM asked to see for loculated pleural effusion on 10/5 Pertinent  Medical History  HTN, chronic pain on methadone, prior 1/2ppd smoker x 38yrs  Significant Hospital Events: Including procedures, antibiotic start and stop dates in addition to other pertinent events   10/3 admitted w/PNA, loculated right effusion and small non-occlusive left PE. Started on ABX and [facility] 10/4 right thoracentesis. removed. LDH 5203, protein 4, glucose < 20, cytology and AFB sent.  10/5 PCCM asked to eval given complicated exudative right effusion  10/11-still with over 200 cc 10/12-no significant output in chest tube  Interim History / Subjective:  No overnight events Breathing continues to be comfortable  Objective   Blood pressure 128/67, pulse (!) 57, temperature 98 F (36.7 C), resp. rate 17, height 6\' 1"  (1.854 m), weight 86.5 kg, SpO2 98%.        Intake/Output Summary (Last 24 hours) at 11/30/2022 0936 Last data filed at 11/30/2022 0732 Gross per 24 hour  Intake 950 ml  Output 2240 ml  Net -1290 ml   Filed Weights   11/21/22 1200 11/22/22 0847 11/26/22 0443  Weight: 78.5 kg 79.7 kg 86.5 kg    Chest tube output 200cc  Examination: Middle-age, does not appear to be in distress Right chest tube in place Good breath sounds bilaterally S1-S2 appreciated Bowel sounds appreciated Extremities shows no clubbing, no edema  Resolved Hospital Problem list     Assessment & Plan:  Right-sided empyema with extensive pneumonia with parapneumonic effusion Did receive tPA, effluent did turn a little bloody -There is significant risk of bleeding with continuing to administer tPA  No significant output in the last 24 hours  He remains on Unasyn, stable leukocytosis  Will repeat chest x-ray today and if no significant buildup of effusion, will plan to discontinue tube today  Will require long-term antibiotics Augmentin  Continue other current treatment  Smoking cessation counseling  On anticoagulation for subsegmental PE  Virl Diamond, MD [place] [facility] Pager: See Loretha Stapler

## 2022-12-01 ENCOUNTER — Telehealth: Payer: Self-pay | Admitting: Pulmonary Disease

## 2022-12-01 ENCOUNTER — Inpatient Hospital Stay (HOSPITAL_COMMUNITY): Payer: Medicare Other

## 2022-12-01 DIAGNOSIS — R600 Localized edema: Secondary | ICD-10-CM | POA: Insufficient documentation

## 2022-12-01 DIAGNOSIS — J90 Pleural effusion, not elsewhere classified: Secondary | ICD-10-CM | POA: Diagnosis not present

## 2022-12-01 LAB — BASIC METABOLIC PANEL
Anion gap: 10 (ref 5–15)
BUN: 13 mg/dL (ref 8–23)
CO2: 26 mmol/L (ref 22–32)
Calcium: 8.3 mg/dL — ABNORMAL LOW (ref 8.9–10.3)
Chloride: 100 mmol/L (ref 98–111)
Creatinine, Ser: 1.17 mg/dL (ref 0.61–1.24)
GFR, Estimated: >60 mL/min (ref >60)
Glucose, Bld: 80 mg/dL (ref 70–99)
Potassium: 4.8 mmol/L (ref 3.5–5.1)
Sodium: 136 mmol/L (ref 135–145)

## 2022-12-01 MED ORDER — AMOXICILLIN-POT CLAVULANATE 875-125 MG PO TABS
1.0000 | ORAL_TABLET | Freq: Two times a day (BID) | ORAL | Status: DC
  Administered 2022-12-01 – 2022-12-02 (×3): 1 via ORAL
  Filled 2022-12-01 (×3): qty 1

## 2022-12-01 NOTE — Plan of Care (Signed)
  Problem: Education: Goal: Knowledge of General Education information will improve Description: Including pain rating scale, medication(s)/side effects and non-pharmacologic comfort measures Outcome: Progressing   Problem: Clinical Measurements: Goal: Ability to maintain clinical measurements within normal limits will improve Outcome: Progressing Goal: Will remain free from infection Outcome: Progressing Goal: Diagnostic test results will improve Outcome: Progressing Goal: Respiratory complications will improve Outcome: Progressing Goal: Cardiovascular complication will be avoided Outcome: Progressing   Problem: Health Behavior/Discharge Planning: Goal: Ability to manage health-related needs will improve Outcome: Progressing   Problem: Nutrition: Goal: Adequate nutrition will be maintained Outcome: Progressing   Problem: Elimination: Goal: Will not experience complications related to bowel motility Outcome: Progressing   Problem: Elimination: Goal: Will not experience complications related to bowel motility Outcome: Progressing Goal: Will not experience complications related to urinary retention Outcome: Progressing

## 2022-12-01 NOTE — Assessment & Plan Note (Addendum)
Hepatomegaly improved on exam. Hepatitis panel and HIV Ab negative. No other symptoms. RUQ [place] with 7 mm hypoechoic mass in the left hepatic lobe. Spoke with Radiology, they feel it is likely a benign cyst, recommend follow up ultrasound in 3 months. LFTs WNL (21/18).  - Continue to monitor for clinical signs/symptoms

## 2022-12-01 NOTE — Assessment & Plan Note (Addendum)
Noted on 10/12. Differential includes renal disease, CHF, hepatic disease, and medication ADE. Cr and BUN now WNL. Echo with normal EF. Negative hepatitis panel on admission. -leg elevation and compression stockings -Lasix if not improving

## 2022-12-01 NOTE — Assessment & Plan Note (Signed)
Thyroid US with 2 cm nodule, recommended FNA biopsy. TSH WNL at 0.915.  - FNA biopsy outpatient

## 2022-12-01 NOTE — Telephone Encounter (Signed)
Schedule for follow-up appointment in about 4 weeks  Plan is for discharge 12/02/2022  Will need chest x-ray on the day of next visit  May see physician or APP on the day of next visit

## 2022-12-01 NOTE — Progress Notes (Signed)
Daily Progress Note Intern Pager: [phone]  Patient name: Stephen Lambert Medical record number: [id number] Date of birth: 10-07-1960 Age: 62 y.o. Gender: male  Primary Care Provider: Alfredo Martinez, MD Consultants: CCM Code Status: full  Pt Overview and Major Events to Date:  10/4: Admitted to FMTS with pneumonia and small PE, R thoracentesis by CCM 10/6: Chest tube placed 10/12: Chest tube removed  Assessment and Plan: Stephen Lambert is a 31 yo M with PMHx of OUD (in remission, on methadone), and hypertension admitted for pneumonia/empyema with a loculated pleural effusion as well as a small pulmonary embolism. S/p chest tube and remains on IV unasyn. On Eliquis for subsegmental PE. Possible d/c home tomorrow.  Assessment & Plan Pneumonia  Empyema Remains on RA. Workup including acid fast smear, blood cultures, pleural cytology negative. Chest tube placed 10/6. CT on 10/9 with resolution of R posterior fluid pocket, but residual multilocular collections. Decreased chest tube output and CXR stable 10/12, chest tube removed by CCM. - CCM consulted, appreciate recommendations  - Continue IV Unasyn during inpatient stay  - Continue monitoring with CXR - Outpatient Abx course of Augmentin x 4-6 weeks  - Outpatient dental follow up - Toradol 15 mg q8h PRN - Tylenol 650 mg q6h scheduled - Supplemental O2 as needed  Pulmonary embolism (HCC) Small pulmonary embolism in LLL, no evidence of R heart strain on CT. Appears unprovoked, however, potential underlying malignancy could cause underlying hypercoagulability. Patient with drop in Hgb stable around 7.7, down from baseline between 9-10. Otherwise hemodynamically stable and appears well.  - Continue Eliquis 5 mg BID - consult TOC pharmacy Monday AM for medication assistance, may qualify for 30 day free card - Age appropriate cancer screening on discharge  Anemia Hgb stable though decreased from baseline as above. Had <200 cc of  serosanguinous output in chest tube prior to removal, however significant serosanguinous output 10/9 after pleural lytics. No other signs/symptoms consistent with active bleed. Iron studies/TIBC and ferritin consistent with anemia of chronic disease/IDA picture.  - Continue PO ferrous sulfate 325 mg daily Systolic murmur Patient with 2/6 systolic flow murmur likely due to anemia. Echo 10/9 with LVEF of 60-65% and no regional wall abnormalities. Patient asymptomatic.  - Continue to monitor vitals and symptoms Thyroid mass Thyroid US with 2 cm nodule, recommended FNA biopsy. TSH WNL at 0.915.  - FNA biopsy outpatient Hepatomegaly Hepatomegaly improved on exam. Hepatitis panel and HIV Ab negative. No other symptoms. RUQ [place] with 7 mm hypoechoic mass in the left hepatic lobe. Spoke with Radiology, they feel it is likely a benign cyst, recommend follow up ultrasound in 3 months. LFTs WNL (21/18).  - Continue to monitor for clinical signs/symptoms  Lower extremity edema Noted on 10/12. Differential includes renal disease, CHF, hepatic disease, and medication ADE. Cr and BUN now WNL. Echo with normal EF. Negative hepatitis panel on admission. -leg elevation and compression stockings -Lasix if not improving   Chronic and Stable Issues: HTN: Holding home Amlodipine, lisinopril/hydrochlorothiazide 20-25 daily in setting of soft BP and AKI during admission   FEN/GI: regular PPx: on eliquis Dispo: pending clinical improvement  Subjective:  Pt states he is feeling pretty good this morning, not having any trouble breathing and is glad to have the chest tube out. States he is still having some lower leg swelling but the nurse is about to give him compression stockings. No questions or concerns at this time.  Objective: Temp:  [98.2 F (36.8  C)-99 F (37.2 C)] 98.2 F (36.8 C) (10/13 0744) Pulse Rate:  [57-64] 61 (10/13 0744) Resp:  [17-20] 18 (10/13 0744) BP: (110-128)/(61-73) 128/73 (10/13  0744) SpO2:  [98 %] 98 % (10/13 0604) Physical Exam: General: middle aged male sitting on side of the bed reading the newspaper, pleasant, conversant and in NAD Cardiovascular: RRR, normal S1/S2 Respiratory: somewhat diminished breath sounds in RLL, CTAB otherwise without wheezes, rales, or rhonchi Abdomen: normoactive bowel sounds, soft, nontender, nondistended Extremities: no edema to BLE  Laboratory: Most recent CBC Lab Results  Component Value Date   WBC 16.4 (H) 11/30/2022   HGB 7.7 (L) 11/30/2022   HCT 24.7 (L) 11/30/2022   MCV 82.1 11/30/2022   PLT 618 (H) 11/30/2022   Most recent BMP    Latest Ref Rng & Units 12/01/2022    5:23 AM  BMP  Glucose 70 - 99 mg/dL 80   BUN 8 - 23 mg/dL 13   Creatinine 2.72 - 1.24 mg/dL 5.36   Sodium 644 - 034 mmol/L 136   Potassium 3.5 - 5.1 mmol/L 4.8   Chloride 98 - 111 mmol/L 100   CO2 22 - 32 mmol/L 26   Calcium 8.9 - 10.3 mg/dL 8.3      Imaging/Diagnostic Tests: CXR 1. Interval removal of right pleural drain. 2. Persistent large right pleural collection and right lower lobe airspace disease.   PGY-1, [facility] FPTS Intern pager: [phone], text pages welcome Secure chat group [facility] Teaching Service

## 2022-12-01 NOTE — Assessment & Plan Note (Signed)
Patient with 2/6 systolic flow murmur likely due to anemia. Echo 10/9 with LVEF of 60-65% and no regional wall abnormalities. Patient asymptomatic.  - Continue to monitor vitals and symptoms

## 2022-12-01 NOTE — Assessment & Plan Note (Signed)
Small pulmonary embolism in LLL, no evidence of R heart strain on CT. Appears unprovoked, however, potential underlying malignancy could cause underlying hypercoagulability. Patient with drop in Hgb stable around 7.7, down from baseline between 9-10. Otherwise hemodynamically stable and appears well.  - Continue Eliquis 5 mg BID - consult TOC pharmacy Monday AM for medication assistance, may qualify for 30 day free card - Age appropriate cancer screening on discharge

## 2022-12-01 NOTE — Plan of Care (Signed)
  Problem: Pain Managment: Goal: General experience of comfort will improve Outcome: Progressing   Problem: Safety: Goal: Ability to remain free from injury will improve Outcome: Progressing   Problem: Skin Integrity: Goal: Risk for impaired skin integrity will decrease Outcome: Progressing   

## 2022-12-01 NOTE — Assessment & Plan Note (Signed)
Remains on RA. Workup including acid fast smear, blood cultures, pleural cytology negative. Chest tube placed 10/6. CT on 10/9 with resolution of R posterior fluid pocket, but residual multilocular collections. Decreased chest tube output and CXR stable 10/12, chest tube removed by CCM. - CCM consulted, appreciate recommendations  - Continue IV Unasyn during inpatient stay  - Continue monitoring with CXR - Outpatient Abx course of Augmentin x 4-6 weeks  - Outpatient dental follow up - Toradol 15 mg q8h PRN - Tylenol 650 mg q6h scheduled - Supplemental O2 as needed

## 2022-12-01 NOTE — Assessment & Plan Note (Signed)
Hgb stable though decreased from baseline as above. Had <200 cc of serosanguinous output in chest tube prior to removal, however significant serosanguinous output 10/9 after pleural lytics. No other signs/symptoms consistent with active bleed. Iron studies/TIBC and ferritin consistent with anemia of chronic disease/IDA picture.  - Continue PO ferrous sulfate 325 mg daily

## 2022-12-01 NOTE — Progress Notes (Signed)
   NAME:  Stephen Lambert, MRN:  [id number], DOB:  1960/12/20, LOS: 9 ADMISSION DATE:  11/21/2022, CONSULTATION DATE:  10/5 REFERRING MD:  Linwood Dibbles, CHIEF COMPLAINT:  complicated pleural effusion    History of Present Illness:  62 year old male w/ hx as outlined below. Admitted 10/3 w/ 3 weeks of progressive SOB, abd discomfort, fatigue and wt loss. On eval in ER: wbc 32, CXR w/ near complete opacification of right hemithorax w/ F/U CT imaging noting: large loculated right pleural effusion w/ several areas of loculation, also pleural thickening, RLL ATX/collapse, R>L airspace disease AND small non-occlusive LLL Pulmonary emboli.  Was started on IV antibiotics, IV hydration, therapeutic LMWH. On 10/4 underwent diagnostic thora. Only able to pull hazy/yellow fluid. Pleural diagnostics were sent. PCCM asked to see for loculated pleural effusion on 10/5 Pertinent  Medical History  HTN, chronic pain on methadone, prior 1/2ppd smoker x 9yrs  Significant Hospital Events: Including procedures, antibiotic start and stop dates in addition to other pertinent events   10/3 admitted w/PNA, loculated right effusion and small non-occlusive left PE. Started on ABX and [facility] 10/4 right thoracentesis. removed. LDH 5203, protein 4, glucose < 20, cytology and AFB sent.  10/5 PCCM asked to eval given complicated exudative right effusion  10/11-still with over 200 cc 10/12-no significant output in chest tube 11/30/2022-chest tube discontinued  Interim History / Subjective:  No overnight events Breathing is comfortable  Objective   Blood pressure 128/73, pulse 61, temperature 98.2 F (36.8 C), temperature source Oral, resp. rate 18, height 6\' 1"  (1.854 m), weight 86.5 kg, SpO2 98%.        Intake/Output Summary (Last 24 hours) at 12/01/2022 1117 Last data filed at 11/30/2022 1700 Gross per 24 hour  Intake 200 ml  Output --  Net 200 ml   Filed Weights   11/21/22 1200 11/22/22 0847 11/26/22 0443   Weight: 78.5 kg 79.7 kg 86.5 kg   Chest tube discontinued 11/30/2022  Examination: Middle-age gentleman does not appear to be in distress Decreased air entry right base Bowel sounds appreciated S1-S2 appreciated Extremities shows no clubbing, no edema  Resolved Hospital Problem list     Assessment & Plan:   Right-sided empyema with extensive pneumonia and parapneumonic effusion Did receive tPA, effluent did turn bloody -Chest tube was discontinued 11/30/2022 -He remains clinically stable  Was on Unasyn, transition to Augmentin  Will require long-term antibiotics for about 4 weeks Will make plans to see him in the office in about 4 weeks  Smoking cessation counseling  Continue other current treatments  On anticoagulation for subsegmental PE  Okay to plan for discharge 12/02/2022  Virl Diamond, MD [place] [facility] Pager: See Loretha Stapler

## 2022-12-02 ENCOUNTER — Other Ambulatory Visit (HOSPITAL_COMMUNITY): Payer: Self-pay

## 2022-12-02 DIAGNOSIS — J90 Pleural effusion, not elsewhere classified: Secondary | ICD-10-CM | POA: Diagnosis not present

## 2022-12-02 LAB — CBC
HCT: 24.4 % — ABNORMAL LOW (ref 39.0–52.0)
Hemoglobin: 7.6 g/dL — ABNORMAL LOW (ref 13.0–17.0)
MCH: 26.1 pg (ref 26.0–34.0)
MCHC: 31.1 g/dL (ref 30.0–36.0)
MCV: 83.8 fL (ref 80.0–100.0)
Platelets: 750 10*3/uL — ABNORMAL HIGH (ref 150–400)
RBC: 2.91 MIL/uL — ABNORMAL LOW (ref 4.22–5.81)
RDW: 14.2 % (ref 11.5–15.5)
WBC: 15.4 10*3/uL — ABNORMAL HIGH (ref 4.0–10.5)
nRBC: 0 % (ref 0.0–0.2)

## 2022-12-02 MED ORDER — AMOXICILLIN-POT CLAVULANATE 875-125 MG PO TABS
1.0000 | ORAL_TABLET | Freq: Two times a day (BID) | ORAL | 0 refills | Status: DC
  Filled 2022-12-02: qty 58, 29d supply, fill #0

## 2022-12-02 MED ORDER — APIXABAN 5 MG PO TABS
5.0000 mg | ORAL_TABLET | Freq: Two times a day (BID) | ORAL | 0 refills | Status: DC
  Filled 2022-12-02: qty 60, 30d supply, fill #0

## 2022-12-02 MED ORDER — SENNA 8.6 MG PO TABS: 1.0000 | ORAL_TABLET | Freq: Every day | ORAL | Status: DC

## 2022-12-02 MED ORDER — POLYETHYLENE GLYCOL 3350 17 G PO PACK: 17.0000 g | PACK | Freq: Two times a day (BID) | ORAL | Status: DC

## 2022-12-02 MED ORDER — ACETAMINOPHEN 325 MG PO TABS: 650.0000 mg | ORAL_TABLET | Freq: Four times a day (QID) | ORAL | Status: DC | PRN

## 2022-12-02 MED ORDER — FERROUS SULFATE 325 (65 FE) MG PO TABS
325.0000 mg | ORAL_TABLET | Freq: Every day | ORAL | 3 refills | Status: DC
  Filled 2022-12-02 – 2022-12-26 (×2): qty 30, 30d supply, fill #0
  Filled 2023-01-27: qty 30, 30d supply, fill #1
  Filled 2023-02-26: qty 30, 30d supply, fill #2

## 2022-12-02 NOTE — Assessment & Plan Note (Deleted)
Stable after removal of chest tube over the weekend. Continues to have good oxygen saturations on room air.  - CCM consulted, appreciate recommendations  - Continue IV Unasyn during inpatient stay  - Continue monitoring with CXR - Outpatient Abx course of Augmentin x 4-6 weeks  - Outpatient dental follow up - Toradol 15 mg q8h PRN - Tylenol 650 mg q6h scheduled - Supplemental O2 as needed

## 2022-12-02 NOTE — Assessment & Plan Note (Deleted)
Patient with 2/6 systolic flow murmur likely due to anemia. Echo 10/9 with LVEF of 60-65% and no regional wall abnormalities. Patient asymptomatic.  - Continue to monitor vitals and symptoms

## 2022-12-02 NOTE — Assessment & Plan Note (Deleted)
Hgb stable though decreased from baseline as above. Had <200 cc of serosanguinous output in chest tube prior to removal, however significant serosanguinous output 10/9 after pleural lytics. No other signs/symptoms consistent with active bleed. Iron studies/TIBC and ferritin consistent with anemia of chronic disease/IDA picture.  - Continue PO ferrous sulfate 325 mg daily

## 2022-12-02 NOTE — Assessment & Plan Note (Deleted)
Thyroid US with 2 cm nodule, recommended FNA biopsy. TSH WNL at 0.915.  - FNA biopsy outpatient

## 2022-12-02 NOTE — Assessment & Plan Note (Deleted)
Small pulmonary embolism in LLL, no evidence of R heart strain on CT. Appears unprovoked, however, potential underlying malignancy could cause underlying hypercoagulability. AM Hgb ***, down from baseline between 9-10. Otherwise hemodynamically stable and appears well.  - Continue Eliquis 5 mg BID - Plan for prescription*** - Age appropriate cancer screening on discharge

## 2022-12-02 NOTE — Care Management Important Message (Signed)
Important Message  Patient Details  Name: Stephen Lambert MRN: [id number] Date of Birth: October 29, 1960   Important Message Given:  Yes - Medicare IM     Sherilyn Banker 12/02/2022, 2:07 PM

## 2022-12-02 NOTE — Discharge Instructions (Addendum)
Dear Stephen Lambert,  Thank you for letting us participate in your care. You were hospitalized for shortness of breath and diagnosed with Pleural effusion and small pulmonary embolism. You were treated with blood thinners, antibiotics, steroids, and a chest tube to drain the fluid with improvement.   POST-HOSPITAL & CARE INSTRUCTIONS Be sure to continue all of your medications as listed in this packet. Go to your follow up appointments (listed below). The pulmonology service will call you to schedule an appointment in 4 weeks. Let your PCP know if you do not get a call about this.  DOCTOR'S APPOINTMENT   Future Appointments  Date Time Provider Department Center  10/03/2023  2:00 PM [facility] ANNUAL WELLNESS VISIT [facility]    Follow-up Information     Alfredo Martinez, MD. Schedule an appointment as soon as possible for a visit.   Specialty: Family Medicine Why: Make an appointment ASAP for hospital follow up. Contact information: [address] [place] [address] [phone]         Tomma Lightning, MD. Go to.   Specialty: Pulmonary Disease Why: Go to the appointment they call you to schedule in 4 weeks. Let us know if you do not get a call from them. You will get a follow up chest xray at this visit. Contact information: [address] [place] [address] [phone]                 Take care and be well!  Family Medicine Teaching Service Inpatient Team [place]  [facility]  [address] [place], [place] [address] [phone]   Information on my medicine - ELIQUIS (apixaban)  Why was Eliquis prescribed for you? Eliquis was prescribed to treat blood clots that may have been found in the veins of your legs (deep vein thrombosis) or in your lungs (pulmonary embolism) and to reduce the risk of them occurring again.  What do You need to know about Eliquis ? The dose is ONE 5 mg tablet taken TWICE daily.   Eliquis may be taken with or without food.   Try to take the dose about the same time in the morning and in the evening. If you have difficulty swallowing the tablet whole please discuss with your pharmacist how to take the medication safely.  Take Eliquis exactly as prescribed and DO NOT stop taking Eliquis without talking to the doctor who prescribed the medication.  Stopping may increase your risk of developing a new blood clot.  Refill your prescription before you run out.  After discharge, you should have regular check-up appointments with your healthcare provider that is prescribing your Eliquis.    What do you do if you miss a dose? If a dose of ELIQUIS is not taken at the scheduled time, take it as soon as possible on the same day and twice-daily administration should be resumed. The dose should not be doubled to make up for a missed dose.  Important Safety Information A possible side effect of Eliquis is bleeding. You should call your healthcare provider right away if you experience any of the following: Bleeding from an injury or your nose that does not stop. Unusual colored urine (red or dark brown) or unusual colored stools (red or black). Unusual bruising for unknown reasons. A serious fall or if you hit your head (even if there is no bleeding).  Some medicines may interact with Eliquis and might increase your risk of bleeding  or clotting while on Eliquis. To help avoid this, consult your healthcare provider or pharmacist prior to using any new prescription or non-prescription medications, including herbals, vitamins, non-steroidal anti-inflammatory drugs (NSAIDs) and supplements.  This website has more information on Eliquis (apixaban): http://www.eliquis.com/eliquis/home

## 2022-12-02 NOTE — Assessment & Plan Note (Deleted)
Noted on 10/12. Differential includes renal disease, CHF, hepatic disease, and medication ADE. Cr and BUN now WNL. Echo with normal EF. Negative hepatitis panel on admission. -leg elevation and compression stockings -Lasix if not improving

## 2022-12-02 NOTE — Progress Notes (Deleted)
In error

## 2022-12-02 NOTE — Discharge Summary (Addendum)
Family Medicine Teaching [facility] Discharge Summary  Patient name: Stephen Lambert Medical record number: [id number] Date of birth: [birth year] Age: 62 y.o. Gender: male Date of Admission: 11/21/2022  Date of Discharge: 12/02/22 Admitting Physician: Evette Georges, MD  Primary Care Provider: Alfredo Martinez, MD Consultants: [facility]CM  Indication for Hospitalization: pneumonia  Discharge Diagnoses/Problem List:  Principal Problem for Admission: pneumonia Other Problems addressed during stay:  Principal Problem:   Pneumonia  Empyema Active Problems:   Methadone use   Pulmonary embolism (HCC)   Thyroid mass   Leukocytosis   Hypokalemia   Hepatomegaly   Anemia   Constipation   Systolic murmur   Need for management of chest tube   Empyema (HCC)   Pneumonia of right lung due to infectious organism   Lower extremity edema    Brief Hospital Course:  Stephen Lambert is a 62 yo M with past medical history of OUD (in remission, on methadone), and hypertension who was admitted to [facility] to the [facility] Teaching Service on 11/21/2022 with increased dyspnea on exertion, found to have a large R pleural effusion.   His hospital course is as follows:  Pleural Effusion  Presented with increased dyspnea on exertion, indolent course, has been progressively worsening over past several months. Found to have moderate sized loculated R pleural effusion with thickened pleura on CTA chest. Hemodynamically stable, satting well on RA. Cardipulmonary exam remarkable for diminished breath sounds on R side. Workup including hepatitis panel, HIV Ab, lactate negative. AFB smear negative. Patient with marked leukocytosis and loculation of effusion, suggestive of infectious process. Thoracentesis scheduled completed with minimal output, chest tube placed on 10/6 initially with ~1L thick yellow output, then serosanguinous output. Pleural lytics administered per CCM. Pleural fluid  cytology with no malignant cells. Pleural fluid culture 10/6 with strep intermedius. Treated with IV Unasyn (10/5 - 10/13), and Azithromycin (10/4-10/7).  Chest tube ultimately removed on 10/12 given improvement in volume of drainage.  He was discharged on Augmentin for prolonged course of 4-6 weeks per CCM recommendations.   Pulmonary Embolism Small pulmonary embolism noted in LLL with no evidence of R heart strain or lung infarction on CT. Patient remained hemodynamically stable during admission. Started on heparin gtt, however heparin level undetectable on labwork on 10/4. Heparin gtt discontinued, systemic anticoagulation was held 10/6 in the setting of administered pleural lytics. Resumed systemic anticoagulation with therapeutic Lovenox 10/7. Continue DOAC on discharge with Eliquis 5mg  BID. Recommend follow up for continued management of anticoagulation in the setting of pulmonary embolism.   Liver Mass  Hepatomegaly Patient with some hepatomegaly on admission and RUQ discomfort, thought to also be related to referred intrathoracic pain from R pleural effusion. LFTs WNL. RUQ with 7 mm hypoechoic mass in L hepatic lobe. Hepatitis panel and HIV Ab negative. Recommend outpatient follow up for further workup regarding liver mass.  Thyroid Nodule  Presented with palpable mass deep to R thyroid along lateral trachea. Thyroid US with 2 cm nodule. TSH WNL at 0.915.    Anemia Hgb on admission of 10.1, downtrended steadily and was 7.6 by the date of discharge. Anemia workup consistent with anemia of chronic disease and iron deficiency anemia, patient initiated on PO iron during stay.    Bilateral lower extremity edema Presented over admission.  Echo with some component of potential diastolic CHF.  Hepatic panel reassuring and kidney function improved over admission. Improved by date of discharge.  Other chronic conditions were medically managed with  home medications and formulary alternatives as  necessary: - Hypertension - held home amlodipine, lisinopril/hydrochlorothiazide in setting of soft BP and AKI - OUD - continued on home methadone 80mg  daily, MiraLAX as needed.  PCP Follow Up Recommendations: Obtain repeat RUQ [place] in 3 months to follow up hypoechoic liver mass. Obtain dental follow up given pleural cultures with Strep Intermedius  F/u BLE swelling Follow up BP and add back amlodipine and lisinopril-hydrochlorothiazide as tolerated. Repeat CBC given anemia over admission Recommend FNA biopsy outpatient.   Disposition: home  Discharge Condition: stable  Discharge Exam:  Vitals:   12/02/22 0352 12/02/22 0847  BP: 118/66 137/66  Pulse: (!) 58 68  Resp: 18 17  Temp: 98.1 F (36.7 C)   SpO2: 98% 100%   Gen: middle aged male sitting on edge of the bed, pleasant and conversant, in NAD CV: RRR, normal S1/S2, no murmurs, rubs, or gallops Pulm: CTAB, normal WOB on RA Abd: normoactive bowel sounds, soft, nontender, nondistended Ext: 1+ pitting edema to distal shins bilaterally, R>L  Significant Procedures: Echo (EF 60-65%), thoracentesis, chest tube insertion and removal  Significant Labs and Imaging:  Recent Labs  Lab 12/02/22 0910  WBC 15.4*  HGB 7.6*  HCT 24.4*  PLT 750*   Recent Labs  Lab 12/01/22 0523  NA 136  K 4.8  CL 100  CO2 26  GLUCOSE 80  BUN 13  CREATININE 1.17  CALCIUM 8.3*   Initial CXR (10/3) Large right lung opacity is noted concerning for pneumonia or atelectasis with associated pleural effusion. Underlying mass cannot be excluded.  Final CXR (10/13) 1. Interval removal of right pleural drain. 2. Persistent large right pleural collection and right lower lobe airspace disease.  CT angio (10/3) 1. Small volume pulmonary emboli in the left lung lower lobe and inferior lingula. No CT evidence of right heart strain or lung infarction. 2. Moderate-sized loculated right pleural/fissural effusion with thickened and hyperattenuating  pleura. Findings are favored to represent empyema. Consider thoracentesis and cytology evaluation to exclude underlying neoplastic process. 3. Patchy ground-glass changes with smooth interlobular septal thickening in the right upper lobe and patchy such areas throughout the left lung. There is also mosaic attenuation in the left lung. Findings are nonspecific but may represent multilobar pneumonia/atypical pneumonia with or without superimposed small airway disease. 4. Indeterminate lesion posterior to the right thyroid lobe. Consider nonemergent thyroid ultrasound. 5. Multiple other nonacute observations, as described above. 6. No discrete metastatic process identified within the abdomen or pelvis.  Results/Tests Pending at Time of Discharge: none  Discharge Medications:  Allergies as of 12/02/2022   No Known Allergies      Medication List     STOP taking these medications    amLODipine 5 MG tablet Commonly known as: NORVASC   lisinopril-hydrochlorothiazide 20-25 MG tablet Commonly known as: ZESTORETIC       TAKE these medications    acetaminophen 325 MG tablet Commonly known as: TYLENOL Take 2 tablets (650 mg total) by mouth every 6 (six) hours as needed.   amoxicillin-clavulanate 875-125 MG tablet Commonly known as: AUGMENTIN Take 1 tablet by mouth every 12 (twelve) hours.   apixaban 5 MG Tabs tablet Commonly known as: ELIQUIS Take 1 tablet (5 mg total) by mouth 2 (two) times daily.   ferrous sulfate 325 (65 FE) MG tablet Take 1 tablet (325 mg total) by mouth daily with breakfast.   methadone 10 MG tablet Commonly known as: DOLOPHINE Take 80 mg by mouth daily.  polyethylene glycol 17 g packet Commonly known as: MIRALAX / GLYCOLAX Take 17 g by mouth 2 (two) times daily.   senna 8.6 MG Tabs tablet Commonly known as: SENOKOT Take 1 tablet (8.6 mg total) by mouth daily.        Discharge Instructions: Please refer to Patient Instructions section of  EMR for full details.  Patient was counseled important signs and symptoms that should prompt return to medical care, changes in medications, dietary instructions, activity restrictions, and follow up appointments.   Follow-Up Appointments:  Follow-up Information     Alfredo Martinez, MD. Schedule an appointment as soon as possible for a visit.   Specialty: Family Medicine Why: Make an appointment ASAP for hospital follow up. Contact information: [address] [place] [address] [phone]         Tomma Lightning, MD. Go to.   Specialty: Pulmonary Disease Why: Go to the appointment they call you to schedule in 4 weeks. Let us know if you do not get a call from them. You will get a follow up chest xray at this visit. Contact information: [address] [place] [address] [phone]                 Lorayne Bender, MD 12/02/2022, 12:39 PM PGY-1, [facility] Family Medicine  I agree with the assessment and plan as documented in the resident's note.  Janeal Holmes, MD PGY-2, [facility] Health Family Medicine

## 2022-12-02 NOTE — Assessment & Plan Note (Deleted)
Hepatomegaly improved on exam. Hepatitis panel and HIV Ab negative. No other symptoms. RUQ [place] with 7 mm hypoechoic mass in the left hepatic lobe. Spoke with Radiology, they feel it is likely a benign cyst, recommend follow up ultrasound in 3 months. LFTs WNL (21/18).  - Continue to monitor for clinical signs/symptoms

## 2022-12-03 ENCOUNTER — Telehealth: Payer: Self-pay

## 2022-12-03 NOTE — Transitions of Care (Post Inpatient/ED Visit) (Signed)
   12/03/2022  Name: Stephen Lambert MRN: [id number] DOB: [birth year]  Today's TOC FU Call Status: Today's TOC FU Call Status:: Successful TOC FU Call Completed TOC FU Call Complete Date: 12/03/22 Patient's Name and Date of Birth confirmed.  Transition Care Management Follow-up Telephone Call Date of Discharge: 12/03/22 Discharge Facility: [facility]) Type of Discharge: Inpatient Admission Primary Inpatient Discharge Diagnosis:: Pneumonia How have you been since you were released from the hospital?: Better Any questions or concerns?: No  Items Reviewed: Did you receive and understand the discharge instructions provided?: Yes Medications obtained,verified, and reconciled?: Yes (Medications Reviewed) Any new allergies since your discharge?: No Dietary orders reviewed?: NA Do you have support at home?: Yes People in Home: significant other Name of Support/Comfort Primary Source: Claudell Kyle  Medications Reviewed Today: Medications Reviewed Today     Reviewed by Redge Gainer, RN (Case Manager) on 12/03/22 at 1609  Med List Status: <None>   Medication Order Taking? Sig Documenting Provider Last Dose Status Informant  acetaminophen (TYLENOL) 325 MG tablet [id number]  Take 2 tablets (650 mg total) by mouth every 6 (six) hours as needed. Evette Georges, MD  Active   amoxicillin-clavulanate (AUGMENTIN) 875-125 MG tablet [id number]  Take 1 tablet by mouth every 12 (twelve) hours. Evette Georges, MD  Active   apixaban (ELIQUIS) 5 MG TABS tablet [id number]  Take 1 tablet (5 mg total) by mouth 2 (two) times daily. Evette Georges, MD  Active   ferrous sulfate 325 (65 FE) MG tablet [id number]  Take 1 tablet (325 mg total) by mouth daily with breakfast. Evette Georges, MD  Active   methadone (DOLOPHINE) 10 MG tablet [id number] No Take 80 mg by mouth daily. [provider] 11/21/2022 Active            Med Note Antonietta Barcelona, Liberty Handy Nov 21, 2022  9:09 PM) Verified from [place]. Last dosed in clinic 11/20/22 and given 4 at home doses.   polyethylene glycol (MIRALAX / GLYCOLAX) 17 g packet [id number]  Take 17 g by mouth 2 (two) times daily. Evette Georges, MD  Active   senna (SENOKOT) 8.6 MG TABS tablet [id number]  Take 1 tablet (8.6 mg total) by mouth daily. Evette Georges, MD  Active             Home Care and Equipment/Supplies: Were Home Health Services Ordered?: No Any new equipment or medical supplies ordered?: No  Functional Questionnaire: Do you need assistance with bathing/showering or dressing?: No Do you need assistance with meal preparation?: No Do you need assistance with eating?: No Do you have difficulty maintaining continence: No Do you need assistance with getting out of bed/getting out of a chair/moving?: No Do you have difficulty managing or taking your medications?: No  Follow up appointments reviewed: PCP Follow-up appointment confirmed?: Yes Date of PCP follow-up appointment?: 12/05/22 Follow-up Provider: Family [facility] Specialist [facility] Follow-up appointment confirmed?: NA Do you need transportation to your follow-up appointment?: No Do you understand care options if your condition(s) worsen?: Yes-patient verbalized understanding  SDOH Interventions Today    Flowsheet Row Most Recent Value  SDOH Interventions   Food Insecurity Interventions Intervention Not Indicated  Transportation Interventions Intervention Not Indicated       Deidre Ala, RN RN Care Manager [facility] [phone]

## 2022-12-03 NOTE — Transitions of Care (Post Inpatient/ED Visit) (Unsigned)
   12/03/2022  Name: Stephen Lambert MRN: [id number] DOB: [birth year]  Today's TOC FU Call Status: Today's TOC FU Call Status:: Unsuccessful Call (1st Attempt) Unsuccessful Call (1st Attempt) Date: 12/03/22  Attempted to reach the patient regarding the most recent Inpatient/ED visit.  Follow Up Plan: Additional outreach attempts will be made to reach the patient to complete the Transitions of Care (Post Inpatient/ED visit) call.   Signature Karena Addison, LPN [facility] Nurse Health Advisor Direct Dial [phone]

## 2022-12-04 NOTE — Transitions of Care (Post Inpatient/ED Visit) (Signed)
   12/04/2022  Name: Stephen Lambert MRN: [id number] DOB: 1961-01-14  Today's TOC FU Call Status: Today's TOC FU Call Status:: Successful TOC FU Call Completed Unsuccessful Call (1st Attempt) Date: 12/03/22 [facility] FU Call Complete Date: 12/04/22 Patient's Name and Date of Birth confirmed.  Transition Care Management Follow-up Telephone Call Date of Discharge: 12/02/22 Discharge Facility: [facility]) Type of Discharge: Inpatient Admission Primary Inpatient Discharge Diagnosis:: pneumonia How have you been since you were released from the hospital?: Better Any questions or concerns?: No  Items Reviewed: Did you receive and understand the discharge instructions provided?: Yes Medications obtained,verified, and reconciled?: Yes (Medications Reviewed) Any new allergies since your discharge?: No Dietary orders reviewed?: Yes People in Home: significant other  Medications Reviewed Today: Medications Reviewed Today     Reviewed by Karena Addison, LPN (Licensed Practical Nurse) on 12/04/22 at 1159  Med List Status: <None>   Medication Order Taking? Sig Documenting Provider Last Dose Status Informant  acetaminophen (TYLENOL) 325 MG tablet [id number]  Take 2 tablets (650 mg total) by mouth every 6 (six) hours as needed. Evette Georges, MD  Active   amoxicillin-clavulanate (AUGMENTIN) 875-125 MG tablet [id number]  Take 1 tablet by mouth every 12 (twelve) hours. Evette Georges, MD  Active   apixaban (ELIQUIS) 5 MG TABS tablet [id number]  Take 1 tablet (5 mg total) by mouth 2 (two) times daily. Evette Georges, MD  Active   ferrous sulfate 325 (65 FE) MG tablet [id number]  Take 1 tablet (325 mg total) by mouth daily with breakfast. Evette Georges, MD  Active   methadone (DOLOPHINE) 10 MG tablet [id number] No Take 80 mg by mouth daily. [provider] 11/21/2022 Active            Med Note Antonietta Barcelona, Liberty Handy Nov 21, 2022  9:09 PM) Verified from [place]. Last dosed in clinic  11/20/22 and given 4 at home doses.   polyethylene glycol (MIRALAX / GLYCOLAX) 17 g packet [id number]  Take 17 g by mouth 2 (two) times daily. Evette Georges, MD  Active   senna (SENOKOT) 8.6 MG TABS tablet [id number]  Take 1 tablet (8.6 mg total) by mouth daily. Evette Georges, MD  Active             Home Care and Equipment/Supplies: Were Home Health Services Ordered?: NA Any new equipment or medical supplies ordered?: NA  Functional Questionnaire: Do you need assistance with bathing/showering or dressing?: No Do you need assistance with meal preparation?: No Do you need assistance with eating?: No Do you have difficulty maintaining continence: No Do you need assistance with getting out of bed/getting out of a chair/moving?: No Do you have difficulty managing or taking your medications?: No  Follow up appointments reviewed: PCP Follow-up appointment confirmed?: Yes Date of PCP follow-up appointment?: 12/05/22 Follow-up Provider: [facility] Follow-up appointment confirmed?: No Reason Specialist Follow-Up Not Confirmed: Patient has Specialist Provider Number and will Call for Appointment Do you need transportation to your follow-up appointment?: No Do you understand care options if your condition(s) worsen?: Yes-patient verbalized understanding    SIGNATURE Karena Addison, LPN [facility] Nurse Health Advisor Direct Dial [phone]

## 2022-12-05 ENCOUNTER — Ambulatory Visit (INDEPENDENT_AMBULATORY_CARE_PROVIDER_SITE_OTHER): Payer: Medicare Other | Admitting: Student

## 2022-12-05 VITALS — BP 138/84 | HR 76 | Ht 72.0 in | Wt 173.2 lb

## 2022-12-05 DIAGNOSIS — E079 Disorder of thyroid, unspecified: Secondary | ICD-10-CM | POA: Diagnosis not present

## 2022-12-05 DIAGNOSIS — D509 Iron deficiency anemia, unspecified: Secondary | ICD-10-CM | POA: Diagnosis not present

## 2022-12-05 DIAGNOSIS — R160 Hepatomegaly, not elsewhere classified: Secondary | ICD-10-CM

## 2022-12-05 DIAGNOSIS — I10 Essential (primary) hypertension: Secondary | ICD-10-CM

## 2022-12-05 DIAGNOSIS — J90 Pleural effusion, not elsewhere classified: Secondary | ICD-10-CM

## 2022-12-05 DIAGNOSIS — I2699 Other pulmonary embolism without acute cor pulmonale: Secondary | ICD-10-CM | POA: Diagnosis not present

## 2022-12-05 DIAGNOSIS — R600 Localized edema: Secondary | ICD-10-CM | POA: Diagnosis not present

## 2022-12-05 NOTE — Assessment & Plan Note (Addendum)
2 cm thyroid nodule, previously normal TSH.  Placed order for FNA biopsy.

## 2022-12-05 NOTE — Assessment & Plan Note (Addendum)
Given 30 days of Eliquis 5 mg twice daily.  Advised to continue as this may have been provoked with his pneumonia however should this be considered unprovoked he would require lifetime Eliquis.  Recommend continued discussion with PCP at follow-up appointment.

## 2022-12-05 NOTE — Assessment & Plan Note (Addendum)
Continue Augmentin for 30-day course.  He is experiencing some deconditioning understandably, I have submitted for DME order for incentive spirometry.  Given his culture was strep intermedius, recommend establishing with dentistry.  He is going to search for dentist.

## 2022-12-05 NOTE — Assessment & Plan Note (Addendum)
Appreciated on CT and right upper quadrant ultrasound.  Order submitted for recheck in 3 months.

## 2022-12-05 NOTE — Assessment & Plan Note (Addendum)
BP: 138/84 today. Well controlled. Goal of <140/90. Continue to work on healthy dietary habits and exercise.  He was discontinued on Zestoretic 20-25 mg and amlodipine 5 mg daily during hospitalization.  Did not restart during this encounter.

## 2022-12-05 NOTE — Assessment & Plan Note (Addendum)
His anemia is impressive in the 7s. Known iron deficiency, also in the situation of PE and infection.  Continue ferrous sulfate 325 mg daily.  He has never had colonoscopy, order placed for this.  Recheck CBC.

## 2022-12-05 NOTE — Patient Instructions (Addendum)
It was great to see you today! Thank you for choosing [facility] for your primary care.  Today we addressed: I am glad your breathing has improved.  Please continue Augmentin for the full course.  I have placed an order to get you an incentive spirometer.  These can also be bought for about $10 on [employer]. Anemia: We are rechecking today.  Please continue your iron.  I have also placed a referral to GI as you very much need to have a colonoscopy. I have ordered a thyroid biopsy. We need to get an ultrasound of the liver mass in 3 months. Please continue to search for a dentist. For your pulmonary embolism (blood clot in your lungs), please continue taking your Eliquis.  I will have you follow-up with your PCP Dr. Jena Gauss for further discussion of when/if to discontinue.  If you haven't already, sign up for My Chart to have easy access to your labs results, and communication with your primary care physician.  Please arrive 15 minutes before your appointment to ensure smooth check in process.  We appreciate your efforts in making this happen.  Thank you for allowing me to participate in your care, Stephen Mattocks, DO 12/05/2022, 1:44 PM PGY-3, [facility] Health Family Medicine

## 2022-12-05 NOTE — Progress Notes (Signed)
  SUBJECTIVE:   CHIEF COMPLAINT / HPI:   Presents today for hospital follow-up regarding his hospitalization from 10/3 to 10/14.  He was admitted for large right pleural effusion.  He had thoracentesis with chest tube which did not reveal any malignant cells with pleural fluid cytology.  He was treated with IV antibiotics and eventually discharged on Augmentin for prolonged course of 4 to 6 weeks.  He was noted to have a small PE initiated on heparin and transitioned to Eliquis.  Thyroid nodule with thyroid ultrasound showing 2 cm nodule.  Hepatomegaly with 7 mm hypoechoic liver mass.  Anemia of chronic disease and iron deficiency started on p.o. iron.  PCP Follow Up Recommendations: Obtain repeat RUQ [place] in 3 months to follow up hypoechoic liver mass. Obtain dental follow up given pleural cultures with Strep Intermedius  F/u BLE swelling Follow up BP and add back amlodipine and lisinopril-hydrochlorothiazide as tolerated. Repeat CBC given anemia over admission Recommend FNA biopsy outpatient.  PERTINENT  PMH / PSH: HTN, PE, tobacco use  OBJECTIVE:  BP 138/84   Pulse 76   Ht 6' (1.829 m)   Wt 173 lb 3.2 oz (78.6 kg)   SpO2 99%   BMI 23.49 kg/m  General: Well-appearing, NAD CV: 2/6 systolic murmur, RRR Pulm: Trace wheezing in upper lobes, normal WOB Extremities: BLEs with compression stockings on  ASSESSMENT/PLAN:   Assessment & Plan Pneumonia  Empyema Continue Augmentin for 30-day course.  He is experiencing some deconditioning understandably, I have submitted for DME order for incentive spirometry.  Given his culture was strep intermedius, recommend establishing with dentistry.  He is going to search for dentist. Other pulmonary embolism without acute cor pulmonale, unspecified chronicity (HCC) Given 30 days of Eliquis 5 mg twice daily.  Advised to continue as this may have been provoked with his pneumonia however should this be considered unprovoked he would require lifetime  Eliquis.  Recommend continued discussion with PCP at follow-up appointment. Iron deficiency anemia, unspecified iron deficiency anemia type His anemia is impressive in the 7s. Known iron deficiency, also in the situation of PE and infection.  Continue ferrous sulfate 325 mg daily.  He has never had colonoscopy, order placed for this.  Recheck CBC. Primary hypertension BP: 138/84 today. Well controlled. Goal of <140/90. Continue to work on healthy dietary habits and exercise.  He was discontinued on Zestoretic 20-25 mg and amlodipine 5 mg daily during hospitalization.  Did not restart during this encounter. Thyroid mass 2 cm thyroid nodule, previously normal TSH.  Placed order for FNA biopsy. Liver mass Appreciated on CT and right upper quadrant ultrasound.  Order submitted for recheck in 3 months. Lower extremity edema Improving with cessation of amlodipine and using compression stockings, continue to monitor.  Return in about 1 month (around 01/05/2023) for Eliquis discussion, pneumonia follow-up. Shelby Mattocks, DO 12/05/2022, 3:03 PM PGY-3, [facility]

## 2022-12-05 NOTE — Assessment & Plan Note (Addendum)
Improving with cessation of amlodipine and using compression stockings, continue to monitor.

## 2022-12-06 LAB — CBC
Hematocrit: 22.7 % — ABNORMAL LOW (ref 37.5–51.0)
Hemoglobin: 6.8 g/dL — CL (ref 13.0–17.7)
MCH: 25 pg — ABNORMAL LOW (ref 26.6–33.0)
MCHC: 30 g/dL — ABNORMAL LOW (ref 31.5–35.7)
MCV: 84 fL (ref 79–97)
Platelets: 795 10*3/uL — ABNORMAL HIGH (ref 150–450)
RBC: 2.72 x10E6/uL — CL (ref 4.14–5.80)
RDW: 12.9 % (ref 11.6–15.4)
WBC: 12.1 10*3/uL — ABNORMAL HIGH (ref 3.4–10.8)

## 2022-12-08 ENCOUNTER — Telehealth: Payer: Self-pay | Admitting: Student

## 2022-12-08 ENCOUNTER — Encounter (HOSPITAL_COMMUNITY): Payer: Self-pay | Admitting: Emergency Medicine

## 2022-12-08 ENCOUNTER — Other Ambulatory Visit: Payer: Self-pay

## 2022-12-08 ENCOUNTER — Emergency Department (HOSPITAL_COMMUNITY)
Admission: EM | Admit: 2022-12-08 | Discharge: 2022-12-09 | Discharge status: Against Medical Advice | Payer: Medicare Other | Attending: Emergency Medicine | Admitting: Emergency Medicine

## 2022-12-08 DIAGNOSIS — Z7901 Long term (current) use of anticoagulants: Secondary | ICD-10-CM | POA: Insufficient documentation

## 2022-12-08 DIAGNOSIS — Z5321 Procedure and treatment not carried out due to patient leaving prior to being seen by health care provider: Secondary | ICD-10-CM | POA: Diagnosis not present

## 2022-12-08 DIAGNOSIS — R0602 Shortness of breath: Secondary | ICD-10-CM | POA: Diagnosis present

## 2022-12-08 LAB — COMPREHENSIVE METABOLIC PANEL
ALT: 18 U/L (ref 0–44)
AST: 22 U/L (ref 15–41)
Albumin: 2.5 g/dL — ABNORMAL LOW (ref 3.5–5.0)
Alkaline Phosphatase: 59 U/L (ref 38–126)
Anion gap: 8 (ref 5–15)
BUN: 11 mg/dL (ref 8–23)
CO2: 27 mmol/L (ref 22–32)
Calcium: 8.3 mg/dL — ABNORMAL LOW (ref 8.9–10.3)
Chloride: 100 mmol/L (ref 98–111)
Creatinine, Ser: 1.21 mg/dL (ref 0.61–1.24)
GFR, Estimated: >60 mL/min (ref >60)
Glucose, Bld: 100 mg/dL — ABNORMAL HIGH (ref 70–99)
Potassium: 3.7 mmol/L (ref 3.5–5.1)
Sodium: 135 mmol/L (ref 135–145)
Total Bilirubin: 0.5 mg/dL (ref 0.3–1.2)
Total Protein: 7.6 g/dL (ref 6.5–8.1)

## 2022-12-08 LAB — CBC
HCT: 23.8 % — ABNORMAL LOW (ref 39.0–52.0)
Hemoglobin: 7.2 g/dL — ABNORMAL LOW (ref 13.0–17.0)
MCH: 25.4 pg — ABNORMAL LOW (ref 26.0–34.0)
MCHC: 30.3 g/dL (ref 30.0–36.0)
MCV: 84.1 fL (ref 80.0–100.0)
Platelets: 683 10*3/uL — ABNORMAL HIGH (ref 150–400)
RBC: 2.83 MIL/uL — ABNORMAL LOW (ref 4.22–5.81)
RDW: 14.6 % (ref 11.5–15.5)
WBC: 10.2 10*3/uL (ref 4.0–10.5)
nRBC: 0 % (ref 0.0–0.2)

## 2022-12-08 LAB — TYPE AND SCREEN
ABO/RH(D): O POS
Antibody Screen: NEGATIVE

## 2022-12-08 NOTE — ED Triage Notes (Signed)
Pt sent by PCP for low Hg, takes Eliquis BID. Denies seeing any blood in stools, but does endorse sob with exertion

## 2022-12-08 NOTE — Telephone Encounter (Signed)
Recent CBC with hemoglobin 6.8, critically low in comparison to 3 days prior of hemoglobin 7.6 after being discharged from hospital.  Called patient to check in on him and he notes fatigue with excess activity.  Given his multi morbid conditions and additionally that he is on Eliquis for PE during hospitalization and continued decrease in hemoglobin, recommended proceeding to ED for potential admission.  Patient was agreeable.  Anticipate admission and continued workup for anemia in addition to need for blood transfusion.  Consider colorectal cancer as potential given he has never had colonoscopy.

## 2022-12-09 ENCOUNTER — Telehealth: Payer: Self-pay

## 2022-12-09 NOTE — Telephone Encounter (Signed)
Receipt confirmed by [employer].   Beyounce Dickens C Christopherjame Carnell, RN  

## 2022-12-09 NOTE — ED Notes (Signed)
Called pt x3 for room with no response. Moved to OTF.

## 2022-12-09 NOTE — Telephone Encounter (Signed)
Community message sent to [employer] for incentive spirometer. Will await response.   Talbot Grumbling, RN

## 2022-12-09 NOTE — Telephone Encounter (Signed)
Patient calls nurse line requesting to speak with Dr. Royal Piedra.  He reports he was contacted last night and told to go to the ED for low hemoglobin and possible admission.   He reports he went as instructed, however "no one ever came to talk to me." He reports he gave blood and didn't see anyone "ever again." He reports he left the ED around 12:15am.  Hemoglobin last night in ED was 7.2 up from 6.8.   Patient denies any dizziness, lightheadedness or unusual fatigue. No syncope episodes.   Patient is unsure if he should report back to the ED.   Advised will forward to [facility] for advisement.   Precautions discussed.

## 2022-12-09 NOTE — ED Notes (Signed)
Pt called to be roomed multiple times, assumed LWBS

## 2022-12-10 ENCOUNTER — Other Ambulatory Visit: Payer: Medicare Other

## 2022-12-10 NOTE — Telephone Encounter (Signed)
Spoke with patient.   Patient rescheduled with PCP for 11/4.

## 2022-12-10 NOTE — Telephone Encounter (Signed)
Attempted to contact patient, however no answer.   Called patient x2 with no answer or option for VM.

## 2022-12-12 ENCOUNTER — Ambulatory Visit
Admission: RE | Admit: 2022-12-12 | Discharge: 2022-12-12 | Disposition: A | Discharge status: Home / Self Care | Payer: Medicare Other | Source: Ambulatory Visit | Attending: Family Medicine | Admitting: Family Medicine

## 2022-12-12 DIAGNOSIS — R160 Hepatomegaly, not elsewhere classified: Secondary | ICD-10-CM

## 2022-12-18 ENCOUNTER — Telehealth: Payer: Self-pay | Admitting: Student

## 2022-12-18 NOTE — Telephone Encounter (Signed)
**  After Hours/ Emergency Line Call**  Received a call to report that Stephen Lambert calling for concern of swelling in the lower extremities, worsening since last admission to [facility]- early 11/2022.  Patient denies any dyspnea or chest pain at this time.  He reports that he has had worsening swelling in the lower extremities that has prevented him from being able to put on his shoes or put on his pants lately.  He did have his antihypertensives discontinued on last admission for labile/soft blood pressures.  Patient is concerned about the worsening swelling, equal swelling bilaterally without redness or warmth per patient.  Recently diagnosed with PE and pleural effusion, has been taking his Eliquis and antibiotic as prescribed. Recommended that he be seen in access to care tomorrow morning for evaluation.  Red flags discussed.  Instructed to call or page again if symptoms worsen or change overnight. Will forward to ATC provider who will see in the morning, Dr. Elliot Gurney.  Alfredo Martinez, MD  PGY-3, [facility] 12/18/2022 8:44 PM

## 2022-12-19 ENCOUNTER — Ambulatory Visit (HOSPITAL_COMMUNITY)
Admission: RE | Admit: 2022-12-19 | Discharge: 2022-12-19 | Disposition: A | Discharge status: Home / Self Care | Payer: Medicare Other | Source: Ambulatory Visit | Attending: Family Medicine | Admitting: Family Medicine

## 2022-12-19 ENCOUNTER — Ambulatory Visit (INDEPENDENT_AMBULATORY_CARE_PROVIDER_SITE_OTHER): Payer: Medicare Other | Admitting: Student

## 2022-12-19 ENCOUNTER — Other Ambulatory Visit: Payer: Self-pay

## 2022-12-19 VITALS — BP 138/84 | HR 69 | Ht 72.0 in | Wt 179.4 lb

## 2022-12-19 DIAGNOSIS — R600 Localized edema: Secondary | ICD-10-CM | POA: Diagnosis present

## 2022-12-19 DIAGNOSIS — E079 Disorder of thyroid, unspecified: Secondary | ICD-10-CM

## 2022-12-19 DIAGNOSIS — I10 Essential (primary) hypertension: Secondary | ICD-10-CM

## 2022-12-19 MED ORDER — LISINOPRIL-HYDROCHLOROTHIAZIDE 20-25 MG PO TABS: 1.0000 | ORAL_TABLET | Freq: Every day | ORAL | 2 refills | Status: DC

## 2022-12-19 NOTE — Progress Notes (Signed)
Bilateral lower extremity venous duplex has been completed. Preliminary results can be found in CV Proc through chart review.  Results were faxed to Dr. Manson Passey.  12/19/22 1:08 PM Olen Cordial RVT

## 2022-12-19 NOTE — Assessment & Plan Note (Addendum)
Broad differential for worsening LE edema despite discontinuing Amlodipine. Low concerns for cardiac causes given recent Echo findings, no reported orthopnea, normal cardiopulmonary exam. At the top of the differential includes thrombosis given recent PE. Also in the differential include anemia seen in most recent CBC, nephrotic or hepatic etiology.  Although Myxedema is considered in differential given recent thyroid nodule, very low on the differential with generally normal thyroid labs recently. Will restart patient BP med which includes diuretics while obtaining lab to further to assess for possible causes. -Ordered lab for CBC, CMP -Stat bilateral doppler US ordered -Restarted Lisinopril-hydrochlorothiazide med -Encouraged continued use of compression socks -Follow up in two weeks or earlier as needed -ED precautions discussed with patient.  -Placed referral to Endocrinology for thyroid nodule

## 2022-12-19 NOTE — Progress Notes (Signed)
    SUBJECTIVE:   CHIEF COMPLAINT / HPI:   Patient is a 62 year old male presenting today for bilateral lower extremity edema. Patient report progressive worsening of LE edema over the past 2 weeks since the discharge from the hospital. Had some swelling before discharge but has increased over time and now he is having some tightness with his foot wears. Denies any chest pain, SOB or LE pain. He was recently admitted in the hospital about two weeks ago and was found to have PE with pleural effusion, pneumonia, liver mass and palpable right thyroid mass. Also noted to have significant anemia. Echo was obtained which showed EF of 60-65% with concentric left ventricular hypertrophy.He was started on anticoagulant which patient endorses good compliance.   PERTINENT  PMH / PSH: Reviewed  OBJECTIVE:   BP 138/84   Pulse 69   Ht 6' (1.829 m)   Wt 179 lb 6.4 oz (81.4 kg)   SpO2 99%   BMI 24.33 kg/m    Physical Exam General: Alert, well appearing, NAD Cardiovascular: RRR, No Murmurs, Normal S2/S2 Respiratory: CTAB, No wheezing or Rales Abdomen: No distension or tenderness Extremities: +2 BLE edema up to the knees   ASSESSMENT/PLAN:   Lower extremity edema Broad differential for worsening LE edema despite discontinuing Amlodipine. Low concerns for cardiac causes given recent Echo findings, no reported orthopnea, normal cardiopulmonary exam. At the top of the differential includes thrombosis given recent PE.Despite thyroid nodule found Also in the differential include anemia seen in most recent CBC, nephrotic or hepatic etiology.  Although Myxedema is considered in differential given recent thyroid nodule, very low on the differential with generally normal thyroid labs recently. Will restart patient BP med which includes diuretics while obtaining lab to further to assess for possible causes. -Ordered lab for CBC, CMP -Stat bilateral doppler US ordered -Restarted Lisinopril-hydrochlorothiazide  med -Encouraged continued use of compression socks -Follow up in two weeks or earlier as needed -ED precautions discussed with patient.  -Placed referral to Endocrinology for thyroid nodule    Jerre Simon, MD [facility]

## 2022-12-19 NOTE — Patient Instructions (Addendum)
Good to see you today.  For your swellings in your legs I have ordered a Doppler ultrasound to check for clots in your legs.  I have also ordered labs to check your blood count,  kidney, liver function and electrolytes level.  I have restarted you Lisinopril-HCTZ blood pressure pills.  Please follow up in 2 weeks or earlier if getting worse.  Go to the ED if you start noticing chest pain and difficulty breathing.

## 2022-12-20 LAB — COMPREHENSIVE METABOLIC PANEL
ALT: 11 [IU]/L (ref 0–44)
AST: 15 [IU]/L (ref 0–40)
Albumin: 3.2 g/dL — ABNORMAL LOW (ref 3.9–4.9)
Alkaline Phosphatase: 69 [IU]/L (ref 44–121)
BUN/Creatinine Ratio: 12 (ref 10–24)
BUN: 12 mg/dL (ref 8–27)
Bilirubin Total: 0.3 mg/dL (ref 0.0–1.2)
CO2: 27 mmol/L (ref 20–29)
Calcium: 8.7 mg/dL (ref 8.6–10.2)
Chloride: 101 mmol/L (ref 96–106)
Creatinine, Ser: 1.01 mg/dL (ref 0.76–1.27)
Globulin, Total: 3.6 g/dL (ref 1.5–4.5)
Glucose: 72 mg/dL (ref 70–99)
Potassium: 4.3 mmol/L (ref 3.5–5.2)
Sodium: 140 mmol/L (ref 134–144)
Total Protein: 6.8 g/dL (ref 6.0–8.5)
eGFR: 84 mL/min/{1.73_m2} (ref >59)

## 2022-12-20 LAB — CBC
Hematocrit: 28 % — ABNORMAL LOW (ref 37.5–51.0)
Hemoglobin: 8.5 g/dL — ABNORMAL LOW (ref 13.0–17.7)
MCH: 25.8 pg — ABNORMAL LOW (ref 26.6–33.0)
MCHC: 30.4 g/dL — ABNORMAL LOW (ref 31.5–35.7)
MCV: 85 fL (ref 79–97)
Platelets: 317 10*3/uL (ref 150–450)
RBC: 3.29 x10E6/uL — ABNORMAL LOW (ref 4.14–5.80)
RDW: 13.4 % (ref 11.6–15.4)
WBC: 6.7 10*3/uL (ref 3.4–10.8)

## 2022-12-21 ENCOUNTER — Encounter: Payer: Self-pay | Admitting: Student

## 2022-12-23 ENCOUNTER — Ambulatory Visit: Payer: Medicare Other | Admitting: Student

## 2022-12-26 ENCOUNTER — Other Ambulatory Visit (HOSPITAL_COMMUNITY): Payer: Self-pay

## 2022-12-26 ENCOUNTER — Other Ambulatory Visit (HOSPITAL_BASED_OUTPATIENT_CLINIC_OR_DEPARTMENT_OTHER): Payer: Self-pay

## 2022-12-27 ENCOUNTER — Other Ambulatory Visit (HOSPITAL_COMMUNITY): Payer: Self-pay

## 2022-12-30 ENCOUNTER — Other Ambulatory Visit: Payer: Self-pay | Admitting: Student

## 2022-12-30 ENCOUNTER — Other Ambulatory Visit (HOSPITAL_COMMUNITY): Payer: Self-pay

## 2022-12-31 ENCOUNTER — Other Ambulatory Visit (HOSPITAL_COMMUNITY): Payer: Self-pay

## 2022-12-31 MED ORDER — AMOXICILLIN-POT CLAVULANATE 875-125 MG PO TABS
1.0000 | ORAL_TABLET | Freq: Two times a day (BID) | ORAL | 0 refills | Status: AC
  Filled 2022-12-31: qty 20, 10d supply, fill #0

## 2022-12-31 MED ORDER — APIXABAN 5 MG PO TABS
5.0000 mg | ORAL_TABLET | Freq: Two times a day (BID) | ORAL | 0 refills | Status: DC
  Filled 2022-12-31: qty 60, 30d supply, fill #0

## 2022-12-31 NOTE — Telephone Encounter (Signed)
Patient calls nurse line regarding refills on Augmentin and eliquis.   Per note from Dr. Royal Piedra, patient was supposed to remain on Augmentin for 30 days. This was started during hospitalization on 12/01/22.  Patient reports that he only has two pills left. Patient is asking if he needs to continue prescription.   He reports that he is no longer having cough, fever and denies any issues with breathing.   Will forward to PCP for further advisement.   Veronda Prude, RN

## 2023-01-02 ENCOUNTER — Other Ambulatory Visit (HOSPITAL_COMMUNITY): Payer: Self-pay

## 2023-01-02 ENCOUNTER — Telehealth: Payer: Self-pay

## 2023-01-02 ENCOUNTER — Ambulatory Visit: Payer: Medicare Other | Admitting: Student

## 2023-01-02 NOTE — Telephone Encounter (Signed)
Patient calls nurse line in regards to Eliquis.  He reports no one has filled his medication. However, medication was filled on 11/12.  I called [facility] pharmacy and they report the medication is ready for pick up, however the copay with his insurance is ~400 dollars.   Patient is requesting samples in the meantime.  Will forward to pharmacy team for any assistance.

## 2023-01-07 ENCOUNTER — Encounter: Payer: Self-pay | Admitting: Student

## 2023-01-07 ENCOUNTER — Ambulatory Visit (INDEPENDENT_AMBULATORY_CARE_PROVIDER_SITE_OTHER): Payer: Medicare Other | Admitting: Student

## 2023-01-07 VITALS — BP 150/83 | HR 60 | Ht 72.0 in | Wt 174.0 lb

## 2023-01-07 DIAGNOSIS — D508 Other iron deficiency anemias: Secondary | ICD-10-CM

## 2023-01-07 DIAGNOSIS — I10 Essential (primary) hypertension: Secondary | ICD-10-CM | POA: Diagnosis not present

## 2023-01-07 LAB — POCT HEMOGLOBIN: Hemoglobin: 9.5 g/dL — AB (ref 11–14.6)

## 2023-01-07 NOTE — Assessment & Plan Note (Addendum)
BP today is elevated x 2.  Patient currently asymptomatic and endorses compliance to his lisinopril-hydrochlorothiazide pill.  Historically has had normal BP for the most part.  Via shared decision patient will keep a blood pressure logs at home to monitor ambulatory BP and follow-up in 2 weeks to reassess need for medication adjustments or ambulatory BP checks with Dr. Raymondo Band.

## 2023-01-07 NOTE — Progress Notes (Signed)
    SUBJECTIVE:   CHIEF COMPLAINT / HPI:   Patient is a 62 year old male presenting today for follow-up. Was seen 2-3 weeks ago for lower extremity edema. At the time restarted home BP of lisinopril-hydrochlorothiazide combo pill. Still have residual edema but much improved. Denies any headache, chest pain or difficulty breathing.  Anemia Follow-up for anemia. Noted to have severe anemia during recent hospitalization. Suspected to be iron deficiency/anemia of chronic disease Last hemoglobin few weeks ago was 8.5. Today he denies any lightheadedness, shortness of breath or chest pain. Of note patient has never had colonoscopy in the past.  PERTINENT  PMH / PSH: Reviewed   OBJECTIVE:   BP (!) 150/83   Pulse 60   Ht 6' (1.829 m)   Wt 174 lb (78.9 kg)   SpO2 99%   BMI 23.60 kg/m    Physical Exam General: Alert, well appearing, NAD Cardiovascular: RRR, No Murmurs, Normal S2/S2 Respiratory: CTAB, No wheezing or Rales Abdomen: No distension or tenderness Extremities: No edema on extremities     ASSESSMENT/PLAN:   HTN (hypertension) BP today is elevated x 2.  Patient currently asymptomatic and endorses compliance to his lisinopril-hydrochlorothiazide pill.  Historically has had normal BP for the most part.  Via shared decision patient will keep a blood pressure logs at home to monitor ambulatory BP and follow-up in 2 weeks to reassess need for medication adjustments or ambulatory BP checks with Dr. Raymondo Band.  Anemia Currently asymptomatic and no signs of bleeding.  Point-of-care hemoglobin today showed improvement to 9.5 from 8.5 few weeks ago.  Endorses compliance with p.o. iron intake.  Anemia suspected to be a combination of iron deficiency anemia and possible anemia of chronic disease.  Has never had colonoscopy in the past and adamant about getting colonoscopy at this time.  He plans to get his colonoscopy next year (2025).  Risk of benefit discussed with patient.    Jerre Simon, MD [facility]

## 2023-01-07 NOTE — Assessment & Plan Note (Signed)
Currently asymptomatic and no signs of bleeding.  Point-of-care hemoglobin today showed improvement to 9.5 from 8.5 few weeks ago.  Endorses compliance with p.o. iron intake.  Anemia suspected to be a combination of iron deficiency anemia and possible anemia of chronic disease.  Has never had colonoscopy in the past and adamant about getting colonoscopy at this time.  He plans to get his colonoscopy next year (2025).  Risk of benefit discussed with patient.

## 2023-01-07 NOTE — Patient Instructions (Addendum)
Good to see you today.  Your hemoglobin shows that your blood count has improved from 8.5 last time to 9.5 today  Also glad to see that your leg swelling has improved on your medication.  Your blood pressure today is elevated.  I would like you to check your blood pressures at home daily and keep a log.  Follow-up in 2 weeks lets see what your blood pressure readings are showing.

## 2023-01-09 LAB — ACID FAST CULTURE WITH REFLEXED SENSITIVITIES (MYCOBACTERIA): Acid Fast Culture: NEGATIVE

## 2023-01-13 ENCOUNTER — Encounter: Payer: Self-pay | Admitting: Student

## 2023-01-20 ENCOUNTER — Ambulatory Visit (INDEPENDENT_AMBULATORY_CARE_PROVIDER_SITE_OTHER): Payer: Medicare Other | Admitting: Student

## 2023-01-20 ENCOUNTER — Encounter: Payer: Self-pay | Admitting: Student

## 2023-01-20 VITALS — BP 137/78 | HR 65 | Ht 72.0 in | Wt 176.8 lb

## 2023-01-20 DIAGNOSIS — I10 Essential (primary) hypertension: Secondary | ICD-10-CM

## 2023-01-20 NOTE — Assessment & Plan Note (Signed)
Controlled.  BP today was normal and home blood pressure reading generally appropriate.  Currently asymptomatic and endorses delusion of lower extremity edema. -Encourage patient to continue medication as prescribed -Routine home blood pressures -Counseled on low fat/salt diet.

## 2023-01-20 NOTE — Progress Notes (Signed)
    SUBJECTIVE:   CHIEF COMPLAINT / HPI:   62 year old male with history of hypertension Presenting for BP follow-up Recently restarted his 20 mg - 25 mg lisinopril-HCTZ combo p.o. BP log shows BP range of 110-120/50-70 Patient endorses compliance to medication Bilateral lower extremity edema is much improved years. Any headaches, vision changes, shortness of breath or chest pain.   PERTINENT  PMH / PSH: Reviewed  OBJECTIVE:   BP 137/78   Pulse 65   Ht 6' (1.829 m)   Wt 176 lb 12.8 oz (80.2 kg)   SpO2 100%   BMI 23.98 kg/m    Physical Exam General: Alert, well appearing, NAD Cardiovascular: RRR, No Murmurs, Normal S2/S2 Respiratory: CTAB, No wheezing or Rales Abdomen: No distension or tenderness Extremities: No edema on extremities     ASSESSMENT/PLAN:   HTN (hypertension) Controlled.  BP today was normal and home blood pressure reading generally appropriate.  Currently asymptomatic and endorses delusion of lower extremity edema. -Encourage patient to continue medication as prescribed -Routine home blood pressures -Counseled on low fat/salt diet.     Jerre Simon, MD [facility]

## 2023-01-20 NOTE — Patient Instructions (Addendum)
Good to see you today.  Your blood pressure today was slightly high however your home blood pressure readings are normal.  Your home blood pressure readings being normal there is no need to make any adjustments to your medication currently.  Please continue to take your medications as prescribed  Happy holidays!

## 2023-01-22 LAB — MTB-RIF NAA WITH AFB CULTURE, SPUTUM: Acid Fast Culture: NEGATIVE

## 2023-01-23 ENCOUNTER — Ambulatory Visit: Payer: Medicare Other

## 2023-01-23 ENCOUNTER — Ambulatory Visit (INDEPENDENT_AMBULATORY_CARE_PROVIDER_SITE_OTHER): Payer: Medicare Other | Admitting: Nurse Practitioner

## 2023-01-23 ENCOUNTER — Encounter: Payer: Self-pay | Admitting: Nurse Practitioner

## 2023-01-23 VITALS — BP 150/80 | HR 61 | Ht 72.0 in | Wt 176.0 lb

## 2023-01-23 DIAGNOSIS — R0609 Other forms of dyspnea: Secondary | ICD-10-CM

## 2023-01-23 DIAGNOSIS — Z87891 Personal history of nicotine dependence: Secondary | ICD-10-CM | POA: Diagnosis not present

## 2023-01-23 DIAGNOSIS — J189 Pneumonia, unspecified organism: Secondary | ICD-10-CM

## 2023-01-23 DIAGNOSIS — J918 Pleural effusion in other conditions classified elsewhere: Secondary | ICD-10-CM

## 2023-01-23 DIAGNOSIS — J90 Pleural effusion, not elsewhere classified: Secondary | ICD-10-CM

## 2023-01-23 DIAGNOSIS — J869 Pyothorax without fistula: Secondary | ICD-10-CM | POA: Diagnosis not present

## 2023-01-23 DIAGNOSIS — I2699 Other pulmonary embolism without acute cor pulmonale: Secondary | ICD-10-CM

## 2023-01-23 NOTE — Progress Notes (Signed)
@Patient  ID: Stephen Lambert, male    DOB: 1961/01/29, 62 y.o.   MRN: [id number]  Chief Complaint  Patient presents with   Follow-up    Hospital F/U. Chest X-Ray today    Referring provider: Alfredo Martinez, MD  HPI: 62 year old male, former smoker followed for right sided empyema with extensive pneumonia and parapneumonic effusion.  Past medical history significant for hypertension, PE on Eliquis, systolic murmur, history of thyroid mass.  He was recently admitted from 11/21/2022 to 12/02/2022 for right sided empyema with pneumonia and parapneumonic effusion.  He was also found to have a small subsegmental PE in left lower lobe without evidence of heart strain and started on anticoagulation therapy for this.  He had initially presented with increased dyspnea on exertion that have been progressively worsening over the past several months.  CT imaging revealed a loculated right pleural effusion with thickened pleura on CTA chest and extensive right-sided airspace disease.  He had thoracentesis with minimal output.  He then had a chest tube placed 10/6 which was subsequently removed on 10/12.  Significantly elevated LDH, consistent with exudative effusion.  Initial cultures was negative but repeat grew out strep intermedius.  Cytology negative for malignancy; severe inflammatory cells.  He was treated with IV antibiotics and transition to prolonged course of antibiotics for 4 weeks upon discharge.  TEST/EVENTS:  11/21/2022 CTA chest: Multiple filling defects in distal segmental and proximal subsegmental pulmonary artery branches to left lower lobe and inferior lingula.  No evidence of right heart strain.  Mild pericardial effusion.  Well-circumscribed 1.7 x 2.6 cm right thyroid lesion.  Moderate size loculated fluid in the right hemithorax.  Multiple hypoattenuating areas within the right lower hemithorax, favored to represent loculated pleural effusion with thick pleura favoring empyema.  Opacified  atelectatic areas in the right middle lobe and right lower lobe.  Groundglass with associated smooth interlobular septal thickening mainly in the posterior segment of the right upper lobe and patchy areas throughout the left lung.  Mosaic attenuation of left lung.  Irregular pleural thickening in the right inferior hemithorax 11/22/2022 thoracentesis: 100 mm of clear, yellow fluid; AFB negative, culture negative; LDH 5203 11/24/2022 pleural fluid cytology negative for malignancy; culture with strep intermedius pansensitive 11/26/2022 CT chest without contrast: Pigtail drainage catheter within right hemithorax with near complete resolution of the loculated complex pleural effusion seen previously.  Punctate foci of gas near right pleural space compatible with indwelling drainage catheter.  Residual multilocular fluid along the major fissure and within the right anterior lateral costophrenic space.  Largest residual collection along the major fissure.  Dense areas of consolidation throughout the right lung with improved aeration.  Left chest clear 12/01/2022 CXR: Interval removal of right pleural drain.  Persistent large right pleural collection and right lower airspace disease  01/23/2023: Today-follow-up Patient presents today for follow-up with his wife. He is feeling much better. Feels like he's pretty much back to his baseline. He does have some shortness of breath with longer distances/uphill climbing but this is normal for him. It does not impact his daily activities. He is not having any cough. No fevers, chills, hemoptysis, weight loss, anorexia, night sweats. Eating and drinking well. He quit smoking earlier this year.   No Known Allergies  Immunization History  Administered Date(s) Administered   Influenza Inj Mdck Quad Pf 01/04/2017   Influenza, Quadrivalent, Recombinant, Inj, Pf 12/08/2017, 01/28/2020   Influenza,inj,Quad PF,6+ Mos 11/15/2013, 11/22/2014, 11/23/2015   PFIZER Comirnaty(Gray  Top)Covid-19 Tri-Sucrose Vaccine 09/25/2020  Past Medical History:  Diagnosis Date   AKI (acute kidney injury) (HCC) 11/21/2022   Back pain, chronic    2007 fell off truck   Empyema lung (HCC) 11/21/2022   Hernia    Rt. Groin - repaired 1997   HTN (hypertension)    Long-term current use of methadone for opiate dependence (HCC)    Marijuana abuse    Opioid abuse (HCC)    Tobacco abuse     Tobacco History: Social History   Tobacco Use  Smoking Status Former   Current packs/day: 0.00   Average packs/day: 0.5 packs/day for 30.0 years (15.0 ttl pk-yrs)   Types: Cigarettes   Quit date: 10/2022   Years since quitting: 0.2  Smokeless Tobacco Never  Tobacco Comments   quit from 2002-2007   Counseling given: Not Answered Tobacco comments: quit from 2002-2007   Outpatient Medications Prior to Visit  Medication Sig Dispense Refill   apixaban (ELIQUIS) 5 MG TABS tablet Take 1 tablet (5 mg total) by mouth 2 (two) times daily. 60 tablet 0   ferrous sulfate 325 (65 FE) MG tablet Take 1 tablet (325 mg total) by mouth daily with breakfast. 30 tablet 3   lisinopril-hydrochlorothiazide (ZESTORETIC) 20-25 MG tablet Take 1 tablet by mouth daily. 30 tablet 2   methadone (DOLOPHINE) 10 MG tablet Take 80 mg by mouth daily.     acetaminophen (TYLENOL) 325 MG tablet Take 2 tablets (650 mg total) by mouth every 6 (six) hours as needed.     polyethylene glycol (MIRALAX / GLYCOLAX) 17 g packet Take 17 g by mouth 2 (two) times daily.     senna (SENOKOT) 8.6 MG TABS tablet Take 1 tablet (8.6 mg total) by mouth daily.     No facility-administered medications prior to visit.     Review of Systems:   Constitutional: No weight loss or gain, night sweats, fevers, chills, fatigue, or lassitude. HEENT: No headaches, difficulty swallowing, tooth/dental problems, or sore throat. No sneezing, itching, ear ache, nasal congestion, or post nasal drip CV:  No chest pain, orthopnea, PND, swelling in lower  extremities, anasarca, dizziness, palpitations, syncope Resp: +shortness of breath with exertion. No excess mucus or change in color of mucus. No productive or non-productive. No hemoptysis. No wheezing.  No chest wall deformity GI:  No heartburn, indigestion, abdominal pain, nausea, vomiting, diarrhea, change in bowel habits, loss of appetite, bloody stools.  GU: No dysuria, change in color of urine, urgency or frequency.  No flank pain, no hematuria  Skin: No rash, lesions, ulcerations MSK:  No joint pain or swelling.   Neuro: No dizziness or lightheadedness.  Psych: No depression or anxiety. Mood stable.     Physical Exam:  BP (!) 150/80 (BP Location: Right Arm, Cuff Size: Normal)   Pulse 61   Ht 6' (1.829 m)   Wt 176 lb (79.8 kg)   SpO2 99%   BMI 23.87 kg/m   GEN: Pleasant, interactive, well-appearing; in no acute distress HEENT:  Normocephalic and atraumatic. PERRLA. Sclera white. Nasal turbinates pink, moist and patent bilaterally. No rhinorrhea present. Oropharynx pink and moist, without exudate or edema. No lesions, ulcerations, or postnasal drip.  NECK:  Supple w/ fair ROM. No JVD present. Normal carotid impulses w/o bruits. Thyroid symmetrical with no goiter or nodules palpated. No lymphadenopathy.   CV: RRR, no m/r/g, no peripheral edema. Pulses intact, +2 bilaterally. No cyanosis, pallor or clubbing. PULMONARY:  Unlabored, regular breathing. Diminished right breath sounds posteriorly; left lung clear A&P  w/o wheezes/rales/rhonchi. No accessory muscle use.  GI: BS present and normoactive. Soft, non-tender to palpation. No organomegaly or masses detected.  MSK: No erythema, warmth or tenderness. Cap refil <2 sec all extrem. No deformities or joint swelling noted.  Neuro: A/Ox3. No focal deficits noted.   Skin: Warm, no lesions or rashe Psych: Normal affect and behavior. Judgement and thought content appropriate.     Lab Results:  CBC    Component Value Date/Time   WBC  6.7 12/19/2022 1130   WBC 10.2 12/08/2022 2107   RBC 3.29 (L) 12/19/2022 1130   RBC 2.83 (L) 12/08/2022 2107   HGB 9.5 (A) 01/07/2023 1101   HGB 8.5 (L) 12/19/2022 1130   HCT 28.0 (L) 12/19/2022 1130   PLT 317 12/19/2022 1130   MCV 85 12/19/2022 1130   MCH 25.8 (L) 12/19/2022 1130   MCH 25.4 (L) 12/08/2022 2107   MCHC 30.4 (L) 12/19/2022 1130   MCHC 30.3 12/08/2022 2107   RDW 13.4 12/19/2022 1130   LYMPHSABS 1.0 11/22/2022 0401   MONOABS 2.5 (H) 11/22/2022 0401   EOSABS 0.2 11/22/2022 0401   BASOSABS 0.1 11/22/2022 0401    BMET    Component Value Date/Time   NA 140 12/19/2022 1130   K 4.3 12/19/2022 1130   CL 101 12/19/2022 1130   CO2 27 12/19/2022 1130   GLUCOSE 72 12/19/2022 1130   GLUCOSE 100 (H) 12/08/2022 2107   BUN 12 12/19/2022 1130   CREATININE 1.01 12/19/2022 1130   CREATININE 1.14 09/21/2014 1027   CALCIUM 8.7 12/19/2022 1130   GFRNONAA >60 12/08/2022 2107   GFRAA 72 09/10/2019 1649    BNP    Component Value Date/Time   BNP 165.1 (H) 11/21/2022 1442     Imaging:  DG Chest 2 View  Result Date: 01/23/2023 CLINICAL DATA:  Empyema. EXAM: CHEST - 2 VIEW COMPARISON:  December 01, 2022. FINDINGS: The heart size and mediastinal contours are within normal limits. Left lung is clear. Minimal right pleural effusion is noted with associated atelectasis or infiltrate. The visualized skeletal structures are unremarkable. IMPRESSION: Minimal right pleural effusion with associated atelectasis or infiltrate. Electronically Signed   By: Lupita Raider M.D.   On: 01/23/2023 16:17    Administration History     None           No data to display          No results found for: "NITRICOXIDE"      Assessment & Plan:   Parapneumonic effusion Clinically improved. He has significant radiographic improvement but still has a residual small right effusion vs thickening with atelectasis/resolving infiltrate. He has been adequately covered from an antimicrobial  standpoint. Discussed case with Dr. Francine Graven. Will plan to continue monitoring and repeat CT chest in about 6 weeks for 10-12 week follow up to ensure resolution and rule out lung mass/nodule with smoking history. If fluid increases, could consider additional thoracentesis with fluid studies. Advised patient of strict return precautions. Verbalized understanding.  Patient Instructions  Albuterol inhaler 2 puffs every 6 hours as needed for shortness of breath or wheezing. Notify if symptoms persist despite rescue inhaler/neb use. You don't have to use this unless you need it  Continue Eliquis 1 tab Twice daily. Monitor for any excessive bruising or bleeding. If you have a fall/have an injury or develop a severe headache, you need to be evaluated at the hospital while on this medication   Your chest x ray looks better but you still  have some thickening of or fluid in the lining of your right lung. We will reassess this when you follow up.   Attend pulmonary function testing 1/22 at 1030 am. Arrive 15 minutes before your scheduled test time. This will be at the [facility] location - [address]  CT chest prior to next appointment - this should be scheduled the week of 03/03/2023. Someone should contact you to schedule this   Follow up with Dr. Francine Graven 1/23 at 1130 AM at [facility]. Location. If symptoms do not improve or worsen, please contact office for sooner follow up or seek emergency care.    Pneumonia  Empyema See above.   Pulmonary embolism (HCC) Provoked PE. He will need 3-6 months of anticoagulation therapy. Compliant with Eliquis. No excessive bruising or bleeding. Understands signs/symptoms to monitor for and ED precautions while on anticoagulation.   DOE (dyspnea on exertion) Minimal with more strenuous activities. He does have hyperinflation on imaging. Given minimal symptom burden, will provide him with SABA PRN and obtain PFTs to evaluate for underlying smoking  related obstructive lung disease. Could consider controller therapy in the future. Counseled to remain smoke free.    Advised if symptoms do not improve or worsen, to please contact office for sooner follow up or seek emergency care.   I spent 45 minutes of dedicated to the care of this patient on the date of this encounter to include pre-visit review of records, face-to-face time with the patient discussing conditions above, post visit ordering of testing, clinical documentation with the electronic health record, making appropriate referrals as documented, and communicating necessary findings to members of the patients care team.  Noemi Chapel, NP 01/24/2023  Pt aware and understands NP's role.

## 2023-01-23 NOTE — Patient Instructions (Addendum)
Albuterol inhaler 2 puffs every 6 hours as needed for shortness of breath or wheezing. Notify if symptoms persist despite rescue inhaler/neb use. You don't have to use this unless you need it  Continue Eliquis 1 tab Twice daily. Monitor for any excessive bruising or bleeding. If you have a fall/have an injury or develop a severe headache, you need to be evaluated at the hospital while on this medication   Your chest x ray looks better but you still have some thickening of or fluid in the lining of your right lung. We will reassess this when you follow up.   Attend pulmonary function testing 1/22 at 1030 am. Arrive 15 minutes before your scheduled test time. This will be at the [facility] location - [address]  CT chest prior to next appointment - this should be scheduled the week of 03/03/2023. Someone should contact you to schedule this   Follow up with Dr. Francine Graven 1/23 at 1130 AM at [facility]. Location. If symptoms do not improve or worsen, please contact office for sooner follow up or seek emergency care.

## 2023-01-24 ENCOUNTER — Encounter: Payer: Self-pay | Admitting: Nurse Practitioner

## 2023-01-24 DIAGNOSIS — J189 Pneumonia, unspecified organism: Secondary | ICD-10-CM | POA: Insufficient documentation

## 2023-01-24 DIAGNOSIS — R0609 Other forms of dyspnea: Secondary | ICD-10-CM | POA: Insufficient documentation

## 2023-01-24 NOTE — Assessment & Plan Note (Addendum)
Clinically improved. He has significant radiographic improvement but still has a residual small right effusion vs thickening with atelectasis/resolving infiltrate. He has been adequately covered from an antimicrobial standpoint. Discussed case with Dr. Francine Graven. Will plan to continue monitoring and repeat CT chest in about 6 weeks for 10-12 week follow up to ensure resolution and rule out lung mass/nodule with smoking history. If fluid increases, could consider additional thoracentesis with fluid studies. Advised patient of strict return precautions. Verbalized understanding.  Patient Instructions  Albuterol inhaler 2 puffs every 6 hours as needed for shortness of breath or wheezing. Notify if symptoms persist despite rescue inhaler/neb use. You don't have to use this unless you need it  Continue Eliquis 1 tab Twice daily. Monitor for any excessive bruising or bleeding. If you have a fall/have an injury or develop a severe headache, you need to be evaluated at the hospital while on this medication   Your chest x ray looks better but you still have some thickening of or fluid in the lining of your right lung. We will reassess this when you follow up.   Attend pulmonary function testing 1/22 at 1030 am. Arrive 15 minutes before your scheduled test time. This will be at the [facility] location - [address]  CT chest prior to next appointment - this should be scheduled the week of 03/03/2023. Someone should contact you to schedule this   Follow up with Dr. Francine Graven 1/23 at 1130 AM at [facility]. Location. If symptoms do not improve or worsen, please contact office for sooner follow up or seek emergency care.

## 2023-01-24 NOTE — Assessment & Plan Note (Signed)
Provoked PE. He will need 3-6 months of anticoagulation therapy. Compliant with Eliquis. No excessive bruising or bleeding. Understands signs/symptoms to monitor for and ED precautions while on anticoagulation.

## 2023-01-24 NOTE — Assessment & Plan Note (Signed)
Minimal with more strenuous activities. He does have hyperinflation on imaging. Given minimal symptom burden, will provide him with SABA PRN and obtain PFTs to evaluate for underlying smoking related obstructive lung disease. Could consider controller therapy in the future. Counseled to remain smoke free.

## 2023-01-24 NOTE — Assessment & Plan Note (Signed)
See above

## 2023-01-27 ENCOUNTER — Other Ambulatory Visit (HOSPITAL_COMMUNITY): Payer: Self-pay

## 2023-01-28 ENCOUNTER — Other Ambulatory Visit (HOSPITAL_COMMUNITY): Payer: Self-pay

## 2023-01-28 ENCOUNTER — Other Ambulatory Visit: Payer: Self-pay | Admitting: Student

## 2023-01-28 MED ORDER — APIXABAN 5 MG PO TABS
5.0000 mg | ORAL_TABLET | Freq: Two times a day (BID) | ORAL | 0 refills | Status: DC
  Filled 2023-01-28: qty 60, 30d supply, fill #0

## 2023-01-31 ENCOUNTER — Inpatient Hospital Stay: Admission: RE | Admit: 2023-01-31 | Payer: Medicare Other | Source: Ambulatory Visit

## 2023-02-26 ENCOUNTER — Other Ambulatory Visit (HOSPITAL_COMMUNITY): Payer: Self-pay

## 2023-03-04 ENCOUNTER — Ambulatory Visit
Admission: RE | Admit: 2023-03-04 | Discharge: 2023-03-04 | Disposition: A | Discharge status: Home / Self Care | Payer: Medicare (Managed Care) | Source: Ambulatory Visit | Attending: Nurse Practitioner | Admitting: Nurse Practitioner

## 2023-03-04 DIAGNOSIS — J869 Pyothorax without fistula: Secondary | ICD-10-CM

## 2023-03-04 DIAGNOSIS — J189 Pneumonia, unspecified organism: Secondary | ICD-10-CM

## 2023-03-05 ENCOUNTER — Other Ambulatory Visit (HOSPITAL_COMMUNITY): Payer: Self-pay

## 2023-03-05 ENCOUNTER — Other Ambulatory Visit: Payer: Self-pay | Admitting: Student

## 2023-03-06 MED ORDER — APIXABAN 5 MG PO TABS
5.0000 mg | ORAL_TABLET | Freq: Two times a day (BID) | ORAL | 0 refills | Status: DC
  Filled 2023-03-06: qty 60, 30d supply, fill #0

## 2023-03-07 ENCOUNTER — Other Ambulatory Visit (HOSPITAL_COMMUNITY): Payer: Self-pay

## 2023-03-12 ENCOUNTER — Encounter (HOSPITAL_BASED_OUTPATIENT_CLINIC_OR_DEPARTMENT_OTHER): Payer: Medicare Other

## 2023-03-13 ENCOUNTER — Ambulatory Visit: Payer: Medicare Other | Admitting: Pulmonary Disease

## 2023-03-17 ENCOUNTER — Other Ambulatory Visit (HOSPITAL_COMMUNITY): Payer: Self-pay

## 2023-03-20 ENCOUNTER — Encounter (HOSPITAL_BASED_OUTPATIENT_CLINIC_OR_DEPARTMENT_OTHER): Payer: Medicare Other

## 2023-03-24 ENCOUNTER — Ambulatory Visit: Payer: Medicare Other | Admitting: Gastroenterology

## 2023-03-24 NOTE — Progress Notes (Deleted)
 HPI : Stephen Lambert is a 63 y.o. male who is referred to [place] by Alfredo Martinez, MD.           Iron/TIBC/Ferritin/ %Sat    Component Value Date/Time   IRON 10 (L) 11/22/2022 1057   TIBC 171 (L) 11/22/2022 1057   FERRITIN 453 (H) 11/22/2022 1057   IRONPCTSAT 6 (L) 11/22/2022 1057      Past Medical History:  Diagnosis Date   AKI (acute kidney injury) (HCC) 11/21/2022   Back pain, chronic    2007 fell off truck   Empyema lung (HCC) 11/21/2022   Hernia    Rt. Groin - repaired 1997   HTN (hypertension)    Long-term current use of methadone for opiate dependence (HCC)    Marijuana abuse    Opioid abuse (HCC)    Tobacco abuse      Past Surgical History:  Procedure Laterality Date   HERNIA REPAIR     1997   IR THORACENTESIS ASP PLEURAL SPACE W/IMG GUIDE  11/22/2022   LAPAROSCOPIC APPENDECTOMY N/A 11/29/2015   Procedure: APPENDECTOMY LAPAROSCOPIC;  Surgeon: Gaynelle Adu, MD;  Location: [facility] OR;  Service: General;  Laterality: N/A;   Family History  Problem Relation Age of Onset   Cancer Mother        lung cancer 1997   Hypertension Father    Diabetes Father    Social History   Tobacco Use   Smoking status: Former    Current packs/day: 0.00    Average packs/day: 0.5 packs/day for 30.0 years (15.0 ttl pk-yrs)    Types: Cigarettes    Quit date: 10/2022    Years since quitting: 0.4   Smokeless tobacco: Never   Tobacco comments:    quit from 2002-2007  Substance Use Topics   Alcohol use: Not Currently   Drug use: Not Currently    Types: Marijuana   Current Outpatient Medications  Medication Sig Dispense Refill   apixaban (ELIQUIS) 5 MG TABS tablet Take 1 tablet (5 mg total) by mouth 2 (two) times daily. 60 tablet 0   ferrous sulfate 325 (65 FE) MG tablet Take 1 tablet (325 mg total) by mouth daily with breakfast. 30 tablet 3   lisinopril-hydrochlorothiazide (ZESTORETIC) 20-25 MG tablet Take 1 tablet by mouth daily. 30 tablet 2   methadone (DOLOPHINE) 10 MG  tablet Take 80 mg by mouth daily.     No current facility-administered medications for this visit.   No Known Allergies   Review of Systems: All systems reviewed and negative except where noted in HPI.    CT Chest Wo Contrast Result Date: 03/11/2023 CLINICAL DATA:  Empyema, follow-up examination EXAM: CT CHEST WITHOUT CONTRAST TECHNIQUE: Multidetector CT imaging of the chest was performed following the standard protocol without IV contrast. RADIATION DOSE REDUCTION: This exam was performed according to the departmental dose-optimization program which includes automated exposure control, adjustment of the mA and/or kV according to patient size and/or use of iterative reconstruction technique. COMPARISON:  11/26/2022 FINDINGS: Cardiovascular: Cardiac size is within normal limits. Minimal coronary artery calcification mild pericardial thickening. No pericardial effusion. The pulmonary vascularity is normal. The thoracic aorta is of caliber. Aberrant origin of the right subclavian artery, an anatomic variant. Mediastinum/Nodes: No pathologic thoracic adenopathy. The esophagus is unremarkable Lungs/Pleura: Previously noted right pigtail pleural drainage catheter has been removed. Extensive parenchymal consolidation and superimposed loculated fissural fluid collection have resolved. There is moderate parenchymal scar noted within the right middle and lower lobes with associated  mild right-sided volume loss. There is right pleural thickening noted in keeping mild fibrothorax this but no significant fluid component identified. The lungs are otherwise clear. No pneumothorax. No pleural effusion or pleural thickening on the left. Upper Abdomen: Punctate 2 mm nonobstructing calculus within the visualized upper pole of the right kidney. No acute abnormality. Musculoskeletal: No chest wall mass or suspicious bone lesions identified. IMPRESSION: 1. Interval resolution of complex right pleural effusion and removal of  pleural drainage catheter with residual pleural thickening, parenchymal scarring, and volume loss. 2. Minimal coronary artery calcification. 3. Punctate 2 mm nonobstructing calculus within the visualized upper pole of the right kidney. Electronically Signed   By: Helyn Numbers M.D.   On: 03/11/2023 02:50    Physical Exam: There were no vitals taken for this visit. Constitutional: Pleasant,well-developed, ***male in no acute distress. HEENT: Normocephalic and atraumatic. Conjunctivae are normal. No scleral icterus. Neck supple.  Cardiovascular: Normal rate, regular rhythm.  Pulmonary/chest: Effort normal and breath sounds normal. No wheezing, rales or rhonchi. Abdominal: Soft, nondistended, nontender. Bowel sounds active throughout. There are no masses palpable. No hepatomegaly. Extremities: no edema Lymphadenopathy: No cervical adenopathy noted. Neurological: Alert and oriented to person place and time. Skin: Skin is warm and dry. No rashes noted. Psychiatric: Normal mood and affect. Behavior is normal.  CBC    Component Value Date/Time   WBC 6.7 12/19/2022 1130   WBC 10.2 12/08/2022 2107   RBC 3.29 (L) 12/19/2022 1130   RBC 2.83 (L) 12/08/2022 2107   HGB 9.5 (A) 01/07/2023 1101   HGB 8.5 (L) 12/19/2022 1130   HCT 28.0 (L) 12/19/2022 1130   PLT 317 12/19/2022 1130   MCV 85 12/19/2022 1130   MCH 25.8 (L) 12/19/2022 1130   MCH 25.4 (L) 12/08/2022 2107   MCHC 30.4 (L) 12/19/2022 1130   MCHC 30.3 12/08/2022 2107   RDW 13.4 12/19/2022 1130   LYMPHSABS 1.0 11/22/2022 0401   MONOABS 2.5 (H) 11/22/2022 0401   EOSABS 0.2 11/22/2022 0401   BASOSABS 0.1 11/22/2022 0401    CMP     Component Value Date/Time   NA 140 12/19/2022 1130   K 4.3 12/19/2022 1130   CL 101 12/19/2022 1130   CO2 27 12/19/2022 1130   GLUCOSE 72 12/19/2022 1130   GLUCOSE 100 (H) 12/08/2022 2107   BUN 12 12/19/2022 1130   CREATININE 1.01 12/19/2022 1130   CREATININE 1.14 09/21/2014 1027   CALCIUM 8.7  12/19/2022 1130   PROT 6.8 12/19/2022 1130   ALBUMIN 3.2 (L) 12/19/2022 1130   AST 15 12/19/2022 1130   ALT 11 12/19/2022 1130   ALKPHOS 69 12/19/2022 1130   BILITOT 0.3 12/19/2022 1130   GFRNONAA >60 12/08/2022 2107   GFRAA 72 09/10/2019 1649       Latest Ref Rng & Units 01/07/2023   11:01 AM 12/19/2022   11:30 AM 12/08/2022    9:07 PM  CBC EXTENDED  WBC 3.4 - 10.8 x10E3/uL  6.7  10.2   RBC 4.14 - 5.80 x10E6/uL  3.29  2.83   Hemoglobin 11 - 14.6 g/dL 9.5  8.5  7.2   HCT 40.9 - 51.0 %  28.0  23.8   Platelets 150 - 450 x10E3/uL  317  683       ASSESSMENT AND PLAN:  Alfredo Martinez, MD

## 2023-03-31 ENCOUNTER — Other Ambulatory Visit (HOSPITAL_COMMUNITY): Payer: Self-pay

## 2023-04-01 ENCOUNTER — Encounter (HOSPITAL_COMMUNITY): Payer: Self-pay

## 2023-04-01 ENCOUNTER — Other Ambulatory Visit (HOSPITAL_COMMUNITY): Payer: Self-pay

## 2023-04-04 ENCOUNTER — Other Ambulatory Visit: Payer: Self-pay | Admitting: Student

## 2023-04-04 ENCOUNTER — Other Ambulatory Visit (HOSPITAL_COMMUNITY): Payer: Self-pay

## 2023-04-04 MED ORDER — APIXABAN 5 MG PO TABS
5.0000 mg | ORAL_TABLET | Freq: Two times a day (BID) | ORAL | 1 refills | Status: AC
  Filled 2023-04-04: qty 60, 30d supply, fill #0

## 2023-04-04 MED ORDER — FERROUS SULFATE 325 (65 FE) MG PO TABS
325.0000 mg | ORAL_TABLET | Freq: Every day | ORAL | 0 refills | Status: AC
  Filled 2023-04-04: qty 30, 30d supply, fill #0

## 2023-04-08 ENCOUNTER — Other Ambulatory Visit (HOSPITAL_COMMUNITY): Payer: Self-pay

## 2023-05-13 ENCOUNTER — Other Ambulatory Visit: Payer: Self-pay | Admitting: Student

## 2023-05-13 DIAGNOSIS — I10 Essential (primary) hypertension: Secondary | ICD-10-CM

## 2023-07-29 ENCOUNTER — Encounter: Payer: Self-pay | Admitting: *Deleted

## 2023-08-08 ENCOUNTER — Other Ambulatory Visit: Payer: Self-pay | Admitting: Student

## 2023-08-08 DIAGNOSIS — I10 Essential (primary) hypertension: Secondary | ICD-10-CM

## 2023-11-05 ENCOUNTER — Other Ambulatory Visit: Payer: Self-pay

## 2023-11-05 DIAGNOSIS — I10 Essential (primary) hypertension: Secondary | ICD-10-CM

## 2023-11-06 MED ORDER — LISINOPRIL-HYDROCHLOROTHIAZIDE 20-25 MG PO TABS: 1.0000 | ORAL_TABLET | Freq: Every day | ORAL | 0 refills | Status: DC

## 2024-02-01 ENCOUNTER — Other Ambulatory Visit: Payer: Self-pay | Admitting: Family Medicine

## 2024-02-01 DIAGNOSIS — I10 Essential (primary) hypertension: Secondary | ICD-10-CM
